# Patient Record
Sex: Male | Born: 1970
Health system: Southern US, Community
[De-identification: ages and names within clinical notes are randomized; demographics above are authoritative.]

## PROBLEM LIST (undated history)

## (undated) DIAGNOSIS — R0602 Shortness of breath: Secondary | ICD-10-CM

## (undated) DIAGNOSIS — J45909 Unspecified asthma, uncomplicated: Secondary | ICD-10-CM

## (undated) DIAGNOSIS — M25511 Pain in right shoulder: Secondary | ICD-10-CM

## (undated) DIAGNOSIS — J449 Chronic obstructive pulmonary disease, unspecified: Secondary | ICD-10-CM

## (undated) DIAGNOSIS — M25512 Pain in left shoulder: Secondary | ICD-10-CM

## (undated) DIAGNOSIS — G8929 Other chronic pain: Secondary | ICD-10-CM

## (undated) HISTORY — DX: Other chronic pain: G89.29

## (undated) HISTORY — PX: HERNIA REPAIR: SHX51

## (undated) HISTORY — PX: ABDOMINAL SURGERY: SHX537

## (undated) HISTORY — DX: Shortness of breath: R06.02

## (undated) HISTORY — DX: Pain in right shoulder: M25.511

## (undated) HISTORY — DX: Pain in left shoulder: M25.512

---

## 1999-03-28 ENCOUNTER — Emergency Department (HOSPITAL_COMMUNITY): Admission: EM | Admit: 1999-03-28 | Discharge: 1999-03-28 | Payer: Self-pay | Admitting: *Deleted

## 1999-03-28 ENCOUNTER — Encounter: Payer: Self-pay | Admitting: *Deleted

## 2002-05-23 ENCOUNTER — Encounter (HOSPITAL_BASED_OUTPATIENT_CLINIC_OR_DEPARTMENT_OTHER): Payer: Self-pay | Admitting: General Surgery

## 2002-05-28 ENCOUNTER — Ambulatory Visit (HOSPITAL_COMMUNITY): Admission: RE | Admit: 2002-05-28 | Discharge: 2002-05-28 | Payer: Self-pay | Admitting: General Surgery

## 2012-03-24 ENCOUNTER — Emergency Department (HOSPITAL_COMMUNITY): Payer: Self-pay

## 2012-03-24 ENCOUNTER — Encounter (HOSPITAL_COMMUNITY): Payer: Self-pay | Admitting: Emergency Medicine

## 2012-03-24 ENCOUNTER — Emergency Department (HOSPITAL_COMMUNITY)
Admission: EM | Admit: 2012-03-24 | Discharge: 2012-03-24 | Disposition: A | Discharge status: Home / Self Care | Payer: Self-pay | Attending: Emergency Medicine | Admitting: Emergency Medicine

## 2012-03-24 DIAGNOSIS — J449 Chronic obstructive pulmonary disease, unspecified: Secondary | ICD-10-CM | POA: Insufficient documentation

## 2012-03-24 DIAGNOSIS — F17200 Nicotine dependence, unspecified, uncomplicated: Secondary | ICD-10-CM | POA: Insufficient documentation

## 2012-03-24 DIAGNOSIS — R062 Wheezing: Secondary | ICD-10-CM | POA: Insufficient documentation

## 2012-03-24 DIAGNOSIS — R05 Cough: Secondary | ICD-10-CM | POA: Insufficient documentation

## 2012-03-24 DIAGNOSIS — J4489 Other specified chronic obstructive pulmonary disease: Secondary | ICD-10-CM | POA: Insufficient documentation

## 2012-03-24 DIAGNOSIS — R059 Cough, unspecified: Secondary | ICD-10-CM | POA: Insufficient documentation

## 2012-03-24 LAB — CBC WITH DIFFERENTIAL/PLATELET
Basophils Absolute: 0.1 10*3/uL (ref 0.0–0.1)
HCT: 43.1 % (ref 39.0–52.0)
Hemoglobin: 15.1 g/dL (ref 13.0–17.0)
Lymphocytes Relative: 29 % (ref 12–46)
Monocytes Absolute: 0.6 10*3/uL (ref 0.1–1.0)
Neutro Abs: 3.6 10*3/uL (ref 1.7–7.7)
RDW: 12.8 % (ref 11.5–15.5)
WBC: 6.9 10*3/uL (ref 4.0–10.5)

## 2012-03-24 LAB — POCT I-STAT, CHEM 8
Calcium, Ion: 1.13 mmol/L (ref 1.12–1.23)
Creatinine, Ser: 1.2 mg/dL (ref 0.50–1.35)
Glucose, Bld: 93 mg/dL (ref 70–99)
Hemoglobin: 15 g/dL (ref 13.0–17.0)
Sodium: 142 mEq/L (ref 135–145)
TCO2: 25 mmol/L (ref 0–100)

## 2012-03-24 LAB — RAPID URINE DRUG SCREEN, HOSP PERFORMED
Amphetamines: NOT DETECTED
Benzodiazepines: NOT DETECTED

## 2012-03-24 LAB — POCT I-STAT TROPONIN I

## 2012-03-24 MED ORDER — IPRATROPIUM BROMIDE 0.02 % IN SOLN
0.5000 mg | Freq: Once | RESPIRATORY_TRACT | Status: AC
  Administered 2012-03-24: 0.5 mg via RESPIRATORY_TRACT
  Filled 2012-03-24: qty 2.5

## 2012-03-24 MED ORDER — ALBUTEROL SULFATE (5 MG/ML) 0.5% IN NEBU
5.0000 mg | INHALATION_SOLUTION | Freq: Once | RESPIRATORY_TRACT | Status: AC
  Administered 2012-03-24: 5 mg via RESPIRATORY_TRACT
  Filled 2012-03-24: qty 1

## 2012-03-24 MED ORDER — FLUTICASONE PROPIONATE HFA 110 MCG/ACT IN AERO: 2.0000 | INHALATION_SPRAY | Freq: Two times a day (BID) | RESPIRATORY_TRACT | Status: DC

## 2012-03-24 MED ORDER — PREDNISONE 50 MG PO TABS: 50.0000 mg | ORAL_TABLET | Freq: Every day | ORAL | Status: DC

## 2012-03-24 MED ORDER — METHYLPREDNISOLONE SODIUM SUCC 125 MG IJ SOLR
125.0000 mg | Freq: Once | INTRAMUSCULAR | Status: AC
  Administered 2012-03-24: 125 mg via INTRAVENOUS
  Filled 2012-03-24: qty 2

## 2012-03-24 MED ORDER — ALBUTEROL SULFATE HFA 108 (90 BASE) MCG/ACT IN AERS: 1.0000 | INHALATION_SPRAY | Freq: Four times a day (QID) | RESPIRATORY_TRACT | Status: DC | PRN

## 2012-03-24 NOTE — ED Provider Notes (Addendum)
History     CSN: [id number]  Arrival date & time 03/24/12  4098   First MD Initiated Contact with Patient 03/24/12 0117      Chief Complaint  Patient presents with  . Chest Pain    (Consider location/radiation/quality/duration/timing/severity/associated sxs/prior treatment) Patient is a 41 y.o. male presenting with chest pain and wheezing. The history is provided by the patient.  Chest Pain The chest pain began 3 - 5 hours ago. Chest pain occurs constantly. The chest pain is unchanged. The pain is associated with coughing. The severity of the pain is severe. The quality of the pain is described as tightness. The pain does not radiate. Primary symptoms include cough and wheezing. Pertinent negatives for primary symptoms include no fever.  Pertinent negatives for associated symptoms include no claudication, no diaphoresis and no numbness. He tried beta-agonist inhalers for the symptoms. Risk factors include male gender.  Pertinent negatives for past medical history include no MI.  Procedure history is negative for cardiac catheterization.    Wheezing  The current episode started 5 to 7 days ago. The onset was sudden. The problem occurs continuously. The problem has been gradually worsening. The problem is moderate. The symptoms are relieved by beta-agonist inhalers. Associated symptoms include chest pain, cough and wheezing. Pertinent negatives include no fever and no rhinorrhea. He was not exposed to toxic fumes. Urine output has been normal. There were no sick contacts. He has received no recent medical care.    History reviewed. No pertinent past medical history.  Past Surgical History  Procedure Date  . Abdominal surgery   . Hernia repair     History reviewed. No pertinent family history.  History  Substance Use Topics  . Smoking status: Current Every Day Smoker -- 0.5 packs/day    Types: Cigarettes  . Smokeless tobacco: Not on file  . Alcohol Use: Yes     Comment:  occasion      Review of Systems  Constitutional: Negative for fever and diaphoresis.  HENT: Negative for rhinorrhea and neck pain.   Respiratory: Positive for cough and wheezing.   Cardiovascular: Positive for chest pain. Negative for claudication.  Neurological: Negative for numbness.  All other systems reviewed and are negative.    Allergies  Review of patient's allergies indicates no known allergies.  Home Medications  No current outpatient prescriptions on file.  BP 127/80  Pulse 87  Temp 98.2 F (36.8 C)  Resp 14  SpO2 100%  Physical Exam  Constitutional: He is oriented to person, place, and time. He appears well-developed and well-nourished. No distress.  HENT:  Head: Normocephalic and atraumatic.  Mouth/Throat: Oropharynx is clear and moist.  Eyes: Conjunctivae normal are normal. Pupils are equal, round, and reactive to light.  Neck: Normal range of motion. Neck supple.  Cardiovascular: Normal rate and regular rhythm.   Pulmonary/Chest: No stridor. He has wheezes.  Abdominal: Soft. Bowel sounds are normal. There is no tenderness. There is no rebound and no guarding.  Musculoskeletal: Normal range of motion.  Neurological: He is alert and oriented to person, place, and time.  Skin: Skin is warm and dry. He is not diaphoretic.  Psychiatric: He has a normal mood and affect.    ED Course  Procedures (including critical care time)  Labs Reviewed  CBC WITH DIFFERENTIAL - Abnormal; Notable for the following:    Eosinophils Relative 9 (*)     Basophils Relative 2 (*)     All other components within normal limits  POCT I-STAT, CHEM 8  POCT I-STAT TROPONIN I  URINE RAPID DRUG SCREEN (HOSP PERFORMED)   No results found.   No diagnosis found.    MDM  PERC negative and wells 0    Date: 03/24/2012  Rate:88  Rhythm: normal sinus rhythm  QRS Axis: normal  Intervals: normal  ST/T Wave abnormalities: normal  Conduction Disutrbances: none  Narrative  Interpretation: unremarkable   In the setting of symptoms with negative EKG and 2 negative troponins ACS excluded.  Sx consistent with COPD exacerbation.  Stop tobacco and marijiuana.  Will d/c with inhalers and steroids.  Return for fevers, chest pain shortness of breath or any concerns.  Patient states he feels improved post nebs.       Jasmine Awe, MD 03/24/12 0244  Krystalle Pilkington K Nailyn Dearinger-Rasch, MD 03/24/12 [phone]

## 2012-03-24 NOTE — ED Notes (Signed)
Reports having CP described as tightness since Tuesday has gotten increasingly worse; reports SOB, a/w n/v/diaphoresi

## 2016-02-23 ENCOUNTER — Emergency Department (HOSPITAL_COMMUNITY): Payer: Self-pay

## 2016-02-23 ENCOUNTER — Encounter (HOSPITAL_COMMUNITY): Payer: Self-pay

## 2016-02-23 ENCOUNTER — Emergency Department (HOSPITAL_COMMUNITY)
Admission: EM | Admit: 2016-02-23 | Discharge: 2016-02-23 | Disposition: A | Discharge status: Home / Self Care | Payer: Self-pay | Attending: Emergency Medicine | Admitting: Emergency Medicine

## 2016-02-23 DIAGNOSIS — F17210 Nicotine dependence, cigarettes, uncomplicated: Secondary | ICD-10-CM | POA: Insufficient documentation

## 2016-02-23 DIAGNOSIS — J45901 Unspecified asthma with (acute) exacerbation: Secondary | ICD-10-CM | POA: Insufficient documentation

## 2016-02-23 MED ORDER — ALBUTEROL SULFATE (2.5 MG/3ML) 0.083% IN NEBU
5.0000 mg | INHALATION_SOLUTION | Freq: Once | RESPIRATORY_TRACT | Status: AC
  Administered 2016-02-23: 5 mg via RESPIRATORY_TRACT
  Filled 2016-02-23: qty 6

## 2016-02-23 MED ORDER — ALBUTEROL SULFATE HFA 108 (90 BASE) MCG/ACT IN AERS
1.0000 | INHALATION_SPRAY | Freq: Once | RESPIRATORY_TRACT | Status: AC
  Administered 2016-02-23: 1 via RESPIRATORY_TRACT
  Filled 2016-02-23: qty 6.7

## 2016-02-23 NOTE — ED Triage Notes (Addendum)
Pt c/o SOB, productive cough, and nasal congestion x 5 days.  Pain score 7/10.  Pt c/o chest tightness w/ coughing and movement.  Pt has not been taking anything for symptoms.  Hx of smoking.      Pt reports similar symptoms previously.  Sts "they gave me a breathing treatment and I felt better.  This seems to happen w/ weather changes."

## 2016-02-23 NOTE — Discharge Instructions (Signed)
Please read attached information. If you experience any new or worsening signs or symptoms please return to the emergency room for evaluation. Please follow-up with your primary care provider or specialist as discussed. Please use medication prescribed only as directed and discontinue taking if you have any concerning signs or symptoms.   °

## 2016-02-23 NOTE — ED Provider Notes (Signed)
[facility]-EMERGENCY DEPT Provider Note   CSN: [id number] Arrival date & time: 02/23/16  1303  By signing my name below, I, [name], attest that this documentation has been prepared under the direction and in the presence of Eyvonne Mechanic, PA-C  Electronically Signed: [name], ED Scribe. 02/23/16. 1:54 PM.    History   Chief Complaint Chief Complaint  Patient presents with  . Shortness of Breath    The history is provided by the patient. No language interpreter was used.   HPI Comments: LADAMIEN Aaron [name] is a 45 y.o. male who presents to the Emergency Department complaining of intermittent, moderate, gradual improving, shortness of breath x 5 days. He notes associated intermittent cough, rhinorrhea, subjective fever, and left sided chest tightness. Per family member, pt had an eyelash in his eye on Thursday. He notes that the eyelash started causing irritation and pt began to start coughing. Pt received breathing treatment to alleviate his symptoms with relief. He is a smoker of about 20 years. Denies abdominal pain, legs swelling, hx of DVT, or abdominal surgeries.  History reviewed. No pertinent past medical history.  There are no active problems to display for this patient.   Past Surgical History:  Procedure Laterality Date  . ABDOMINAL SURGERY    . HERNIA REPAIR         Home Medications    Prior to Admission medications   Medication Sig Start Date End Date Taking? Authorizing Provider  albuterol (PROVENTIL HFA;VENTOLIN HFA) 108 (90 BASE) MCG/ACT inhaler Inhale 1-2 puffs into the lungs every 6 (six) hours as needed for wheezing. 03/24/12   April Palumbo, MD  fluticasone (FLOVENT HFA) 110 MCG/ACT inhaler Inhale 2 puffs into the lungs 2 (two) times daily. 03/24/12   April Palumbo, MD  predniSONE (DELTASONE) 50 MG tablet Take 1 tablet (50 mg total) by mouth daily. 03/24/12   Cy Blamer, MD    Family History History reviewed. No pertinent family  history.  Social History Social History  Substance Use Topics  . Smoking status: Current Every Day Smoker    Packs/day: 0.50    Types: Cigarettes  . Smokeless tobacco: Never Used  . Alcohol use Yes     Comment: occasion     Allergies   Review of patient's allergies indicates no known allergies.   Review of Systems Review of Systems  Constitutional: Positive for fever (subjective).  HENT: Positive for rhinorrhea.   Respiratory: Positive for cough and shortness of breath.   Cardiovascular: Positive for chest pain (left sided chest tightness). Negative for leg swelling.  Gastrointestinal: Negative for abdominal pain.  All other systems reviewed and are negative.    Physical Exam Updated Vital Signs BP 148/77 (BP Location: Right Arm)   Pulse 86   Temp 98.4 F (36.9 C) (Oral)   Resp 18   SpO2 97%   Physical Exam  Constitutional: He is oriented to person, place, and time. He appears well-developed and well-nourished.  HENT:  Head: Normocephalic.  Eyes: EOM are normal.  Neck: Normal range of motion.  Cardiovascular: Normal rate.   Pulmonary/Chest: Effort normal.  Abdominal: He exhibits no distension.  Musculoskeletal: Normal range of motion.  Neurological: He is alert and oriented to person, place, and time.  Psychiatric: He has a normal mood and affect.  Nursing note and vitals reviewed.    ED Treatments / Results  DIAGNOSTIC STUDIES: Oxygen Saturation is 98% on RA, normal by my interpretation.  COORDINATION OF CARE:  1:49 PM Discussed treatment plan  with pt at bedside and pt agreed to plan.  Labs (all labs ordered are listed, but only abnormal results are displayed) Labs Reviewed - No data to display  EKG  EKG Interpretation None       Radiology Dg Chest 2 View  Result Date: 02/23/2016 CLINICAL DATA:  Shortness of breath, smoker EXAM: CHEST  2 VIEW COMPARISON:  03/24/2012 FINDINGS: The heart size and mediastinal contours are within normal limits.  Both lungs are clear. The visualized skeletal structures are unremarkable. Mild hyperinflation. IMPRESSION: No active cardiopulmonary disease.  Mild hyperinflation. Electronically Signed   By: Natasha MeadLiviu  Pop M.D.   On: 02/23/2016 13:48    Procedures Procedures (including critical care time)  Medications Ordered in ED Medications  albuterol (PROVENTIL) (2.5 MG/3ML) 0.083% nebulizer solution 5 mg (5 mg Nebulization Given 02/23/16 1331)  albuterol (PROVENTIL HFA;VENTOLIN HFA) 108 (90 Base) MCG/ACT inhaler 1-2 puff (1 puff Inhalation Given 02/23/16 1404)     Initial Impression / Assessment and Plan / ED Course  I have reviewed the triage vital signs and the nursing notes.  Pertinent labs & imaging results that were available during my care of the patient were reviewed by me and considered in my medical decision making (see chart for details).  Clinical Course      Final Clinical Impressions(s) / ED Diagnoses   Final diagnoses:  Mild asthma with exacerbation, unspecified whether persistent   Labs:   Imaging: DG Chest 2 View  Consults:  Therapeutics: Albuterol treatment  Discharge Meds: albuterol inhaler  Assessment/Plan: Patient presents with complaints of shortness of breath. Patient's lungs clear on my exam, he is not tachypnea, and reports no shortness of breath after breathing treatment. Patient has no infectious etiology, as no significant findings that would indicate DVT PE, or any significant life-threatening etiology. Pt was advised to stop smoking. Informed to return if his symptoms get worse.  I personally performed the services described in this documentation, which was scribed in my presence. The recorded information has been reviewed and is accurate.   New Prescriptions Discharge Medication List as of 02/23/2016  1:55 PM       Eyvonne MechanicJeffrey Tyreesha Maharaj, PA-C 02/23/16 1553    Nira ConnPedro Eduardo Cardama, MD 02/23/16 2150

## 2016-02-25 ENCOUNTER — Encounter (HOSPITAL_COMMUNITY): Payer: Self-pay | Admitting: Emergency Medicine

## 2016-02-25 ENCOUNTER — Emergency Department (HOSPITAL_COMMUNITY): Payer: Self-pay

## 2016-02-25 ENCOUNTER — Emergency Department (HOSPITAL_COMMUNITY)
Admission: EM | Admit: 2016-02-25 | Discharge: 2016-02-25 | Disposition: A | Discharge status: Home / Self Care | Payer: Self-pay | Attending: Emergency Medicine | Admitting: Emergency Medicine

## 2016-02-25 DIAGNOSIS — R05 Cough: Secondary | ICD-10-CM | POA: Insufficient documentation

## 2016-02-25 DIAGNOSIS — R000 Tachycardia, unspecified: Secondary | ICD-10-CM | POA: Insufficient documentation

## 2016-02-25 DIAGNOSIS — R0602 Shortness of breath: Secondary | ICD-10-CM | POA: Insufficient documentation

## 2016-02-25 DIAGNOSIS — R062 Wheezing: Secondary | ICD-10-CM

## 2016-02-25 DIAGNOSIS — J45909 Unspecified asthma, uncomplicated: Secondary | ICD-10-CM | POA: Insufficient documentation

## 2016-02-25 DIAGNOSIS — F17210 Nicotine dependence, cigarettes, uncomplicated: Secondary | ICD-10-CM | POA: Insufficient documentation

## 2016-02-25 DIAGNOSIS — R0981 Nasal congestion: Secondary | ICD-10-CM | POA: Insufficient documentation

## 2016-02-25 LAB — CBC WITH DIFFERENTIAL/PLATELET
Basophils Absolute: 0.1 10*3/uL (ref 0.0–0.1)
Basophils Relative: 1 %
EOS ABS: 0.6 10*3/uL (ref 0.0–0.7)
EOS PCT: 9 %
HCT: 46.9 % (ref 39.0–52.0)
HEMOGLOBIN: 16.1 g/dL (ref 13.0–17.0)
LYMPHS ABS: 2.2 10*3/uL (ref 0.7–4.0)
LYMPHS PCT: 32 %
MCH: 31.4 pg (ref 26.0–34.0)
MCHC: 34.3 g/dL (ref 30.0–36.0)
MCV: 91.4 fL (ref 78.0–100.0)
MONOS PCT: 10 %
Monocytes Absolute: 0.7 10*3/uL (ref 0.1–1.0)
Neutro Abs: 3.3 10*3/uL (ref 1.7–7.7)
Neutrophils Relative %: 48 %
Platelets: 211 10*3/uL (ref 150–400)
RBC: 5.13 MIL/uL (ref 4.22–5.81)
RDW: 13.1 % (ref 11.5–15.5)
WBC: 6.8 10*3/uL (ref 4.0–10.5)

## 2016-02-25 LAB — BASIC METABOLIC PANEL
ANION GAP: 9 (ref 5–15)
BUN: 14 mg/dL (ref 6–20)
CALCIUM: 9.5 mg/dL (ref 8.9–10.3)
CO2: 27 mmol/L (ref 22–32)
CREATININE: 1.04 mg/dL (ref 0.61–1.24)
Chloride: 105 mmol/L (ref 101–111)
GLUCOSE: 100 mg/dL — AB (ref 65–99)
Potassium: 3.7 mmol/L (ref 3.5–5.1)
Sodium: 141 mmol/L (ref 135–145)

## 2016-02-25 LAB — D-DIMER, QUANTITATIVE: D-Dimer, Quant: <0.27 ug/mL-FEU (ref 0.00–0.50)

## 2016-02-25 LAB — TROPONIN I: Troponin I: <0.03 ng/mL (ref <0.03)

## 2016-02-25 MED ORDER — ALBUTEROL SULFATE (2.5 MG/3ML) 0.083% IN NEBU
5.0000 mg | INHALATION_SOLUTION | Freq: Once | RESPIRATORY_TRACT | Status: AC
  Administered 2016-02-25: 5 mg via RESPIRATORY_TRACT

## 2016-02-25 MED ORDER — ALBUTEROL SULFATE (2.5 MG/3ML) 0.083% IN NEBU
INHALATION_SOLUTION | RESPIRATORY_TRACT | Status: AC
  Filled 2016-02-25: qty 6

## 2016-02-25 MED ORDER — PREDNISONE 20 MG PO TABS: 40.0000 mg | ORAL_TABLET | Freq: Every day | ORAL | 0 refills | Status: DC

## 2016-02-25 MED ORDER — ALBUTEROL (5 MG/ML) CONTINUOUS INHALATION SOLN
10.0000 mg/h | INHALATION_SOLUTION | RESPIRATORY_TRACT | Status: DC
  Administered 2016-02-25: 10 mg/h via RESPIRATORY_TRACT
  Filled 2016-02-25: qty 20

## 2016-02-25 MED ORDER — IPRATROPIUM BROMIDE 0.02 % IN SOLN
0.5000 mg | Freq: Once | RESPIRATORY_TRACT | Status: AC
  Administered 2016-02-25: 0.5 mg via RESPIRATORY_TRACT
  Filled 2016-02-25: qty 2.5

## 2016-02-25 MED ORDER — PREDNISONE 20 MG PO TABS
60.0000 mg | ORAL_TABLET | Freq: Once | ORAL | Status: AC
  Administered 2016-02-25: 60 mg via ORAL
  Filled 2016-02-25: qty 3

## 2016-02-25 NOTE — Discharge Instructions (Signed)
You need to follow up with primary care doctor. Please return to the ED if you develop chest pain, worsening shortness of breath, fever or for any other reason.

## 2016-02-25 NOTE — ED Triage Notes (Signed)
Pt is c/o shortness of breath and wheezing. Resp rate 28 and irregular.  Pt reports that he started wheezing at home. Took multiple breathing treatment at home, reports no decrease in wheezing. Currently has bilateral expiratory wheezing, anterior and posterior. Breathing treatment in progress.

## 2016-02-25 NOTE — Progress Notes (Signed)
EDCM spoke to patient at bedside. Patient confirms he does not have a pcp or insurance living in [place].  EDCM provided patient with contact information to [facility], informed patient of services there and walk in times.  EDCM also provided patient with list of pcps who accept self pay patients, list of discount pharmacies and websites needymeds.org and GoodRX.com for medication assistance, phone number to inquire about the orange card, phone number to inquire about Medicaid, phone number to inquire about the Affordable Care Act, financial resources in the community such as local churches, [employer], urban ministries, and dental assistance for uninsured patients.  Patient thankful for resources.  No further EDCM needs at this time. 

## 2016-02-25 NOTE — ED Notes (Signed)
Ambulated pt greater than 40 ft. Sats dropped for less than a second to 87% however question that it was a poor wave form vs true desat. Pt otherwise maintained a sat of 96% and pt denied SOB with activity. PA made aware and requesting the pt ambulate again.

## 2016-02-25 NOTE — ED Notes (Signed)
Bed: [facility] Expected date:  Expected time:  Means of arrival:  Comments: 

## 2016-02-25 NOTE — ED Provider Notes (Signed)
[facility]PT Provider Note   CSN: [id number] Arrival date & time: 02/25/16  1620     History   Chief Complaint Chief Complaint  Patient presents with  . Shortness of Breath    recurrent shortness of breath    HPI Aaron [name] is a 45 y.o. male.  45 year old African-American male with no significant past medical history presents to the ED today with wheezing. Patient states that he was at work today and started having shortness of breath and wheezing. He used his albuterol inhaler at home alone relief. Patient was seen approximately 3 days ago in ED for same. He was diagnosed with asthma exacerbation and given a albuterol inhaler. He was instructed to follow up with primary care doctor. Patient states that he does not have a PCP and has not seen anybody for his asthma. Patient states that when he was seen 3 days ago he had rhinorrhea, cough, congestion that time. However patient states that his uri symptoms have improved slightly. Still reports a productive cough with small amount of white mucus. States it seems hard to catch a breath.  Patient denies any fever, chills, headache, lightheadedness, dizziness, cp, abd pain, nausea, emesis, urinary signs, change in bowel habits, numbness/tingling. Patient is a half a pack a day smoker. He has never diagnosed with asthma or any other lung disease.      History reviewed. No pertinent past medical history.  There are no active problems to display for this patient.   Past Surgical History:  Procedure Laterality Date  . ABDOMINAL SURGERY    . HERNIA REPAIR         Home Medications    Prior to Admission medications   Medication Sig Start Date End Date Taking? Authorizing Provider  albuterol (PROVENTIL HFA;VENTOLIN HFA) 108 (90 BASE) MCG/ACT inhaler Inhale 1-2 puffs into the lungs every 6 (six) hours as needed for wheezing. Patient not taking: Reported on 02/25/2016 03/24/12   April Palumbo, MD  fluticasone [facility] HFA)  110 MCG/ACT inhaler Inhale 2 puffs into the lungs 2 (two) times daily. Patient not taking: Reported on 02/25/2016 03/24/12   April Palumbo, MD  predniSONE (DELTASONE) 20 MG tablet Take 2 tablets (40 mg total) by mouth daily with breakfast. 02/25/16   Rise Mu, PA-C    Family History Family History  Problem Relation Age of Onset  . Diabetes Mother   . Hypertension Father     Social History Social History  Substance Use Topics  . Smoking status: Current Every Day Smoker    Packs/day: 0.50    Types: Cigarettes  . Smokeless tobacco: Never Used  . Alcohol use Yes     Comment: occasion     Allergies   Review of patient's allergies indicates no known allergies.   Review of Systems Review of Systems  Constitutional: Negative for chills and fever.  HENT: Positive for congestion (mild). Negative for rhinorrhea and sore throat.   Eyes: Negative.   Respiratory: Positive for cough (mild), shortness of breath and wheezing.   Cardiovascular: Negative for chest pain, palpitations and leg swelling.  Gastrointestinal: Negative for abdominal pain, diarrhea, nausea and vomiting.  Genitourinary: Negative.   Musculoskeletal: Negative.   Skin: Negative.   Neurological: Negative for dizziness, syncope, weakness, light-headedness, numbness and headaches.  All other systems reviewed and are negative.    Physical Exam Updated Vital Signs BP 123/73 (BP Location: Left Arm)   Pulse 119   Temp 98.2 F (36.8 C)   Resp 20  Wt 81.6 kg   SpO2 94%   Physical Exam  Constitutional: He appears well-developed and well-nourished. No distress.  HENT:  Head: Normocephalic and atraumatic.  Right Ear: Tympanic membrane, external ear and ear canal normal.  Left Ear: Tympanic membrane, external ear and ear canal normal.  Nose: Nose normal. Right sinus exhibits no maxillary sinus tenderness and no frontal sinus tenderness. Left sinus exhibits no maxillary sinus tenderness and no frontal sinus  tenderness.  Mouth/Throat: Uvula is midline, oropharynx is clear and moist and mucous membranes are normal. No oropharyngeal exudate, posterior oropharyngeal edema or posterior oropharyngeal erythema.  Eyes: Conjunctivae are normal. Right eye exhibits no discharge. Left eye exhibits no discharge. No scleral icterus.  Neck: Normal range of motion. Neck supple. No thyromegaly present.  Cardiovascular: Normal rate, regular rhythm, normal heart sounds and intact distal pulses.  Exam reveals no gallop and no friction rub.   No murmur heard. Pulmonary/Chest: Effort normal. No tachypnea. No respiratory distress. He has no decreased breath sounds. He has wheezes (inspiratory and expiratory wheezes). He has no rhonchi. He has no rales.  Abdominal: Soft. Bowel sounds are normal. He exhibits no distension. There is no tenderness. There is no rebound and no guarding.  Musculoskeletal: Normal range of motion.  Lymphadenopathy:    He has no cervical adenopathy.  Neurological: He is alert.  Skin: Skin is warm and dry. Capillary refill takes less than 2 seconds.  Nursing note and vitals reviewed.    ED Treatments / Results  Labs (all labs ordered are listed, but only abnormal results are displayed) Labs Reviewed  BASIC METABOLIC PANEL - Abnormal; Notable for the following:       Result Value   Glucose, Bld 100 (*)    All other components within normal limits  CBC WITH DIFFERENTIAL/PLATELET  D-DIMER, QUANTITATIVE (NOT AT [facility])  TROPONIN I    EKG  EKG Interpretation None       Radiology Dg Chest 2 View  Result Date: 02/25/2016 CLINICAL DATA:  Shortness of breath.  Chest tightness. EXAM: CHEST  2 VIEW COMPARISON:  02/23/2016.  03/24/2012. FINDINGS: Mediastinum and hilar structures are normal. Lungs are clear. Heart size normal. Left base pleural thickening noted consistent with scarring. No pneumothorax. IMPRESSION: Left base pleural thickening noted consistent with scarring. Electronically  Signed   By: Maisie Fus  Register   On: 02/25/2016 16:57    Procedures Procedures (including critical care time)  Medications Ordered in ED Medications  albuterol (PROVENTIL,VENTOLIN) solution continuous neb (0 mg/hr Nebulization Stopped 02/25/16 1915)  albuterol (PROVENTIL) (2.5 MG/3ML) 0.083% nebulizer solution 5 mg (5 mg Nebulization Given 02/25/16 1637)  ipratropium (ATROVENT) nebulizer solution 0.5 mg (0.5 mg Nebulization Given 02/25/16 1805)  predniSONE (DELTASONE) tablet 60 mg (60 mg Oral Given 02/25/16 1752)     Initial Impression / Assessment and Plan / ED Course  I have reviewed the triage vital signs and the nursing notes.  Pertinent labs & imaging results that were available during my care of the patient were reviewed by me and considered in my medical decision making (see chart for details).  Clinical Course  Patient ambulated in ED with O2 saturations maintained >90, no current signs of respiratory distress. I walked patient myself and spo2 95-98%. His resp were 18-20. Patient was slightly tachycardic at 105-110 but he just finished is albuterol neb which would explain his tachycardia. D-dimer and troponin was negative. Patient is without any cp. Heart pathway score is 1. EKG with poor R wave progression, but  no acute changes as reviewed by Dr. Fayrene FearingJames. Troponin was negative. Low suspicion for ACS. CXR was without any infiltrate. Mild scarring noted in the left lower lobe. No acute changes. No leukocytosis. Patient is afebrile and low suspicion for infectious process at this time. Lung exam improved after nebulizer treatment. Prednisone given in the ED and pt will bd dc with 5 day burst. Pt states they are breathing at baseline. Pt has been instructed to continue using prescribed medications and to speak with PCP about today's exacerbation and further workup. I have given strict return precautions to the ED. I have encouraged him to continue to use his albuterol inhaler at home. He needs  to follow up with a pcp concerning wheezing and ekg. Patient verbalized understanding with plan of care. He is hemodynamically stable and will be discharged home in NAD with stable vs. I have counseled patient on importance of smoking cessation.      Final Clinical Impressions(s) / ED Diagnoses   Final diagnoses:  SOB (shortness of breath)  Wheezing    New Prescriptions New Prescriptions   PREDNISONE (DELTASONE) 20 MG TABLET    Take 2 tablets (40 mg total) by mouth daily with breakfast.     Rise MuKenneth T Leaphart, PA-C 02/26/16 [id number]    Rolland PorterMark James, MD 03/07/16 1556

## 2017-01-09 ENCOUNTER — Encounter (HOSPITAL_COMMUNITY): Payer: Self-pay

## 2017-01-09 ENCOUNTER — Emergency Department (HOSPITAL_COMMUNITY): Payer: Self-pay

## 2017-01-09 ENCOUNTER — Emergency Department (HOSPITAL_COMMUNITY)
Admission: EM | Admit: 2017-01-09 | Discharge: 2017-01-09 | Disposition: A | Discharge status: Home / Self Care | Payer: Self-pay | Attending: Emergency Medicine | Admitting: Emergency Medicine

## 2017-01-09 DIAGNOSIS — F17210 Nicotine dependence, cigarettes, uncomplicated: Secondary | ICD-10-CM | POA: Insufficient documentation

## 2017-01-09 DIAGNOSIS — Z79899 Other long term (current) drug therapy: Secondary | ICD-10-CM | POA: Insufficient documentation

## 2017-01-09 DIAGNOSIS — J45901 Unspecified asthma with (acute) exacerbation: Secondary | ICD-10-CM | POA: Insufficient documentation

## 2017-01-09 HISTORY — DX: Unspecified asthma, uncomplicated: J45.909

## 2017-01-09 MED ORDER — ALBUTEROL SULFATE HFA 108 (90 BASE) MCG/ACT IN AERS
1.0000 | INHALATION_SPRAY | Freq: Four times a day (QID) | RESPIRATORY_TRACT | Status: DC
  Administered 2017-01-09: 2 via RESPIRATORY_TRACT
  Filled 2017-01-09: qty 6.7

## 2017-01-09 MED ORDER — IPRATROPIUM-ALBUTEROL 0.5-2.5 (3) MG/3ML IN SOLN
3.0000 mL | Freq: Once | RESPIRATORY_TRACT | Status: AC
  Administered 2017-01-09: 3 mL via RESPIRATORY_TRACT
  Filled 2017-01-09: qty 3

## 2017-01-09 MED ORDER — IPRATROPIUM-ALBUTEROL 0.5-2.5 (3) MG/3ML IN SOLN
3.0000 mL | Freq: Once | RESPIRATORY_TRACT | Status: AC
Start: 2017-01-09 — End: 2017-01-09
  Administered 2017-01-09: 3 mL via RESPIRATORY_TRACT
  Filled 2017-01-09: qty 3

## 2017-01-09 MED ORDER — PREDNISONE 20 MG PO TABS: 40.0000 mg | ORAL_TABLET | Freq: Every day | ORAL | 0 refills | Status: AC

## 2017-01-09 MED ORDER — PREDNISONE 20 MG PO TABS
40.0000 mg | ORAL_TABLET | Freq: Once | ORAL | Status: AC
  Administered 2017-01-09: 40 mg via ORAL
  Filled 2017-01-09: qty 2

## 2017-01-09 NOTE — ED Triage Notes (Signed)
Patient presents with c/o asthma exacerbation. Patient reports starting to feel short of breath 2 days ago. Patient states he has been using his home inhaler and it has not made the shortness of breath go away. Patient endorses a productive cough with clear sputum. Denies nausea/vomiting/fever/chills.

## 2017-01-09 NOTE — Discharge Instructions (Signed)
No signs of pneumonia on your chest x-ray  Please take albuterol every 6hrs as needed for wheezing/shortness of breath.   Please fill prescription for prednisone and take 2 tablets once a day for the next 4 days.   Please return to the ER if you have worsening shortness of breath or difficulty breathing

## 2017-01-09 NOTE — ED Provider Notes (Signed)
[facility]-EMERGENCY DEPT Provider Note   CSN: [id number] Arrival date & time: 01/09/17  1108     History   Chief Complaint Chief Complaint  Patient presents with  . Asthma    HPI Aaron [name] is a 46 y.o. male.  HPI   Aaron [name] is a 46 year old male with a history of tobacco use (25pk/yrs), asthma who presents to the emergency department for evaluation of shortness of breath for the last 4 days. He states that he typically uses his albuterol inhaler once a week, mostly using it when he exercises. He works as a Administratorlandscaper and noticed that 4 days ago he was getting short of breath with increased wheezing and cough as he was working. Since that time he has had to use his albuterol inhaler 6-10 times a day noting only short term relief with the inhaler. He has also had difficulty sleeping, noting that the cough is worse when he lies flat. His cough is productive with clear/white sputum, no hemoptysis. He states that his last asthma exacerbation was about 9 months ago. He thinks that his asthma has gotten worse due to recent cold weather and the pollen from landscaping. He denies fever, chest pain, leg swelling, rhinorrhea, sore throat.   Past Medical History:  Diagnosis Date  . Asthma     There are no active problems to display for this patient.   Past Surgical History:  Procedure Laterality Date  . ABDOMINAL SURGERY    . HERNIA REPAIR         Home Medications    Prior to Admission medications   Medication Sig Start Date End Date Taking? Authorizing Provider  albuterol (PROVENTIL HFA;VENTOLIN HFA) 108 (90 BASE) MCG/ACT inhaler Inhale 1-2 puffs into the lungs every 6 (six) hours as needed for wheezing. 03/24/12  Yes Palumbo, April, MD  fluticasone (FLOVENT HFA) 110 MCG/ACT inhaler Inhale 2 puffs into the lungs 2 (two) times daily. Patient not taking: Reported on 02/25/2016 03/24/12   Palumbo, April, MD  predniSONE (DELTASONE) 20 MG tablet Take 2 tablets (40 mg total) by mouth  daily. 01/09/17 01/13/17  Kellie ShropshireShrosbree, Essam Lowdermilk J, PA-C    Family History Family History  Problem Relation Age of Onset  . Diabetes Mother   . Hypertension Father     Social History Social History  Substance Use Topics  . Smoking status: Current Every Day Smoker    Packs/day: 0.50    Types: Cigarettes  . Smokeless tobacco: Never Used  . Alcohol use Yes     Comment: occasion     Allergies   Patient has no known allergies.   Review of Systems Review of Systems  Constitutional: Negative for chills, fever and unexpected weight change.  HENT: Negative for rhinorrhea and sore throat.   Respiratory: Positive for cough, chest tightness, shortness of breath and wheezing.   Cardiovascular: Negative for chest pain, palpitations and leg swelling.  Gastrointestinal: Negative for abdominal pain.  Genitourinary: Negative for difficulty urinating.     Physical Exam Updated Vital Signs BP 132/89 (BP Location: Left Arm)   Pulse 89   Temp 98.1 F (36.7 C) (Oral)   Resp 18   Ht 5\' 10"  (1.778 m)   Wt 81.6 kg (180 lb)   SpO2 100%   BMI 25.83 kg/m   Physical Exam  Constitutional: He appears well-developed and well-nourished. No distress.  HENT:  Head: Normocephalic and atraumatic.  Nose: No rhinorrhea.  Mouth/Throat: Oropharynx is clear and moist.  Eyes: Pupils are equal, round,  and reactive to light. Conjunctivae are normal. Right eye exhibits no discharge. Left eye exhibits no discharge.  Cardiovascular: Normal rate, regular rhythm, normal heart sounds and intact distal pulses.  Exam reveals no gallop and no friction rub.   No murmur heard. Pulmonary/Chest: Effort normal.  Good air movement. Expiratory wheezing heard in all lung fields. No crackles auscultated on exam, no chest tenderness.   Abdominal: Soft. There is no tenderness.  Neurological: He is alert. Coordination normal.     Skin: Skin is warm and dry. He is not diaphoretic.  Psychiatric: He has a normal mood and  affect. His behavior is normal.  Nursing note and vitals reviewed.    ED Treatments / Results  Labs (all labs ordered are listed, but only abnormal results are displayed) Labs Reviewed - No data to display  EKG  EKG Interpretation None       Radiology Dg Chest 2 View  Result Date: 01/09/2017 CLINICAL DATA:  Asthma EXAM: CHEST  2 VIEW COMPARISON:  02/25/2016 FINDINGS: Heart and mediastinal contours are within normal limits. No focal opacities or effusions. No acute bony abnormality. IMPRESSION: No active cardiopulmonary disease. Electronically Signed   By: Charlett Nose M.D.   On: 01/09/2017 15:16    Procedures Procedures (including critical care time)  Medications Ordered in ED Medications  albuterol (PROVENTIL HFA;VENTOLIN HFA) 108 (90 Base) MCG/ACT inhaler 1-2 puff (not administered)  ipratropium-albuterol (DUONEB) 0.5-2.5 (3) MG/3ML nebulizer solution 3 mL (3 mLs Nebulization Given 01/09/17 1215)  predniSONE (DELTASONE) tablet 40 mg (40 mg Oral Given 01/09/17 1214)  ipratropium-albuterol (DUONEB) 0.5-2.5 (3) MG/3ML nebulizer solution 3 mL (3 mLs Nebulization Given 01/09/17 1300)     Initial Impression / Assessment and Plan / ED Course  I have reviewed the triage vital signs and the nursing notes.  Pertinent labs & imaging results that were available during my care of the patient were reviewed by me and considered in my medical decision making (see chart for details).    Patient present with asthma exacerbation. He was given two nebulizer treatments in the ER, with symptom improvement. CXR ordered, given there is no clear reason for his asthma exacerbation and to look for atypical pneumonia. Chest xray reviewed and shows no evidence of pneumonia, will not give an antibiotic at this time.   Patient ambulated in ED with O2 saturations maintained >90, no current signs of respiratory distress. Lung exam improved after nebulizer treatment. Prednisone given in the ED and pt will be  discharged with 5 day burst. Pt states they are breathing at baseline. Pt has been instructed to continue using prescribed medications and to speak with PCP about today's exacerbation. Counseled patient about tobacco cessation. Patient agrees with plan and voices understanding.    Final Clinical Impressions(s) / ED Diagnoses   Final diagnoses:  Exacerbation of asthma, unspecified asthma severity, unspecified whether persistent    New Prescriptions New Prescriptions   PREDNISONE (DELTASONE) 20 MG TABLET    Take 2 tablets (40 mg total) by mouth daily.     Kellie Shropshire, PA-C 01/09/17 1529    Rolland Porter, MD 01/16/17 [phone]

## 2017-02-01 ENCOUNTER — Emergency Department (HOSPITAL_COMMUNITY): Payer: Self-pay

## 2017-02-01 ENCOUNTER — Emergency Department (HOSPITAL_COMMUNITY)
Admission: EM | Admit: 2017-02-01 | Discharge: 2017-02-01 | Disposition: A | Discharge status: Home / Self Care | Payer: Self-pay | Attending: Emergency Medicine | Admitting: Emergency Medicine

## 2017-02-01 ENCOUNTER — Encounter (HOSPITAL_COMMUNITY): Payer: Self-pay | Admitting: Emergency Medicine

## 2017-02-01 DIAGNOSIS — F17210 Nicotine dependence, cigarettes, uncomplicated: Secondary | ICD-10-CM | POA: Insufficient documentation

## 2017-02-01 DIAGNOSIS — R05 Cough: Secondary | ICD-10-CM | POA: Insufficient documentation

## 2017-02-01 DIAGNOSIS — J4541 Moderate persistent asthma with (acute) exacerbation: Secondary | ICD-10-CM | POA: Insufficient documentation

## 2017-02-01 LAB — CBC WITH DIFFERENTIAL/PLATELET
BASOS ABS: 0.1 10*3/uL (ref 0.0–0.1)
BASOS PCT: 2 %
EOS ABS: 0.7 10*3/uL (ref 0.0–0.7)
Eosinophils Relative: 11 %
HCT: 42.5 % (ref 39.0–52.0)
HEMOGLOBIN: 14.8 g/dL (ref 13.0–17.0)
Lymphocytes Relative: 37 %
Lymphs Abs: 2.4 10*3/uL (ref 0.7–4.0)
MCH: 31.4 pg (ref 26.0–34.0)
MCHC: 34.8 g/dL (ref 30.0–36.0)
MCV: 90.2 fL (ref 78.0–100.0)
MONOS PCT: 9 %
Monocytes Absolute: 0.6 10*3/uL (ref 0.1–1.0)
NEUTROS PCT: 41 %
Neutro Abs: 2.7 10*3/uL (ref 1.7–7.7)
Platelets: 231 10*3/uL (ref 150–400)
RBC: 4.71 MIL/uL (ref 4.22–5.81)
RDW: 13.3 % (ref 11.5–15.5)
WBC: 6.6 10*3/uL (ref 4.0–10.5)

## 2017-02-01 LAB — I-STAT TROPONIN, ED: TROPONIN I, POC: 0 ng/mL (ref 0.00–0.08)

## 2017-02-01 LAB — BRAIN NATRIURETIC PEPTIDE: B Natriuretic Peptide: 10.8 pg/mL (ref 0.0–100.0)

## 2017-02-01 LAB — D-DIMER, QUANTITATIVE (NOT AT ARMC)

## 2017-02-01 MED ORDER — ALBUTEROL (5 MG/ML) CONTINUOUS INHALATION SOLN
10.0000 mg/h | INHALATION_SOLUTION | RESPIRATORY_TRACT | Status: DC
  Administered 2017-02-01: 10 mg/h via RESPIRATORY_TRACT

## 2017-02-01 MED ORDER — ALBUTEROL SULFATE (2.5 MG/3ML) 0.083% IN NEBU
2.5000 mg | INHALATION_SOLUTION | RESPIRATORY_TRACT | Status: DC
  Filled 2017-02-01: qty 3

## 2017-02-01 MED ORDER — ALBUTEROL SULFATE (5 MG/ML) 0.5% IN NEBU: 2.5000 mg | INHALATION_SOLUTION | Freq: Four times a day (QID) | RESPIRATORY_TRACT | 12 refills | Status: DC | PRN

## 2017-02-01 MED ORDER — ALBUTEROL SULFATE (2.5 MG/3ML) 0.083% IN NEBU
5.0000 mg | INHALATION_SOLUTION | Freq: Once | RESPIRATORY_TRACT | Status: AC
  Administered 2017-02-01: 5 mg via RESPIRATORY_TRACT
  Filled 2017-02-01: qty 6

## 2017-02-01 MED ORDER — MONTELUKAST SODIUM 10 MG PO TABS: 10.0000 mg | ORAL_TABLET | Freq: Every day | ORAL | 0 refills | Status: DC

## 2017-02-01 MED ORDER — ACETAMINOPHEN 500 MG PO TABS
1000.0000 mg | ORAL_TABLET | Freq: Once | ORAL | Status: AC
  Administered 2017-02-01: 1000 mg via ORAL
  Filled 2017-02-01: qty 2

## 2017-02-01 MED ORDER — ALBUTEROL SULFATE HFA 108 (90 BASE) MCG/ACT IN AERS: 1.0000 | INHALATION_SPRAY | Freq: Four times a day (QID) | RESPIRATORY_TRACT | 0 refills | Status: DC | PRN

## 2017-02-01 MED ORDER — IPRATROPIUM BROMIDE 0.02 % IN SOLN
1.0000 mg | Freq: Once | RESPIRATORY_TRACT | Status: AC
  Administered 2017-02-01: 1 mg via RESPIRATORY_TRACT
  Filled 2017-02-01: qty 5

## 2017-02-01 MED ORDER — PREDNISONE 50 MG PO TABS: ORAL_TABLET | ORAL | 0 refills | Status: DC

## 2017-02-01 MED ORDER — METHYLPREDNISOLONE SODIUM SUCC 125 MG IJ SOLR
125.0000 mg | Freq: Once | INTRAMUSCULAR | Status: AC
  Administered 2017-02-01: 125 mg via INTRAVENOUS
  Filled 2017-02-01: qty 2

## 2017-02-01 NOTE — ED Notes (Signed)
Bed: [facility]23 Expected date:  Expected time:  Means of arrival:  Comments: EMS 

## 2017-02-01 NOTE — ED Triage Notes (Addendum)
Pt c/o worsening shortness of breath and wheezing x 2 days. Pt states he has used inhaler, last used this morning, but no relief. Pt presented to triage with tachypnea and wheezing, 94% RA. albuteral neb started in triage. Pt with productive cough, white / clear sputum.

## 2017-02-01 NOTE — Discharge Instructions (Signed)
You presented to the emergency department for shortness of breath and chest tightness. Initially, you had significant wheezing in all your lung fields. After treatment, you felt much better and lung sounds completely improved. 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 Please take Singulair and a daily allergy medication (allegra, zyrtec, etc). Use albuterol inhaler as needed. You have been given a prescription for albuterol medicine for nebulizing machine.   Contact [facility] to establish care with a primary care provider for regular, routine medical care.  This clinic accepts patients without medical insurance. A primary care provider can adjust your daily medications and give you refills.

## 2017-02-01 NOTE — ED Provider Notes (Signed)
[facility] DEPT Provider Note   CSN: [id number] Arrival date & time: 02/01/17  1112     History   Chief Complaint Chief Complaint  Patient presents with  . Shortness of Breath  . Wheezing  . Asthma    HPI Aaron [name] is a 46 y.o. male with history of asthma presents to ED for evaluation of gradually worsening shortness of breath, cough, chest tightness and intermittent central chest pain 24 hours. Patient states symptoms today are typical of his asthma flares but worse than previous. Central chest pain is intermittent, worse with cough and taking a deep breath. Has tried albuterol inhaler without relief. Has a nebulizing machine at home but does not have medicine for it. 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 He smokes 3-4 cigarettes daily. Denies fevers, chills, exertional chest pain or shortness of breath, nausea, vomiting, palpitations, lower extremity edema, orthopnea. No previous history of CHF, DVT/PE, malignancy, recent asymmetrical lower extremity swelling or recent immobilization. She has never needed admission or intubation for asthma flares in the past. Has never been evaluated by pulmonology.  HPI  Past Medical History:  Diagnosis Date  . Asthma     There are no active problems to display for this patient.   Past Surgical History:  Procedure Laterality Date  . ABDOMINAL SURGERY    . HERNIA REPAIR         Home Medications    Prior to Admission medications   Medication Sig Start Date End Date Taking? Authorizing Provider  albuterol (PROVENTIL HFA;VENTOLIN HFA) 108 (90 Base) MCG/ACT inhaler Inhale 1-2 puffs into the lungs every 6 (six) hours as needed for wheezing or shortness of breath. 02/01/17   Liberty Handy, PA-C  albuterol (PROVENTIL) (5 MG/ML) 0.5% nebulizer solution Take 0.5 mLs (2.5 mg total) by nebulization every 6 (six) hours as needed for wheezing or shortness of breath. 02/01/17   Liberty Handy, PA-C    fluticasone (FLOVENT HFA) 110 MCG/ACT inhaler Inhale 2 puffs into the lungs 2 (two) times daily. Patient not taking: Reported on 02/25/2016 03/24/12   Palumbo, April, MD  montelukast (SINGULAIR) 10 MG tablet Take 1 tablet (10 mg total) by mouth at bedtime. 02/01/17 03/03/17  Liberty Handy, PA-C  predniSONE (DELTASONE) 50 MG tablet Take 1 pill daily 02/01/17   Liberty Handy, PA-C    Family History Family History  Problem Relation Age of Onset  . Diabetes Mother   . Hypertension Father     Social History Social History  Substance Use Topics  . Smoking status: Current Every Day Smoker    Packs/day: 0.50    Types: Cigarettes  . Smokeless tobacco: Never Used  . Alcohol use Yes     Comment: occasion     Allergies   Patient has no known allergies.   Review of Systems Review of Systems  Constitutional: Negative for chills and fever.  HENT: Negative for congestion, postnasal drip and sore throat.   Respiratory: Positive for cough, chest tightness and shortness of breath.   Cardiovascular: Positive for chest pain (with cough). Negative for palpitations and leg swelling.  Gastrointestinal: Negative for nausea and vomiting.  Musculoskeletal: Negative for back pain.  Allergic/Immunologic: Negative for immunocompromised state.  Neurological: Negative for light-headedness.     Physical Exam Updated Vital Signs BP 124/80 (BP Location: Left Arm)   Pulse 90   Resp 20   SpO2 94%   Physical Exam  Constitutional: He is oriented to person,  place, and time. He appears well-developed and well-nourished. No distress.  Actively coughing, appears uncomfortable but speaking in full sentences  HENT:  Head: Normocephalic and atraumatic.  Right Ear: External ear normal.  Left Ear: External ear normal.  Nose: Nose normal.  Moist mucous membranes Oropharynx and tonsils normal   Eyes: Conjunctivae and EOM are normal. No scleral icterus.  Neck: Normal range of motion. Neck supple.   Cardiovascular: Normal rate, regular rhythm, S1 normal, S2 normal, normal heart sounds and intact distal pulses.   No murmur heard. Pulses:      Radial pulses are 2+ on the right side, and 2+ on the left side.       Dorsalis pedis pulses are 2+ on the right side, and 2+ on the left side.       Posterior tibial pulses are 2+ on the right side, and 2+ on the left side.  Pulmonary/Chest: Effort normal. Tachypnea noted. He has wheezes.  Diffuse inspiratory and expiratory wheezing in all lung fields, no crackles or rhonchi Tachypnic 94% O2 in RA No chest wall tenderness No accessory muscle usage   Abdominal: Soft. There is no tenderness.  Musculoskeletal: Normal range of motion. He exhibits no deformity.  Neurological: He is alert and oriented to person, place, and time.  Skin: Skin is warm and dry. Capillary refill takes less than 2 seconds.  Psychiatric: He has a normal mood and affect. His behavior is normal. Judgment and thought content normal.  Nursing note and vitals reviewed.    ED Treatments / Results  Labs (all labs ordered are listed, but only abnormal results are displayed) Labs Reviewed  CBC WITH DIFFERENTIAL/PLATELET  D-DIMER, QUANTITATIVE (NOT AT [facility])  BRAIN NATRIURETIC PEPTIDE  I-STAT TROPONIN, ED    EKG  EKG Interpretation None       Radiology Dg Chest 2 View  Result Date: 02/01/2017 CLINICAL DATA:  Worsening cough, chest congestion, and shortness of breath since yesterday. Occasional smoker. History of asthma. EXAM: CHEST  2 VIEW COMPARISON:  Chest x-ray of January 09, 2017 FINDINGS: The lungs are well-expanded with mild hemidiaphragm flattening. There is no focal infiltrate, pleural effusion, or pneumothorax. The heart and pulmonary vascularity are normal. The mediastinum is normal in width. The trachea is midline. The bony thorax exhibits no acute abnormality. IMPRESSION: Hyperinflation consistent with reactive airway disease or chronic bronchitis/smoking  related changes. No alveolar pneumonia nor CHF. Electronically Signed   By: David  [place] M.D.   On: 02/01/2017 11:56    Procedures Procedures (including critical care time)  Medications Ordered in ED Medications  albuterol (PROVENTIL,VENTOLIN) solution continuous neb (0 mg/hr Nebulization Stopped 02/01/17 1436)  albuterol (PROVENTIL) (2.5 MG/3ML) 0.083% nebulizer solution 5 mg (5 mg Nebulization Given 02/01/17 1124)  ipratropium (ATROVENT) nebulizer solution 1 mg (1 mg Nebulization Given 02/01/17 1256)  methylPREDNISolone sodium succinate (SOLU-MEDROL) 125 mg/2 mL injection 125 mg (125 mg Intravenous Given 02/01/17 1258)  acetaminophen (TYLENOL) tablet 1,000 mg (1,000 mg Oral Given 02/01/17 1257)     Initial Impression / Assessment and Plan / ED Course  I have reviewed the triage vital signs and the nursing notes.  Pertinent labs & imaging results that were available during my care of the patient were reviewed by me and considered in my medical decision making (see chart for details).  Clinical Course as of Feb 02 1528  Wed Feb 01, 2017  1351 Reevaluated patient after one hour of continuous DuoNeb. Chest tightness and cough have improved per patient. Wheezing has  slightly improved however she still has diffuse moderate X Vettori wheezing in all lung fields anteriorly and posteriorly. D-dimer pending, will follow up and reassess.  [CG]  1504 Reevaluated patient. He feels significantly better, now coughing or chest tightness or shows of breath. Lungs are now clear bilaterally without wheezing.  [CG]    Clinical Course User Index [CG] Liberty Handy, PA-C   46 year old male asthma and tobacco abuse presents to ED or shortness of breath, chest tightness and central chest pain. Chest pain is not exertional, worse with cough and deep breathing. States the symptoms are typical of his asthma flares. Initial vitals show mild tachypnea and oxygen saturation of 94%. Initially patient had  significant cough and inspiratory and expiratory wheezing in all lung fields.  Lab work without leukocytosis, negative troponin 1. D-dimer negative. Chest x-ray without infiltrate. Patient received Solu-Medrol, Atrovent, continuous albuterol neb. Prior to discharge patient felt a lot better, no cough or chest tightness, lungs were clear without wheezing. He felt comfortable going home. I dont think he needs magnesium or further emergent lab work, imaging or interventions. I don't think delta troponin is needed this patient. Patient was discharged with albuterol, Singulair, antihistamine. Encouraged smoking cessation. He does not have insurance or a PCP, with advice to [facility] for further management of his asthma. EKG was not completed prior to discharge although low suspicion for ACS in this patient, chest pain likely from recurrent coughing.   Final Clinical Impressions(s) / ED Diagnoses   Final diagnoses:  Moderate persistent asthma with exacerbation    New Prescriptions Discharge Medication List as of 02/01/2017  3:12 PM    START taking these medications   Details  albuterol (PROVENTIL) (5 MG/ML) 0.5% nebulizer solution Take 0.5 mLs (2.5 mg total) by nebulization every 6 (six) hours as needed for wheezing or shortness of breath., Starting Wed 02/01/2017, Print    montelukast (SINGULAIR) 10 MG tablet Take 1 tablet (10 mg total) by mouth at bedtime., Starting Wed 02/01/2017, Until Fri 03/03/2017, Print    predniSONE (DELTASONE) 50 MG tablet Take 1 pill daily, Print         Jerrell Mylar 02/01/17 1529    Gerhard Munch, MD 02/01/17 1539

## 2017-02-01 NOTE — ED Notes (Signed)
Bed: [facility] Expected date:  Expected time:  Means of arrival:  Comments: 

## 2017-02-23 ENCOUNTER — Encounter (HOSPITAL_COMMUNITY): Payer: Self-pay | Admitting: Family Medicine

## 2017-02-23 ENCOUNTER — Inpatient Hospital Stay (HOSPITAL_COMMUNITY)
Admission: EM | Admit: 2017-02-23 | Discharge: 2017-02-25 | DRG: 189 | Disposition: A | Discharge status: Home / Self Care | Payer: Self-pay | Attending: Internal Medicine | Admitting: Internal Medicine

## 2017-02-23 ENCOUNTER — Emergency Department (HOSPITAL_COMMUNITY): Payer: Self-pay

## 2017-02-23 DIAGNOSIS — Z23 Encounter for immunization: Secondary | ICD-10-CM

## 2017-02-23 DIAGNOSIS — J9621 Acute and chronic respiratory failure with hypoxia: Secondary | ICD-10-CM | POA: Diagnosis present

## 2017-02-23 DIAGNOSIS — J9601 Acute respiratory failure with hypoxia: Principal | ICD-10-CM | POA: Diagnosis present

## 2017-02-23 DIAGNOSIS — Z5329 Procedure and treatment not carried out because of patient's decision for other reasons: Secondary | ICD-10-CM | POA: Diagnosis not present

## 2017-02-23 DIAGNOSIS — Z720 Tobacco use: Secondary | ICD-10-CM

## 2017-02-23 DIAGNOSIS — Z79899 Other long term (current) drug therapy: Secondary | ICD-10-CM

## 2017-02-23 DIAGNOSIS — R0602 Shortness of breath: Secondary | ICD-10-CM

## 2017-02-23 DIAGNOSIS — J45901 Unspecified asthma with (acute) exacerbation: Secondary | ICD-10-CM | POA: Diagnosis present

## 2017-02-23 DIAGNOSIS — R000 Tachycardia, unspecified: Secondary | ICD-10-CM | POA: Diagnosis present

## 2017-02-23 DIAGNOSIS — T486X5A Adverse effect of antiasthmatics, initial encounter: Secondary | ICD-10-CM | POA: Diagnosis present

## 2017-02-23 DIAGNOSIS — E876 Hypokalemia: Secondary | ICD-10-CM | POA: Diagnosis present

## 2017-02-23 DIAGNOSIS — J4541 Moderate persistent asthma with (acute) exacerbation: Secondary | ICD-10-CM

## 2017-02-23 LAB — BASIC METABOLIC PANEL
ANION GAP: 7 (ref 5–15)
BUN: 10 mg/dL (ref 6–20)
CHLORIDE: 103 mmol/L (ref 101–111)
CO2: 24 mmol/L (ref 22–32)
Calcium: 7 mg/dL — ABNORMAL LOW (ref 8.9–10.3)
Creatinine, Ser: 0.86 mg/dL (ref 0.61–1.24)
GFR calc non Af Amer: >60 mL/min (ref >60)
GLUCOSE: 94 mg/dL (ref 65–99)
Potassium: 2.6 mmol/L — CL (ref 3.5–5.1)
Sodium: 134 mmol/L — ABNORMAL LOW (ref 135–145)

## 2017-02-23 LAB — CBC WITH DIFFERENTIAL/PLATELET
BASOS ABS: 0.1 10*3/uL (ref 0.0–0.1)
Basophils Relative: 1 %
EOS PCT: 2 %
Eosinophils Absolute: 0.2 10*3/uL (ref 0.0–0.7)
HEMATOCRIT: 37.9 % — AB (ref 39.0–52.0)
HEMOGLOBIN: 12.7 g/dL — AB (ref 13.0–17.0)
LYMPHS ABS: 1.4 10*3/uL (ref 0.7–4.0)
LYMPHS PCT: 14 %
MCH: 31.1 pg (ref 26.0–34.0)
MCHC: 33.5 g/dL (ref 30.0–36.0)
MCV: 92.9 fL (ref 78.0–100.0)
MONOS PCT: 4 %
Monocytes Absolute: 0.4 10*3/uL (ref 0.1–1.0)
NEUTROS ABS: 7.8 10*3/uL — AB (ref 1.7–7.7)
Neutrophils Relative %: 79 %
RBC: 4.08 MIL/uL — AB (ref 4.22–5.81)
RDW: 13.4 % (ref 11.5–15.5)
WBC: 9.9 10*3/uL (ref 4.0–10.5)

## 2017-02-23 MED ORDER — ONDANSETRON HCL 4 MG/2ML IJ SOLN: 4.0000 mg | Freq: Four times a day (QID) | INTRAMUSCULAR | Status: DC | PRN

## 2017-02-23 MED ORDER — SENNOSIDES-DOCUSATE SODIUM 8.6-50 MG PO TABS: 1.0000 | ORAL_TABLET | Freq: Every evening | ORAL | Status: DC | PRN

## 2017-02-23 MED ORDER — ALBUTEROL (5 MG/ML) CONTINUOUS INHALATION SOLN
10.0000 mg/h | INHALATION_SOLUTION | Freq: Once | RESPIRATORY_TRACT | Status: AC
  Administered 2017-02-23: 10 mg/h via RESPIRATORY_TRACT

## 2017-02-23 MED ORDER — METHYLPREDNISOLONE SODIUM SUCC 125 MG IJ SOLR
125.0000 mg | Freq: Once | INTRAMUSCULAR | Status: AC
  Administered 2017-02-23: 125 mg via INTRAVENOUS

## 2017-02-23 MED ORDER — MAGNESIUM SULFATE 2 GM/50ML IV SOLN: 2.0000 g | Freq: Once | INTRAVENOUS | Status: DC

## 2017-02-23 MED ORDER — POTASSIUM CHLORIDE CRYS ER 20 MEQ PO TBCR
30.0000 meq | EXTENDED_RELEASE_TABLET | Freq: Once | ORAL | Status: AC
  Administered 2017-02-23: 23:00:00 30 meq via ORAL
  Filled 2017-02-23: qty 1

## 2017-02-23 MED ORDER — ACETAMINOPHEN 325 MG PO TABS: 650.0000 mg | ORAL_TABLET | Freq: Four times a day (QID) | ORAL | Status: DC | PRN

## 2017-02-23 MED ORDER — SODIUM CHLORIDE 0.9% FLUSH
3.0000 mL | Freq: Two times a day (BID) | INTRAVENOUS | Status: DC
  Administered 2017-02-24: 3 mL via INTRAVENOUS

## 2017-02-23 MED ORDER — POTASSIUM CHLORIDE IN NACL 40-0.9 MEQ/L-% IV SOLN
INTRAVENOUS | Status: DC
  Administered 2017-02-23: 100 mL/h via INTRAVENOUS
  Filled 2017-02-23: qty 1000

## 2017-02-23 MED ORDER — METHYLPREDNISOLONE SODIUM SUCC 40 MG IJ SOLR
40.0000 mg | Freq: Three times a day (TID) | INTRAMUSCULAR | Status: AC
  Administered 2017-02-24 (×3): 40 mg via INTRAVENOUS
  Filled 2017-02-23 (×3): qty 1

## 2017-02-23 MED ORDER — MAGNESIUM SULFATE 2 GM/50ML IV SOLN
INTRAVENOUS | Status: AC
  Administered 2017-02-23: 18:00:00
  Filled 2017-02-23: qty 50

## 2017-02-23 MED ORDER — ONDANSETRON HCL 4 MG PO TABS: 4.0000 mg | ORAL_TABLET | Freq: Four times a day (QID) | ORAL | Status: DC | PRN

## 2017-02-23 MED ORDER — ACETAMINOPHEN 650 MG RE SUPP: 650.0000 mg | Freq: Four times a day (QID) | RECTAL | Status: DC | PRN

## 2017-02-23 MED ORDER — ALBUTEROL (5 MG/ML) CONTINUOUS INHALATION SOLN
15.0000 mg/h | INHALATION_SOLUTION | RESPIRATORY_TRACT | Status: DC
  Administered 2017-02-23: 15 mg/h via RESPIRATORY_TRACT
  Filled 2017-02-23: qty 20

## 2017-02-23 MED ORDER — ALBUTEROL SULFATE (2.5 MG/3ML) 0.083% IN NEBU
2.5000 mg | INHALATION_SOLUTION | RESPIRATORY_TRACT | Status: DC | PRN
  Administered 2017-02-24: 2.5 mg via RESPIRATORY_TRACT
  Filled 2017-02-23: qty 3

## 2017-02-23 MED ORDER — BISACODYL 5 MG PO TBEC: 5.0000 mg | DELAYED_RELEASE_TABLET | Freq: Every day | ORAL | Status: DC | PRN

## 2017-02-23 MED ORDER — EPINEPHRINE 0.3 MG/0.3ML IJ SOAJ
0.3000 mg | Freq: Once | INTRAMUSCULAR | Status: AC
  Administered 2017-02-23: 0.3 mg via INTRAMUSCULAR

## 2017-02-23 MED ORDER — HYDROCODONE-ACETAMINOPHEN 5-325 MG PO TABS: 1.0000 | ORAL_TABLET | ORAL | Status: DC | PRN

## 2017-02-23 NOTE — ED Provider Notes (Signed)
I saw and evaluated the patient, reviewed the resident's note and I agree with the findings and plan.  Pertinent History: The patient is a 46 year old male, works for a [employer], has a history of asthma and takes albuterol treatments at least 4 times per day.  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  He does not know why this happened.  He denies any insect stings or new exposures.  He became severely short of breath which was severe, constant, gradually worsening and by the time he got to the fire station he was about ready to pass out.  He was severely hypoxic, dyspneic and in acute respiratory distress.  The patient is unable to give me much information because he is speaking in 1-2 word sentences and severely dyspneic, level 5 caveat applies.  Review of systems: Level 5 caveat applies secondary to severe respiratory distress  Pertinent Exam findings: On exam the patient is in severe distress, using accessory muscles, supraclavicular, subcostal, intercostal retractions, severe prolonged expiratory phase, diffuse expiratory wheezing with minimal air movement.  He has no edema, slight JVD, he is unable to speak because of severe distress.  Abdomen is nontender, multiple old scars on the abdominal wall.  I was personally present and directly supervised the following procedures:  Medical resuscitation, continuous nebulizer therapy, status asthmaticus, critical care provided   EKG Interpretation  Date/Time:  Friday February 24 2017 04:35:01 EDT Ventricular Rate:  91 PR Interval:    QRS Duration: 90 QT Interval:  341 QTC Calculation: 420 R Axis:   82 Text Interpretation:  Sinus rhythm Anterior infarct, old Confirmed by Bethann BerkshireZammit, Joseph [phone]) on 02/25/2017 1:20:26 PM        I personally interpreted the EKG as well as the resident and agree with the interpretation on the resident's chart.  Final  diagnoses:  None    Physical Exam  There were no vitals taken for this visit.  Physical Exam  ED Course  .Critical Care Performed by: Eber HongMILLER, Veronika Heard Authorized by: Eber HongMILLER, Shaelyn Decarli   Critical care provider statement:    Critical care time (minutes):  35   Critical care time was exclusive of:  Separately billable procedures and treating other patients and teaching time   Critical care was necessary to treat or prevent imminent or life-threatening deterioration of the following conditions:  Respiratory failure   Critical care was time spent personally by me on the following activities:  Blood draw for specimens, development of treatment plan with patient or surrogate, discussions with consultants, evaluation of patient's response to treatment, examination of patient, obtaining history from patient or surrogate, ordering and performing treatments and interventions, ordering and review of laboratory studies, ordering and review of radiographic studies, pulse oximetry, re-evaluation of patient's condition and review of old charts    The patient is in severe distress and may decompensate requiring intubation though at this time getting magnesium intravenously, he required intramuscular epinephrine, continuous albuterol treatment, multiple reexaminations and the patient remains in respiratory distress though slightly improved.  MDM  Admit to high level of care       Eber HongMiller, Kemal Amores, MD 02/26/17 [phone]

## 2017-02-23 NOTE — ED Triage Notes (Signed)
Pt to ER for acute respiratory distress related to asthma. Pt significantly labored with retractions, diaphoretic, unable to speak in sentences. Received 15 mg albuterol with EMS, MD Hyacinth MeekerMiller at bedside ordering epi pen, 125 mg solumedrol, and 2 mg mag. Pt refusing bipap, 82% on RA. Respiratory at bedside. Hx of asthma.

## 2017-02-23 NOTE — H&P (Signed)
History and Physical    Aaron [name] OZH:[id number] DOB: [birth year] DOA: 02/23/2017  PCP: System, Pcp Not In   Patient coming from: Home  Chief Complaint: SOB, wheezing   HPI: Aaron [name] is a 46 y.o. male with medical history significant for asthma, now presenting to the emergency department for evaluation of respiratory distress and wheezing.  Patient reports that he has had some increased wheezing and dyspnea over the past couple days that he attributes to the recent cold weather, but late this morning, he developed an acute worsening of this with difficulty catching his breath at rest.  He denies any recent fevers, chills, rhinorrhea, sore throat, or sick contacts.  He denies chest pain or palpitations.  Denies leg swelling or tenderness.  Only uses albuterol for his asthma, has never required hospitalization for this.  ED Course: Upon arrival to the ED, patient is found to be saturating 82% on room air, tachypneic in the 30s, tachycardic in the 110s, and with stable blood pressure.   chest x-ray is negative for acute cardiopulmonary disease.  Chemistry panel reveals a potassium of 2.6.  CBC is notable for a slight normocytic anemia with hemoglobin of 12.7.  Patient was treated with 125 mg of IV Solu-Medrol, 2 g of IV magnesium, and 2 continuous albuterol treatments.  He has improved significantly with these measures, but continues to be in some mild distress while at rest immediately following a continuous treatment, and he will be admitted to the stepdown unit for ongoing evaluation and management of acute asthma exacerbation with acute hypoxic respiratory failure.  Review of Systems:  All other systems reviewed and apart from HPI, are negative.  History reviewed. No pertinent past medical history.  History reviewed. No pertinent surgical history.   has no tobacco, alcohol, and drug history on file.  No Known Allergies  History reviewed. No pertinent family history.   Prior  to Admission medications   Medication Sig Start Date End Date Taking? Authorizing Provider  albuterol (PROVENTIL HFA;VENTOLIN HFA) 108 (90 Base) MCG/ACT inhaler Inhale 1-2 puffs into the lungs every 6 (six) hours as needed for wheezing or shortness of breath.   Yes [provider]    Physical Exam: Vitals:   02/23/17 1915 02/23/17 1930 02/23/17 1945 02/23/17 2127  BP: 130/74 (!) 144/91 128/66   Pulse: (!) 110 (!) 120 (!) 127   Resp: (!) 21 (!) 30 (!) 24   SpO2: 93% 95% 97% 94%      Constitutional: Mild distress with tachypnea and dyspnea with rest, calm, no pallor, no diaphoresis Eyes: PERTLA, lids and conjunctivae normal ENMT: Mucous membranes are moist. Posterior pharynx clear of any exudate or lesions.   Neck: normal, supple, no masses, no thyromegaly Respiratory: Diminished bilaterally, prolonged expiratory phase, diffuse wheezes. No accessory muscle use.  Cardiovascular: Rate ~110 and regular. No extremity edema. No significant JVD. Abdomen: No distension, no tenderness, no masses palpated. Bowel sounds normal.  Musculoskeletal: no clubbing / cyanosis. No joint deformity upper and lower extremities.   Skin: no significant rashes, lesions, ulcers. Warm, dry, well-perfused. Neurologic: CN 2-12 grossly intact. Sensation intact. Strength 5/5 in all 4 limbs.  Psychiatric: Alert and oriented x 3. Calm, cooperative.     Labs on Admission: I have personally reviewed following labs and imaging studies  CBC:  Recent Labs Lab 02/23/17 1923  WBC 9.9  NEUTROABS 7.8*  HGB 12.7*  HCT 37.9*  MCV 92.9  PLT PLATELET CLUMPING, SUGGEST RECOLLECTION OF SAMPLE IN CITRATE  TUBE.   Basic Metabolic Panel:  Recent Labs Lab 02/23/17 1923  NA 134*  K 2.6*  CL 103  CO2 24  GLUCOSE 94  BUN 10  CREATININE 0.86  CALCIUM 7.0*   GFR: CrCl cannot be calculated (Unknown ideal weight.). Liver Function Tests: No results for input(s): AST, ALT, ALKPHOS, BILITOT, PROT, ALBUMIN in  the last 168 hours. No results for input(s): LIPASE, AMYLASE in the last 168 hours. No results for input(s): AMMONIA in the last 168 hours. Coagulation Profile: No results for input(s): INR, PROTIME in the last 168 hours. Cardiac Enzymes: No results for input(s): CKTOTAL, CKMB, CKMBINDEX, TROPONINI in the last 168 hours. BNP (last 3 results) No results for input(s): PROBNP in the last 8760 hours. HbA1C: No results for input(s): HGBA1C in the last 72 hours. CBG: No results for input(s): GLUCAP in the last 168 hours. Lipid Profile: No results for input(s): CHOL, HDL, LDLCALC, TRIG, CHOLHDL, LDLDIRECT in the last 72 hours. Thyroid Function Tests: No results for input(s): TSH, T4TOTAL, FREET4, T3FREE, THYROIDAB in the last 72 hours. Anemia Panel: No results for input(s): VITAMINB12, FOLATE, FERRITIN, TIBC, IRON, RETICCTPCT in the last 72 hours. Urine analysis: No results found for: COLORURINE, APPEARANCEUR, LABSPEC, PHURINE, GLUCOSEU, HGBUR, BILIRUBINUR, KETONESUR, PROTEINUR, UROBILINOGEN, NITRITE, LEUKOCYTESUR Sepsis Labs: @LABRCNTIP (procalcitonin:4,lacticidven:4) )No results found for this or any previous visit (from the past 240 hour(s)).   Radiological Exams on Admission: Dg Chest Portable 1 View  Result Date: 02/23/2017 CLINICAL DATA:  Patient with asthma. EXAM: PORTABLE CHEST 1 VIEW COMPARISON:  None. FINDINGS: Monitoring leads overlie the patient. Normal cardiac and mediastinal contours. No consolidative pulmonary opacities. No pleural effusion or pneumothorax. Regional skeleton is unremarkable. IMPRESSION: No acute cardiopulmonary process. Electronically Signed   By: Annia Belt M.D.   On: 02/23/2017 18:09    EKG: Ordered and pending.   Assessment/Plan  1. Acute asthma exacerbation, acute hypoxic respiratory failure  - Pt presents with acute exacerbation in asthma, improved in ED after 125 IV Solu-Medrol, 2 g IV mag, and 2 continuous albuterol treatments, but continues to be  symptomatic at rest and with new supplemental O2 requirement  - Plan to continue systemic steroid with Solu-Medrol, continue supplemental O2 prn, continue albuterol nebs    2. Hypokalemia  - Serum potassium 2.6 on admission, with albuterol likely contributing  - 30 mEq oral potassium given and KCl added to IVF; 2 g IV magnesium had been given in ED    - Continue cardiac monitoring and repeat chem panel in am     DVT prophylaxis: SCD's  Code Status: Full  Family Communication: Discussed with patient Disposition Plan: Admit to SDU Consults called: None Admission status: Inpatient    Briscoe Deutscher, MD [facility] Pager [phone]  If 7PM-7AM, please contact night-coverage [email or web address] Password [facility]  02/23/2017, 9:53 PM

## 2017-02-23 NOTE — ED Provider Notes (Signed)
[facility] EMERGENCY DEPARTMENT Provider Note   CSN: [id number] Arrival date & time: 02/23/17  1746  History   Chief Complaint Chief Complaint  Patient presents with  . Asthma  . Respiratory Distress   HPI Kemari JAYMOND WAAGE is a 46 y.o. 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 Patient states that the symptoms have been progressing slowly over the past month and the cold weather usually exacerbates his asthma. He admits to using his rescue inhaler more than 3x a day regularly due to shortness of breath and wheezing.   The history is provided by the EMS personnel and the patient.  Shortness of Breath  This is a new problem. The average episode lasts 1 hour. The problem occurs intermittently.The current episode started less than 1 hour ago. The problem has been rapidly worsening. Associated symptoms include cough. Pertinent negatives include no fever and no sputum production. The problem's precipitants include pollens. He has tried beta-agonist inhalers for the symptoms. The treatment provided mild relief. He has had prior ED visits. Associated medical issues include asthma.   No past medical history on file.  There are no active problems to display for this patient.  No past surgical history on file.  Home Medications    Prior to Admission medications   Not on File   Family History No family history on file.  Social History Social History  Substance Use Topics  . Smoking status: Not on file  . Smokeless tobacco: Not on file  . Alcohol use Not on file   Allergies   Patient has no allergy information on record.  Review of Systems Review of Systems  Unable to perform ROS: Severe respiratory distress  Constitutional: Negative for fever.  Respiratory: Positive for cough and shortness of breath. Negative for sputum production.    Physical Exam Updated Vital  Signs BP 128/66   Pulse (!) 127   Resp (!) 24   SpO2 97%   Physical Exam  Constitutional: He appears well-developed and well-nourished. He appears distressed.  HENT:  Head: Normocephalic and atraumatic.  Eyes: Conjunctivae are normal.  Neck: Neck supple.  Cardiovascular: Regular rhythm.  Tachycardia present.   No murmur heard. Pulmonary/Chest: Accessory muscle usage present. Tachypnea noted. He is in respiratory distress. He has decreased breath sounds (in all fields). He has wheezes (in all fields).  Abdominal: Soft. There is no tenderness.  Musculoskeletal: He exhibits no edema.  Neurological: He is alert.  Skin: Skin is warm. He is diaphoretic.  Psychiatric: He has a normal mood and affect.  Nursing note and vitals reviewed.  ED Treatments / Results  Labs (all labs ordered are listed, but only abnormal results are displayed) Labs Reviewed  CBC WITH DIFFERENTIAL/PLATELET - Abnormal; Notable for the following:       Result Value   RBC 4.08 (*)    Hemoglobin 12.7 (*)    HCT 37.9 (*)    All other components within normal limits  BASIC METABOLIC PANEL   EKG  EKG Interpretation None      Radiology Dg Chest Portable 1 View  Result Date: 02/23/2017 CLINICAL DATA:  Patient with asthma. EXAM: PORTABLE CHEST 1 VIEW COMPARISON:  None. FINDINGS: Monitoring leads overlie the patient. Normal cardiac and mediastinal contours. No consolidative pulmonary opacities. No pleural effusion or pneumothorax. Regional skeleton is unremarkable. IMPRESSION: No acute cardiopulmonary process. Electronically Signed   By: Francis Gaines.D.  On: 02/23/2017 18:09   Procedures Procedures (including critical care time)  Medications Ordered in ED Medications  albuterol (PROVENTIL,VENTOLIN) solution continuous neb (15 mg/hr Nebulization New Bag/Given 02/23/17 1814)  magnesium sulfate 2 GM/50ML IVPB (  New Bag/Given 02/23/17 1815)  EPINEPHrine (EPI-PEN) injection 0.3 mg (0.3 mg Intramuscular Given  02/23/17 1815)  methylPREDNISolone sodium succinate (SOLU-MEDROL) 125 mg/2 mL injection 125 mg (125 mg Intravenous Given 02/23/17 1815)   Initial Impression / Assessment and Plan / ED Course  I have reviewed the triage vital signs and the nursing notes.  Pertinent labs & imaging results that were available during my care of the patient were reviewed by me and considered in my medical decision making (see chart for details).  Keyshaun Elberta SpanielL House is a 46 y.o. male with significant PMHx of asthma who presented to ED in respiratory distress with concerns for asthma exacerbation.   Upon arrival the patient appears to be in acute respiratory distress, he is tachypneic, retracting with use of multiple accessory muscles, and is diaphoretic. On exam he has diminished breath sound bilateral with scattered wheezing through out.   Respiratory therapy available to start the patient on continuous albuterol nebulizer, he has given IM epi, IV magnesium 2mg , and IV solumedrol 125 shortly after arrival.   Attempts to place the patient on  BiPAP unsuccessful as the patient was unable to tolerate the mask.   CXR: no acute process  Basic labs studies: remarkable only for hypokalemia likely iatrogenic due to albuterol  Patient remained on continuous albuterol nebulizer therapy during his ED stay. Significant improvement in symptoms and respiratory distress however on exam breath sounds remained diminished with scattered wheezes throughout. Unable to space patient from continuous nebulizer.  At this time deem admission reasonable due to severity of respiratory distress and patient's continual need for albuterol and medical management.  Patient admitted to hospitalist in stable condition.  The plan for this patient was discussed with Dr. Hyacinth MeekerMiller, who voiced agreement and who oversaw evaluation and treatment of this patient.    Final Clinical Impressions(s) / ED Diagnoses   Final diagnoses:  Severe asthma with  exacerbation, unspecified whether persistent  Shortness of breath   New Prescriptions New Prescriptions   No medications on file     Lamont SnowballGarza, Berdie Malter, MD 02/24/17 1615    Eber HongMiller, Brian, MD 02/26/17 1630

## 2017-02-24 ENCOUNTER — Encounter (HOSPITAL_COMMUNITY): Payer: Self-pay

## 2017-02-24 DIAGNOSIS — R0602 Shortness of breath: Secondary | ICD-10-CM

## 2017-02-24 LAB — BASIC METABOLIC PANEL
Anion gap: 9 (ref 5–15)
BUN: 15 mg/dL (ref 6–20)
CALCIUM: 9.2 mg/dL (ref 8.9–10.3)
CO2: 22 mmol/L (ref 22–32)
CREATININE: 1.16 mg/dL (ref 0.61–1.24)
Chloride: 109 mmol/L (ref 101–111)
GFR calc Af Amer: >60 mL/min (ref >60)
GLUCOSE: 149 mg/dL — AB (ref 65–99)
Potassium: 4.6 mmol/L (ref 3.5–5.1)
Sodium: 140 mmol/L (ref 135–145)

## 2017-02-24 LAB — MRSA PCR SCREENING: MRSA by PCR: NEGATIVE

## 2017-02-24 LAB — HIV ANTIBODY (ROUTINE TESTING W REFLEX): HIV SCREEN 4TH GENERATION: NONREACTIVE

## 2017-02-24 LAB — MAGNESIUM: MAGNESIUM: 2.1 mg/dL (ref 1.7–2.4)

## 2017-02-24 MED ORDER — PNEUMOCOCCAL VAC POLYVALENT 25 MCG/0.5ML IJ INJ
0.5000 mL | INJECTION | INTRAMUSCULAR | Status: AC
  Administered 2017-02-25: 0.5 mL via INTRAMUSCULAR
  Filled 2017-02-24: qty 0.5

## 2017-02-24 MED ORDER — PREDNISONE 20 MG PO TABS
40.0000 mg | ORAL_TABLET | Freq: Every day | ORAL | Status: DC
  Administered 2017-02-25: 40 mg via ORAL
  Filled 2017-02-24: qty 2

## 2017-02-24 MED ORDER — ALBUTEROL SULFATE (2.5 MG/3ML) 0.083% IN NEBU
2.5000 mg | INHALATION_SOLUTION | Freq: Two times a day (BID) | RESPIRATORY_TRACT | Status: DC
  Administered 2017-02-24 – 2017-02-25 (×2): 2.5 mg via RESPIRATORY_TRACT
  Filled 2017-02-24 (×2): qty 3

## 2017-02-24 MED ORDER — INFLUENZA VAC SPLIT QUAD 0.5 ML IM SUSY
0.5000 mL | PREFILLED_SYRINGE | INTRAMUSCULAR | Status: AC
  Administered 2017-02-25: 0.5 mL via INTRAMUSCULAR
  Filled 2017-02-24: qty 0.5

## 2017-02-24 MED ORDER — ACETAMINOPHEN 325 MG PO TABS: 650.0000 mg | ORAL_TABLET | Freq: Four times a day (QID) | ORAL | Status: DC | PRN

## 2017-02-24 MED ORDER — TRAMADOL HCL 50 MG PO TABS: 50.0000 mg | ORAL_TABLET | Freq: Four times a day (QID) | ORAL | Status: DC | PRN

## 2017-02-24 MED ORDER — ALBUTEROL SULFATE (2.5 MG/3ML) 0.083% IN NEBU
2.5000 mg | INHALATION_SOLUTION | Freq: Four times a day (QID) | RESPIRATORY_TRACT | Status: DC
  Administered 2017-02-24: 2.5 mg via RESPIRATORY_TRACT
  Filled 2017-02-24: qty 3

## 2017-02-24 MED ORDER — BUDESONIDE 0.25 MG/2ML IN SUSP
0.2500 mg | Freq: Two times a day (BID) | RESPIRATORY_TRACT | Status: DC
  Administered 2017-02-24 – 2017-02-25 (×3): 0.25 mg via RESPIRATORY_TRACT
  Filled 2017-02-24 (×3): qty 2

## 2017-02-24 MED ORDER — HYDROCODONE-ACETAMINOPHEN 5-325 MG PO TABS: 1.0000 | ORAL_TABLET | ORAL | Status: DC | PRN

## 2017-02-24 NOTE — Progress Notes (Signed)
[facility], pt sitting up eating in room with family at bedside. Vitals stable, pt on room air.

## 2017-02-24 NOTE — ED Notes (Signed)
Provided patient with hospital bed and family member with recliner for comfort. Ambulated patient to restroom with O2 via [place]; offered wheel chair, but refused. NAD while ambulating.

## 2017-02-24 NOTE — ED Notes (Signed)
Attempted report 

## 2017-02-24 NOTE — Progress Notes (Signed)
TEAM 1 - Stepdown/ICU TEAM  Aaron [name]  ZOX:[id number] DOB: 10/30/1970 DOA: 02/23/2017 PCP: System, Pcp Not In    Brief Narrative:  46 y.o. male with history of asthma who presented to the ED w/ acute respiratory distress and wheezing which had been slowly building over 2-3 days.  In the ED he was found to have a sat of 82%.    Subjective: The patient continues to wheeze.  Overall he states he feels better.  He continues to require supplemental oxygen.  He denies chest pain nausea vomiting or abdominal pain.  The patient reports that he has limited access to medications due to lack of health insurance.  Likewise he does not have a primary care physician.  He admits to tobacco abuse.  He states that his "asthma" has not been a lifelong problem but has been a recurring issue for the last few years worsening particularly during seasonal changes.  Assessment & Plan:  Acute hypoxic respiratory failure - acute bronchospasm exacerbation Unclear if this is true asthma or rather the early stages of emphysema - continues to experience bronchospasm - continue nebs and steroid therapy - will need to be mindful of cost issues when transitioning to intended home medical regimen - no evidence of acute infection  Tobacco abuse The patient has been counseled as to the absolute need to discontinue tobacco abuse and the fact that his pulmonary disease will undoubtedly accelerate should he continue  Hypokalemia Corrected - likely related to high dose albuterol admin   DVT prophylaxis: SCDs Code Status: FULL CODE Family Communication: no family present at time of exam  Disposition Plan: ambulate - wean off O2 - monitor wheezing - home later this afternoon or in AM if liberated from O2 w/ ambulation and wheezing improved   Consultants:  none  Procedures: none  Antimicrobials:  none   Objective: [name] pressure 116/72, pulse 92, temperature 98.5 F (36.9 C), temperature source Oral,  resp. rate 18, height 5\' 10"  (1.778 m), weight 73.8 kg (162 lb 11.2 oz), SpO2 95 %. No intake or output data in the 24 hours ending 02/24/17 1005 Filed Weights   02/24/17 0650  Weight: 73.8 kg (162 lb 11.2 oz)    Examination: General: No acute respiratory distress Lungs: Diffuse expiratory wheezing without prolonged expiratory phase with modest air movement throughout all fields and no focal crackles Cardiovascular: Regular rate and rhythm without murmur gallop or rub normal S1 and S2 Abdomen: Nontender, nondistended, soft, bowel sounds positive, no rebound, no ascites, no appreciable mass Extremities: No significant cyanosis, clubbing, or edema bilateral lower extremities  CBC:  Recent Labs Lab 02/23/17 1923  WBC 9.9  NEUTROABS 7.8*  HGB 12.7*  HCT 37.9*  MCV 92.9  PLT PLATELET CLUMPING, SUGGEST RECOLLECTION OF SAMPLE IN CITRATE TUBE.   Basic Metabolic Panel:  Recent Labs Lab 02/23/17 1923 02/24/17 0454  NA 134* 140  K 2.6* 4.6  CL 103 109  CO2 24 22  GLUCOSE 94 149*  BUN 10 15  CREATININE 0.86 1.16  CALCIUM 7.0* 9.2  MG  --  2.1   GFR: Estimated Creatinine Clearance: 83 mL/min (by C-G formula based on SCr of 1.16 mg/dL).  Liver Function Tests: No results for input(s): AST, ALT, ALKPHOS, BILITOT, PROT, ALBUMIN in the last 168 hours. No results for input(s): LIPASE, AMYLASE in the last 168 hours. No results for input(s): AMMONIA in the last 168 hours.   Scheduled Meds: . methylPREDNISolone (SOLU-MEDROL) injection  40 mg Intravenous  Q8H  . sodium chloride flush  3 mL Intravenous Q12H     LOS: 1 day   Aaron BloodJeffrey T. Kiowa Hollar, MD [facility] Office  [phone] Pager - Text Page per Amion as per below:  On-Call/Text Page:      [email or web address]      password [email or web address]  If 7PM-7AM, please contact night-coverage [email or web address] Password [email or web address] 02/24/2017, 10:05 AM

## 2017-02-24 NOTE — Clinical Social Work Note (Signed)
CSW acknowledges consult. Per RN, family had questions about medications. RNCM aware.  CSW signing off. Consult again if any social work needs arise.  Charlynn CourtSarah Velvia Mehrer, CSW [phone]

## 2017-02-24 NOTE — ED Notes (Signed)
Patient ambulating to restroom

## 2017-02-24 NOTE — Progress Notes (Addendum)
RT assessed patient and per RT protocol assessment patient scores treatment dosage of BID and PRN. Patient states he takes treatments BID and PRN at home as needed.Patient is being placed on home regimen. Patient vitals are stable, sats are 98% on 2LNC, BS are clear and diminished in lower bases with slight expiratory wheeze. RT will continue to monitor.

## 2017-02-24 NOTE — Care Management Note (Addendum)
Case Management Note  Patient Details  Name: [name] MRN: [id number] Date of Birth: [birth year]  Subjective/Objective:        Pt admitted with asthma excerbation            Action/Plan:  PTA from home independent with girlfriend.  Pt confirmed he does not have PCP or medication assistance.  Pt is in agreement with CM setting up PCP appt - Appt set up for 03/15/17 at 9:30am.  CM explained [facility].  CM provided pt MATCH letter in case pt discharges during hours that pharmacy is closed.  Expected Discharge Date:                  Expected Discharge Plan:  Home/Self Care  In-House Referral:     Discharge planning Services  CM Consult  Post Acute Care Choice:    Choice offered to:     DME Arranged:    DME Agency:     HH Arranged:    HH Agency:     Status of Service:  In process, will continue to follow  If discussed at Long Length of Stay Meetings, dates discussed:    Additional Comments:  Cherylann ParrClaxton, Meighan Treto S, RN 02/24/2017, 10:28 AM

## 2017-02-25 DIAGNOSIS — J4551 Severe persistent asthma with (acute) exacerbation: Secondary | ICD-10-CM

## 2017-02-25 MED ORDER — FLUTICASONE PROPIONATE HFA 110 MCG/ACT IN AERO: 2.0000 | INHALATION_SPRAY | Freq: Two times a day (BID) | RESPIRATORY_TRACT | 0 refills | Status: DC

## 2017-02-25 MED ORDER — PREDNISONE 10 MG PO TABS: ORAL_TABLET | ORAL | 0 refills | Status: DC

## 2017-02-25 MED ORDER — ALBUTEROL SULFATE (2.5 MG/3ML) 0.083% IN NEBU: 2.5000 mg | INHALATION_SOLUTION | Freq: Two times a day (BID) | RESPIRATORY_TRACT | 0 refills | Status: DC

## 2017-02-25 MED ORDER — ALBUTEROL SULFATE HFA 108 (90 BASE) MCG/ACT IN AERS: 1.0000 | INHALATION_SPRAY | RESPIRATORY_TRACT | 0 refills | Status: DC | PRN

## 2017-02-25 MED ORDER — LORATADINE 10 MG PO TABS: 10.0000 mg | ORAL_TABLET | Freq: Every day | ORAL | 0 refills | Status: DC | PRN

## 2017-02-25 NOTE — Discharge Instructions (Signed)

## 2017-02-25 NOTE — Discharge Summary (Signed)
DISCHARGE SUMMARY  Aaron [name]  MR#: [id number]  DOB:08/22/1970  Date of Admission: 02/23/2017 Date of Discharge: 02/25/2017  Attending Physician:Addisyn Leclaire T  Patient's XBJ:[id number], Pcp Not In  Consults:  None   Disposition: d/c home   Follow-up Appts: Follow-up Information    [facility] Follow up on 03/15/2017.   Why:  At 9:30AM  Contact information: [address] [place] [address] [phone]          Tests Needing Follow-up: -Consider formal PFTs in outpatient setting to clarify asthma versus emphysema  Discharge Diagnoses: Acute hypoxic respiratory failure Acute bronchospasm exacerbation Tobacco abuse Hypokalemia  Initial presentation: 46 y.o.malewith history of asthma and ongoing tobacco abuse who presented to the ED w/ acute respiratory distress and wheezing which had been slowly building over 2-3 days.  In the ED he was found to have a sat of 82%.    Hospital Course:  Acute hypoxic respiratory failure - acute bronchospasm exacerbation Unclear if this is true asthma or rather the early stages of emphysema - slowly resolved w/ neb and steroid therapy - no evidence of acute infection - counseled extensively as to the absolute need to stop smoking in the directly to his respiratory issues - has improved nicely by 10/27 is clear for discharge home - exam at time of discharge reveals no wheezing whatsoever - patient counseled on use of maintenance steroid inhaler versus rescue albuterol inhaler/nebulizer - patient counseled to return to the emergency room immediately should he develop severe shortness of breath which does not rapidly improve with use of MDI/neb at home  Tobacco abuse The patient has been counseled as to the absolute need to discontinue tobacco abuse and the fact that his pulmonary disease will undoubtedly accelerate should he continue  Hypokalemia Corrected - likely related to  high dose albuterol admin   Allergies as of 02/25/2017   No Known Allergies     Medication List    TAKE these medications   albuterol 108 (90 Base) MCG/ACT inhaler Commonly known as:  PROVENTIL HFA;VENTOLIN HFA Inhale 1-2 puffs into the lungs every 4 (four) hours as needed for wheezing or shortness of breath. What changed:  when to take this   albuterol (2.5 MG/3ML) 0.083% nebulizer solution Commonly known as:  PROVENTIL Take 3 mLs (2.5 mg total) by nebulization 2 (two) times daily. What changed:  You were already taking a medication with the same name, and this prescription was added. Make sure you understand how and when to take each.   fluticasone 110 MCG/ACT inhaler Commonly known as:  FLOVENT HFA Inhale 2 puffs into the lungs 2 (two) times daily.   loratadine 10 MG tablet Commonly known as:  CLARITIN Take 1 tablet (10 mg total) by mouth daily as needed for allergies. Use Claritin, Zyrtec, or any similar over the counter anti-histamine daily (store brand is fine too) during changes in seasons when you normally experience difficulty with wheezing.   predniSONE 10 MG tablet Commonly known as:  DELTASONE Take 4 tablets a day on 10/28 and 10/29 THEN take 2 tablets a day on 10/30 and 10/31 THEN take 1 tablet a day on 11/1 and 11/2 THEN STOP       Day of Discharge BP 108/73   Pulse 72   Temp 97.9 F (36.6 C) (Oral)   Resp (!) 21   Ht 5\' 10"  (1.778 m)   Wt 73.8 kg (162 lb 11.2 oz)   SpO2 90%  BMI 23.34 kg/m   Physical Exam: General: No acute respiratory distress Lungs: Clear to auscultation bilaterally without wheezes or crackles Cardiovascular: Regular rate and rhythm without murmur gallop or rub normal S1 and S2 Abdomen: Nontender, nondistended, soft, bowel sounds positive, no rebound, no ascites, no appreciable mass Extremities: No significant cyanosis, clubbing, or edema bilateral lower extremities  Basic Metabolic Panel:  Recent Labs Lab 02/23/17 1923  02/24/17 0454  NA 134* 140  K 2.6* 4.6  CL 103 109  CO2 24 22  GLUCOSE 94 149*  BUN 10 15  CREATININE 0.86 1.16  CALCIUM 7.0* 9.2  MG  --  2.1    CBC:  Recent Labs Lab 02/23/17 1923  WBC 9.9  NEUTROABS 7.8*  HGB 12.7*  HCT 37.9*  MCV 92.9  PLT PLATELET CLUMPING, SUGGEST RECOLLECTION OF SAMPLE IN CITRATE TUBE.    Recent Results (from the past 240 hour(s))  MRSA PCR Screening     Status: None   Collection Time: 02/24/17  6:45 AM  Result Value Ref Range Status   MRSA by PCR NEGATIVE NEGATIVE Final    Comment:        The GeneXpert MRSA Assay (FDA approved for NASAL specimens only), is one component of a comprehensive MRSA colonization surveillance program. It is not intended to diagnose MRSA infection nor to guide or monitor treatment for MRSA infections.      Time spent in discharge (includes decision making & examination of pt): 30 minutes  02/25/2017, 10:42 AM   Lonia BloodJeffrey T. Tayven Renteria, MD [facility] Office  [phone] Pager [phone]  On-Call/Text Page:      [email or web address]      password [email or web address]

## 2017-02-25 NOTE — Progress Notes (Signed)
Reviewed discharge paperwork with pt and girlfriend at bedside. Reviewed new prescriptions and all medications and when to take. Pt verbalizes understanding. Reviewed s/s of exacerbation and infection and follow up MD appt.

## 2017-02-27 ENCOUNTER — Encounter (HOSPITAL_COMMUNITY): Payer: Self-pay | Admitting: Emergency Medicine

## 2017-03-15 ENCOUNTER — Encounter: Payer: Self-pay | Admitting: Family Medicine

## 2017-03-15 ENCOUNTER — Ambulatory Visit (INDEPENDENT_AMBULATORY_CARE_PROVIDER_SITE_OTHER): Payer: Self-pay | Admitting: Family Medicine

## 2017-03-15 VITALS — BP 128/72 | HR 88 | Temp 98.6°F | Ht 70.0 in | Wt 170.0 lb

## 2017-03-15 DIAGNOSIS — Z716 Tobacco abuse counseling: Secondary | ICD-10-CM

## 2017-03-15 DIAGNOSIS — J45909 Unspecified asthma, uncomplicated: Secondary | ICD-10-CM

## 2017-03-15 DIAGNOSIS — Z23 Encounter for immunization: Secondary | ICD-10-CM

## 2017-03-15 NOTE — Patient Instructions (Addendum)
Asthma, Adult Asthma is a condition of the lungs in which the airways tighten and narrow. Asthma can make it hard to breathe. Asthma cannot be cured, but medicine and lifestyle changes can help control it. Asthma may be started (triggered) by:  Animal skin flakes (dander).  Dust.  Cockroaches.  Pollen.  Mold.  Smoke.  Cleaning products.  Hair sprays or aerosol sprays.  Paint fumes or strong smells.  Cold air, weather changes, and winds.  Crying or laughing hard.  Stress.  Certain medicines or drugs.  Foods, such as dried fruit, potato chips, and sparkling grape juice.  Infections or conditions (colds, flu).  Exercise.  Certain medical conditions or diseases.  Exercise or tiring activities.  Follow these instructions at home:  Take medicine as told by your doctor.  Use a peak flow meter as told by your doctor. A peak flow meter is a tool that measures how well the lungs are working.  Record and keep track of the peak flow meter's readings.  Understand and use the asthma action plan. An asthma action plan is a written plan for taking care of your asthma and treating your attacks.  To help prevent asthma attacks: ? Do not smoke. Stay away from secondhand smoke. ? Change your heating and air conditioning filter often. ? Limit your use of fireplaces and wood stoves. ? Get rid of pests (such as roaches and mice) and their droppings. ? Throw away plants if you see mold on them. ? Clean your floors. Dust regularly. Use cleaning products that do not smell. ? Have someone vacuum when you are not home. Use a vacuum cleaner with a HEPA filter if possible. ? Replace carpet with wood, tile, or vinyl flooring. Carpet can trap animal skin flakes and dust. ? Use allergy-proof pillows, mattress covers, and box spring covers. ? Wash bed sheets and blankets every week in hot water and dry them in a dryer. ? Use blankets that are made of polyester or cotton. ? Clean bathrooms  and kitchens with bleach. If possible, have someone repaint the walls in these rooms with mold-resistant paint. Keep out of the rooms that are being cleaned and painted. ? Wash hands often. Contact a doctor if:  You have make a whistling sound when breaking (wheeze), have shortness of breath, or have a cough even if taking medicine to prevent attacks.  The colored mucus you cough up (sputum) is thicker than usual.  The colored mucus you cough up changes from clear or white to yellow, green, gray, or bloody.  You have problems from the medicine you are taking such as: ? A rash. ? Itching. ? Swelling. ? Trouble breathing.  You need reliever medicines more than 2-3 times a week.  Your peak flow measurement is still at 50-79% of your personal best after following the action plan for 1 hour.  You have a fever. Get help right away if:  You seem to be worse and are not responding to medicine during an asthma attack.  You are short of breath even at rest.  You get short of breath when doing very little activity.  You have trouble eating, drinking, or talking.  You have chest pain.  You have a fast heartbeat.  Your lips or fingernails start to turn blue.  You are light-headed, dizzy, or faint.  Your peak flow is less than 50% of your personal best. This information is not intended to replace advice given to you by your health care provider. Make   sure you discuss any questions you have with your health care provider. Document Released: 10/05/2007 Document Revised: 09/24/2015 Document Reviewed: 11/15/2012 Elsevier Interactive Patient Education  2017 [employer] Bupropion sustained-release tablets (smoking cessation) What is this medicine? BUPROPION (byoo PROE pee on) is used to help people quit smoking. This medicine may be used for other purposes; ask your health care provider or pharmacist if you have questions. COMMON BRAND NAME(S): Buproban, Zyban What should I tell my  health care provider before I take this medicine? They need to know if you have any of these conditions: -an eating disorder, such as anorexia or bulimia -bipolar disorder or psychosis -diabetes or high blood sugar, treated with medication -glaucoma -head injury or brain tumor -heart disease, previous heart attack, or irregular heart beat -high blood pressure -kidney or liver disease -seizures -suicidal thoughts or a previous suicide attempt -Tourette's syndrome -weight loss -an unusual or allergic reaction to bupropion, other medicines, foods, dyes, or preservatives -breast-feeding -pregnant or trying to become pregnant How should I use this medicine? Take this medicine by mouth with a glass of water. Follow the directions on the prescription label. You can take it with or without food. If it upsets your stomach, take it with food. Do not cut, crush or chew this medicine. Take your medicine at regular intervals. If you take this medicine more than once a day, take your second dose at least 8 hours after you take your first dose. To limit difficulty in sleeping, avoid taking this medicine at bedtime. Do not take your medicine more often than directed. Do not stop taking this medicine suddenly except upon the advice of your doctor. Stopping this medicine too quickly may cause serious side effects. A special MedGuide will be given to you by the pharmacist with each prescription and refill. Be sure to read this information carefully each time. Talk to your pediatrician regarding the use of this medicine in children. Special care may be needed. Overdosage: If you think you have taken too much of this medicine contact a poison control center or emergency room at once. NOTE: This medicine is only for you. Do not share this medicine with others. What if I miss a dose? If you miss a dose, skip the missed dose and take your next tablet at the regular time. There should be at least 8 hours between  doses. Do not take double or extra doses. What may interact with this medicine? Do not take this medicine with any of the following medications: -linezolid -MAOIs like Azilect, Carbex, Eldepryl, Marplan, Nardil, and Parnate -methylene blue (injected into a vein) -other medicines that contain bupropion like Wellbutrin This medicine may also interact with the following medications: -alcohol -certain medicines for anxiety or sleep -certain medicines for blood pressure like metoprolol, propranolol -certain medicines for depression or psychotic disturbances -certain medicines for HIV or AIDS like efavirenz, lopinavir, nelfinavir, ritonavir -certain medicines for irregular heart beat like propafenone, flecainide -certain medicines for Parkinson's disease like amantadine, levodopa -certain medicines for seizures like carbamazepine, phenytoin, phenobarbital -cimetidine -clopidogrel -cyclophosphamide -digoxin -furazolidone -isoniazid -nicotine -orphenadrine -procarbazine -steroid medicines like prednisone or cortisone -stimulant medicines for attention disorders, weight loss, or to stay awake -tamoxifen -theophylline -thiotepa -ticlopidine -tramadol -warfarin This list may not describe all possible interactions. Give your health care provider a list of all the medicines, herbs, non-prescription drugs, or dietary supplements you use. Also tell them if you smoke, drink alcohol, or use illegal drugs. Some items may interact with your medicine. What  should I watch for while using this medicine? Visit your doctor or health care professional for regular checks on your progress. This medicine should be used together with a patient support program. It is important to participate in a behavioral program, counseling, or other support program that is recommended by your health care professional. Patients and their families should watch out for new or worsening thoughts of suicide or depression. Also  watch out for sudden changes in feelings such as feeling anxious, agitated, panicky, irritable, hostile, aggressive, impulsive, severely restless, overly excited and hyperactive, or not being able to sleep. If this happens, especially at the beginning of treatment or after a change in dose, call your health care professional. Avoid alcoholic drinks while taking this medicine. Drinking excessive alcoholic beverages, using sleeping or anxiety medicines, or quickly stopping the use of these agents while taking this medicine may increase your risk for a seizure. Do not drive or use heavy machinery until you know how this medicine affects you. This medicine can impair your ability to perform these tasks. Do not take this medicine close to bedtime. It may prevent you from sleeping. Your mouth may get dry. Chewing sugarless gum or sucking hard candy, and drinking plenty of water may help. Contact your doctor if the problem does not go away or is severe. Do not use nicotine patches or chewing gum without the advice of your doctor or health care professional while taking this medicine. You may need to have your blood pressure taken regularly if your doctor recommends that you use both nicotine and this medicine together. What side effects may I notice from receiving this medicine? Side effects that you should report to your doctor or health care professional as soon as possible: -allergic reactions like skin rash, itching or hives, swelling of the face, lips, or tongue -breathing problems -changes in vision -confusion -elevated mood, decreased need for sleep, racing thoughts, impulsive behavior -fast or irregular heartbeat -hallucinations, loss of contact with reality -increased blood pressure -redness, blistering, peeling or loosening of the skin, including inside the mouth -seizures -suicidal thoughts or other mood changes -unusually weak or tired -vomiting Side effects that usually do not require  medical attention (report to your doctor or health care professional if they continue or are bothersome): -constipation -headache -loss of appetite -nausea -tremors -weight loss This list may not describe all possible side effects. Call your doctor for medical advice about side effects. You may report side effects to FDA at 1-800-FDA-1088. Where should I keep my medicine? Keep out of the reach of children. Store at room temperature between 20 and 25 degrees C (68 and 77 degrees F). Protect from light. Keep container tightly closed. Throw away any unused medicine after the expiration date. NOTE: This sheet is a summary. It may not cover all possible information. If you have questions about this medicine, talk to your doctor, pharmacist, or health care provider.  2018 Elsevier/Gold Standard (2015-10-09 13:49:28)     Tobacco Use Disorder Tobacco use disorder (TUD) is a mental disorder. It is the long-term use of tobacco in spite of related health problems or difficulty with normal life activities. Tobacco is most commonly smoked as cigarettes and less commonly as cigars or pipes. Smokeless chewing tobacco and snuff are also popular. People with TUD get a feeling of extreme pleasure (euphoria) from using tobacco and have a desire to use it again and again. Repeated use of tobacco can cause problems. The addictive effects of tobacco are due mainly  tothe ingredient nicotine. Nicotine also causes a rush of adrenaline (epinephrine) in the body. This leads to increased blood pressure, heart rate, and breathing rate. These changes may cause problems for people with high blood pressure, weak hearts, or lung disease. High doses of nicotine in children and pets can lead to seizures and death. Tobacco contains a number of other unsafe chemicals. These chemicals are especially harmful when inhaled as smoke and can damage almost every organ in the body. Smokers live shorter lives than nonsmokers and are at  risk of dying from a number of diseases and cancers. Tobacco smoke can also cause health problems for nonsmokers (due to inhaling secondhand smoke). Smoking is also a fire hazard. TUD usually starts in the late teenage years and is most common in young adults between the ages of 3718 and 25 years. People who start smoking earlier in life are more likely to continue smoking as adults. TUD is somewhat more common in men than women. People with TUD are at higher risk for using alcohol and other drugs of abuse. What increases the risk? Risk factors for TUD include:  Having family members with the disorder.  Being around people who use tobacco.  Having an existing mental health issue such as schizophrenia, depression, bipolar disorder, ADHD, or posttraumatic stress disorder (PTSD).  What are the signs or symptoms? People with tobacco use disorder have two or more of the following signs and symptoms within 12 months:  Use of more tobacco over a longer period than intended.  Not able to cut down or control tobacco use.  A lot of time spent obtaining or using tobacco.  Strong desire or urge to use tobacco (craving). Cravings may last for 6 months or longer after quitting.  Use of tobacco even when use leads to major problems at work, school, or home.  Use of tobacco even when use leads to relationship problems.  Giving up or cutting down on important life activities because of tobacco use.  Repeatedly using tobacco in situations where it puts you or others in physical danger, like smoking in bed.  Use of tobacco even when it is known that a physical or mental problem is likely related to tobacco use. ? Physical problems are numerous and may include chronic bronchitis, emphysema, lung and other cancers, gum disease, high blood pressure, heart disease, and stroke. ? Mental problems caused by tobacco may include difficulty sleeping and anxiety.  Need to use greater amounts of tobacco to get the  same effect. This means you have developed a tolerance.  Withdrawal symptoms as a result of stopping or rapidly cutting back use. These symptoms may last a month or more after quitting and include the following: ? Depressed, anxious, or irritable mood. ? Difficulty concentrating. ? Increased appetite. ? Restlessness or trouble sleeping. ? Use of tobacco to avoid withdrawal symptoms.  How is this diagnosed? Tobacco use disorder is diagnosed by your health care provider. A diagnosis may be made by:  Your health care provider asking questions about your tobacco use and any problems it may be causing.  A physical exam.  Lab tests.  You may be referred to a mental health professional or addiction specialist.  The severity of tobacco use disorder depends on the number of signs and symptoms you have:  Mild-Two or three symptoms.  Moderate-Four or five symptoms.  Severe-Six or more symptoms.  How is this treated? Many people with tobacco use disorder are unable to quit on their own and  need help. Treatment options include the following:  Nicotine replacement therapy (NRT). NRT provides nicotine without the other harmful chemicals in tobacco. NRT gradually lowers the dosage of nicotine in the body and reduces withdrawal symptoms. NRT is available in over-the-counter forms (gum, lozenges, and skin patches) as well as prescription forms (mouth inhaler and nasal spray).  Medicines.This may include: ? Antidepressant medicine that may reduce nicotine cravings. ? A medicine that acts on nicotine receptors in the brain to reduce cravings and withdrawal symptoms. It may also block the effects of tobacco in people with TUD who relapse.  Counseling or talk therapy. A form of talk therapy called behavioral therapy is commonly used to treat people with TUD. Behavioral therapy looks at triggers for tobacco use, how to avoid them, and how to cope with cravings. It is most effective in person or by  phone but is also available in self-help forms (books and Internet websites).  Support groups. These provide emotional support, advice, and guidance for quitting tobacco.  The most effective treatment for TUD is usually a combination of medicine, talk therapy, and support groups. Follow these instructions at home:  Keep all follow-up visits as directed by your health care provider. This is important.  Take medicines only as directed by your health care provider.  Check with your health care provider before starting new prescription or over-the-counter medicines. Contact a health care provider if:  You are not able to take your medicines as prescribed.  Treatment is not helping your TUD and your symptoms get worse. Get help right away if:  You have serious thoughts about hurting yourself or others.  You have trouble breathing, chest pain, sudden weakness, or sudden numbness in part of your body. This information is not intended to replace advice given to you by your health care provider. Make sure you discuss any questions you have with your health care provider. Document Released: 12/23/2003 Document Revised: 12/20/2015 Document Reviewed: 06/14/2013 Elsevier Interactive Patient Education  [employer].

## 2017-03-15 NOTE — Progress Notes (Signed)
Patient ID: Orvilla Fusarrick L Laduca, male    DOB: 08-28-1970, 46 y.o.   MRN: [id number]  PCP: Bing NeighborsHarris, Arby Dahir S, [name]  Chief Complaint  Patient presents with  . Establish Care  . Hospitalization Follow-up    Subjective:  HPI Latrail Elberta SpanielL Wedge is a 46 y.o. male presents to establish care and hospital follow-up. Medical problems significant for COPD, current smoker, and  marijuana use. Jaeven was recently hospitalized 02/23/2017 for an acute severe asthma exacerbation. Prior to 02/23/2017 exacerbation, he had presented the ED for asthma related complains twice in less than 30 days. Elim had been without asthma medication previously as he is uninsured and was unable to afford inhalers. Today, he reports adherence with albuterol and Flovent inhaler. He also takes cetrizine daily as he has discovered his last few exacerbations were related to prolonged exposure to outdoor allergens. He has no other complaints today. Denies any recent wheezing, shortness of breath, coughing, or chest tightness.  Social History   Socioeconomic History  . Marital status: Married    Spouse name: Not on file  . Number of children: Not on file  . Years of education: Not on file  . Highest education level: Not on file  Social Needs  . Financial resource strain: Not on file  . Food insecurity - worry: Not on file  . Food insecurity - inability: Not on file  . Transportation needs - medical: Not on file  . Transportation needs - non-medical: Not on file  Occupational History  . Not on file  Tobacco Use  . Smoking status: Current Every Day Smoker    Packs/day: 0.50    Types: Cigarettes  . Smokeless tobacco: Never Used  Substance and Sexual Activity  . Alcohol use: Yes    Comment: occasion  . Drug use: Yes    Types: Marijuana  . Sexual activity: Yes  Other Topics Concern  . Not on file  Social History Narrative   ** Merged History Encounter **        Family History  Problem Relation Age of Onset  .  Diabetes Mother   . Hypertension Father    Review of Systems Constitutional: Negative for fever, chills, diaphoresis, activity change, appetite change and fatigue. HENT: Negative for ear pain, nosebleeds, congestion, facial swelling, rhinorrhea, neck pain, neck stiffness and ear discharge.  Eyes: Negative for pain, discharge, redness, itching and visual disturbance. Respiratory: Negative for cough, choking, chest tightness, shortness of breath, wheezing and stridor.  Cardiovascular: Negative for chest pain, palpitations and leg swelling. Gastrointestinal: Negative for abdominal distention. Genitourinary: Negative for dysuria, urgency, frequency, hematuria, flank pain, decreased urine volume, difficulty urinating and dyspareunia.  Musculoskeletal: Negative for back pain, joint swelling, arthralgia and gait problem. Neurological: Negative for dizziness, tremors, seizures, syncope, facial asymmetry, speech difficulty, weakness, light-headedness, numbness and headaches.  Hematological: Negative for adenopathy. Does not bruise/bleed easily. Psychiatric/Behavioral: Negative for hallucinations, behavioral problems, confusion, dysphoric mood, decreased concentration and agitation.   Patient Active Problem List   Diagnosis Date Noted  . Acute on chronic respiratory failure with hypoxia (HCC) 02/23/2017    No Known Allergies  Prior to Admission medications   Medication Sig Start Date End Date Taking? Authorizing Provider  albuterol (PROVENTIL HFA;VENTOLIN HFA) 108 (90 Base) MCG/ACT inhaler Inhale 1-2 puffs into the lungs every 6 (six) hours as needed for wheezing or shortness of breath. 02/01/17  Yes Sharen HeckGibbons, Claudia J, PA-C  albuterol (PROVENTIL) (2.5 MG/3ML) 0.083% nebulizer solution Take 3 mLs (2.5 mg total) by  nebulization 2 (two) times daily. 02/25/17  Yes Lonia Blood, MD  albuterol (PROVENTIL) (5 MG/ML) 0.5% nebulizer solution Take 0.5 mLs (2.5 mg total) by nebulization every 6 (six)  hours as needed for wheezing or shortness of breath. 02/01/17  Yes Sharen Heck J, PA-C  fluticasone (FLOVENT HFA) 110 MCG/ACT inhaler Inhale 2 puffs into the lungs 2 (two) times daily. 02/25/17 02/25/18 Yes Lonia Blood, MD  loratadine (CLARITIN) 10 MG tablet Take 1 tablet (10 mg total) by mouth daily as needed for allergies. Use Claritin, Zyrtec, or any similar over the counter anti-histamine daily (store brand is fine too) during changes in seasons when you normally experience difficulty with wheezing. 02/25/17 02/25/18 Yes Lonia Blood, MD  albuterol (PROVENTIL HFA;VENTOLIN HFA) 108 (90 Base) MCG/ACT inhaler Inhale 1-2 puffs into the lungs every 4 (four) hours as needed for wheezing or shortness of breath. Patient not taking: Reported on 03/15/2017 02/25/17   Lonia Blood, MD  fluticasone (FLOVENT HFA) 110 MCG/ACT inhaler Inhale 2 puffs into the lungs 2 (two) times daily. Patient not taking: Reported on 02/25/2016 03/24/12   Palumbo, April, MD  montelukast (SINGULAIR) 10 MG tablet Take 1 tablet (10 mg total) by mouth at bedtime. 02/01/17 03/03/17  Liberty Handy, PA-C  predniSONE (DELTASONE) 10 MG tablet Take 4 tablets a day on 10/28 and 10/29 THEN take 2 tablets a day on 10/30 and 10/31 THEN take 1 tablet a day on 11/1 and 11/2 THEN STOP Patient not taking: Reported on 03/15/2017 02/25/17   Lonia Blood, MD  predniSONE (DELTASONE) 50 MG tablet Take 1 pill daily Patient not taking: Reported on 03/15/2017 02/01/17   Liberty Handy, PA-C    Past Medical, Surgical Family and Social History reviewed and updated.    Objective:   Today's Vitals   03/15/17 0850  BP: 128/72  Pulse: 88  Temp: 98.6 F (37 C)  TempSrc: Oral  SpO2: 98%  Weight: 170 lb (77.1 kg)  Height: 5\' 10"  (1.778 m)    Wt Readings from Last 3 Encounters:  03/15/17 170 lb (77.1 kg)  02/24/17 162 lb 11.2 oz (73.8 kg)  01/09/17 180 lb (81.6 kg)    Physical Exam Constitutional: Patient  appears well-developed and well-nourished. No distress. HENT: Normocephalic, atraumatic, External right and left ear normal. Oropharynx is clear and moist.  Eyes: Conjunctivae and EOM are normal. PERRLA, no scleral icterus. Neck: Normal ROM. Neck supple. No JVD. No tracheal deviation. No thyromegaly. CVS: RRR, S1/S2 +, no murmurs, no gallops, no carotid bruit.  Pulmonary: Effort and breath sounds normal, no stridor, rhonchi, wheezes, rales.  Abdominal: Soft. BS +, no distension, tenderness, rebound or guarding.  Musculoskeletal: Normal range of motion. No edema and no tenderness.  Lymphadenopathy: No lymphadenopathy noted, cervical, inguinal or axillary Neuro: Alert. Normal  muscle tone coordination. No cranial nerve deficit. Skin: Skin is warm and dry. No rash noted. Not diaphoretic. No erythema. No pallor. Psychiatric: Normal mood and affect. Behavior, judgment, thought content normal.  Assessment & Plan:  1. Asthma, unspecified asthma severity, unspecified whether complicated, unspecified whether persistent. Controlled today -Continue Flovent 2 puffs twice daily and Albuterol inhaler 2 puffs every 4-6 hours as needed. -Patient was educated to come in clinic for follow-up at the initial sign of an exacerbation to prevent re-hospitalization  2. Encounter for smoking cessation counseling -Smoking cessation encouraged and discussed the use of bupropion to aid in cessation when chooses to quit.   3. Need for Tdap vaccination -  Tdap vaccine greater than or equal to 7yo IM  Immunization History  Administered Date(s) Administered  . Influenza,inj,Quad PF,6+ Mos 02/25/2017  . Pneumococcal Polysaccharide-23 02/25/2017  . Tdap 03/15/2017     Patient [facility]. He was educated about access to chronic medication through [facility] and will advise me here at the clinic if he is unable to obtain medications  therefore ref[facility].   RTC: CPE 6 months and sooner if needed.  Godfrey PickKimberly S. Tiburcio PeaHarris, MSN, [name]-C [facility]  [address] [place], [place] [address] [phone]

## 2017-04-21 ENCOUNTER — Telehealth: Payer: Self-pay

## 2017-04-21 MED ORDER — ALBUTEROL SULFATE (2.5 MG/3ML) 0.083% IN NEBU: 2.5000 mg | INHALATION_SOLUTION | Freq: Two times a day (BID) | RESPIRATORY_TRACT | 0 refills | Status: DC

## 2017-04-21 NOTE — Telephone Encounter (Signed)
Medication sent to pharmacy  

## 2017-06-05 ENCOUNTER — Emergency Department (HOSPITAL_COMMUNITY): Payer: Self-pay

## 2017-06-05 ENCOUNTER — Encounter (HOSPITAL_COMMUNITY): Payer: Self-pay

## 2017-06-05 DIAGNOSIS — J4552 Severe persistent asthma with status asthmaticus: Secondary | ICD-10-CM | POA: Diagnosis present

## 2017-06-05 DIAGNOSIS — E872 Acidosis: Secondary | ICD-10-CM | POA: Diagnosis present

## 2017-06-05 DIAGNOSIS — F17210 Nicotine dependence, cigarettes, uncomplicated: Secondary | ICD-10-CM | POA: Diagnosis present

## 2017-06-05 DIAGNOSIS — J9601 Acute respiratory failure with hypoxia: Secondary | ICD-10-CM | POA: Diagnosis present

## 2017-06-05 DIAGNOSIS — N179 Acute kidney failure, unspecified: Secondary | ICD-10-CM | POA: Diagnosis not present

## 2017-06-05 DIAGNOSIS — Z7951 Long term (current) use of inhaled steroids: Secondary | ICD-10-CM

## 2017-06-05 DIAGNOSIS — J9602 Acute respiratory failure with hypercapnia: Secondary | ICD-10-CM | POA: Diagnosis present

## 2017-06-05 DIAGNOSIS — J441 Chronic obstructive pulmonary disease with (acute) exacerbation: Principal | ICD-10-CM | POA: Diagnosis present

## 2017-06-05 DIAGNOSIS — F419 Anxiety disorder, unspecified: Secondary | ICD-10-CM | POA: Diagnosis present

## 2017-06-05 DIAGNOSIS — Z79899 Other long term (current) drug therapy: Secondary | ICD-10-CM

## 2017-06-05 DIAGNOSIS — I952 Hypotension due to drugs: Secondary | ICD-10-CM | POA: Diagnosis not present

## 2017-06-05 DIAGNOSIS — F1290 Cannabis use, unspecified, uncomplicated: Secondary | ICD-10-CM | POA: Diagnosis present

## 2017-06-05 DIAGNOSIS — T41295A Adverse effect of other general anesthetics, initial encounter: Secondary | ICD-10-CM | POA: Diagnosis not present

## 2017-06-05 MED ORDER — ALBUTEROL SULFATE (2.5 MG/3ML) 0.083% IN NEBU
5.0000 mg | INHALATION_SOLUTION | Freq: Once | RESPIRATORY_TRACT | Status: AC
  Administered 2017-06-05: 5 mg via RESPIRATORY_TRACT
  Filled 2017-06-05: qty 6

## 2017-06-05 NOTE — ED Triage Notes (Signed)
Pt complains of an asthma attack since earlier today, no relief from his inhaler or breathing treatments

## 2017-06-06 ENCOUNTER — Emergency Department (HOSPITAL_COMMUNITY): Payer: Self-pay

## 2017-06-06 ENCOUNTER — Inpatient Hospital Stay (HOSPITAL_COMMUNITY)
Admission: EM | Admit: 2017-06-06 | Discharge: 2017-06-11 | DRG: 208 | Disposition: A | Discharge status: Home / Self Care | Payer: Self-pay | Attending: Family Medicine | Admitting: Family Medicine

## 2017-06-06 DIAGNOSIS — J441 Chronic obstructive pulmonary disease with (acute) exacerbation: Secondary | ICD-10-CM | POA: Diagnosis present

## 2017-06-06 DIAGNOSIS — J4551 Severe persistent asthma with (acute) exacerbation: Secondary | ICD-10-CM

## 2017-06-06 DIAGNOSIS — Z720 Tobacco use: Secondary | ICD-10-CM | POA: Diagnosis present

## 2017-06-06 DIAGNOSIS — J45901 Unspecified asthma with (acute) exacerbation: Secondary | ICD-10-CM | POA: Diagnosis present

## 2017-06-06 DIAGNOSIS — J9601 Acute respiratory failure with hypoxia: Secondary | ICD-10-CM

## 2017-06-06 DIAGNOSIS — J9602 Acute respiratory failure with hypercapnia: Secondary | ICD-10-CM | POA: Diagnosis present

## 2017-06-06 DIAGNOSIS — J9690 Respiratory failure, unspecified, unspecified whether with hypoxia or hypercapnia: Secondary | ICD-10-CM

## 2017-06-06 DIAGNOSIS — F419 Anxiety disorder, unspecified: Secondary | ICD-10-CM | POA: Diagnosis present

## 2017-06-06 DIAGNOSIS — J45902 Unspecified asthma with status asthmaticus: Secondary | ICD-10-CM

## 2017-06-06 LAB — POCT I-STAT 3, ART BLOOD GAS (G3+)
Acid-base deficit: 4 mmol/L — ABNORMAL HIGH (ref 0.0–2.0)
Acid-base deficit: 2 mmol/L (ref 0.0–2.0)
Acid-base deficit: 2 mmol/L (ref 0.0–2.0)
BICARBONATE: 24.7 mmol/L (ref 20.0–28.0)
BICARBONATE: 26.3 mmol/L (ref 20.0–28.0)
BICARBONATE: 25.1 mmol/L (ref 20.0–28.0)
O2 SAT: 95 %
O2 Saturation: 94 %
O2 Saturation: 96 %
PCO2 ART: 64.1 mmHg — AB (ref 32.0–48.0)
PCO2 ART: 68.6 mmHg — AB (ref 32.0–48.0)
PCO2 ART: 53.4 mmHg — AB (ref 32.0–48.0)
PH ART: 7.199 — AB (ref 7.350–7.450)
PO2 ART: 88 mmHg (ref 83.0–108.0)
PO2 ART: 83 mmHg (ref 83.0–108.0)
Patient temperature: 38.2
Patient temperature: 98
TCO2: 27 mmol/L (ref 22–32)
TCO2: 28 mmol/L (ref 22–32)
TCO2: 27 mmol/L (ref 22–32)
pH, Arterial: 7.194 — CL (ref 7.350–7.450)
pH, Arterial: 7.278 — ABNORMAL LOW (ref 7.350–7.450)
pO2, Arterial: 106 mmHg (ref 83.0–108.0)

## 2017-06-06 LAB — BLOOD GAS, ARTERIAL
Acid-base deficit: 9 mmol/L — ABNORMAL HIGH (ref 0.0–2.0)
Acid-base deficit: 7.5 mmol/L — ABNORMAL HIGH (ref 0.0–2.0)
BICARBONATE: 21.6 mmol/L (ref 20.0–28.0)
Bicarbonate: 20.7 mmol/L (ref 20.0–28.0)
Drawn by: 422461
Drawn by: 257701
FIO2: 100
FIO2: 60
LHR: 16 {breaths}/min
LHR: 20 {breaths}/min
MECHVT: 580 mL
O2 Saturation: 98.6 %
O2 Saturation: 97.7 %
PATIENT TEMPERATURE: 95.4
PATIENT TEMPERATURE: 95.1
PCO2 ART: 59.4 mmHg — AB (ref 32.0–48.0)
PEEP/CPAP: 5 cmH2O
PEEP: 5 cmH2O
PO2 ART: 296 mmHg — AB (ref 83.0–108.0)
PO2 ART: 138 mmHg — AB (ref 83.0–108.0)
VT: 580 mL
pCO2 arterial: 56.6 mmHg — ABNORMAL HIGH (ref 32.0–48.0)
pH, Arterial: 7.155 — CL (ref 7.350–7.450)
pH, Arterial: 7.192 — CL (ref 7.350–7.450)

## 2017-06-06 LAB — CBC WITH DIFFERENTIAL/PLATELET
BASOS ABS: 0 10*3/uL (ref 0.0–0.1)
BASOS PCT: 0 %
EOS ABS: 0.2 10*3/uL (ref 0.0–0.7)
EOS PCT: 2 %
HCT: 38.8 % — ABNORMAL LOW (ref 39.0–52.0)
HEMOGLOBIN: 12.7 g/dL — AB (ref 13.0–17.0)
Lymphocytes Relative: 12 %
Lymphs Abs: 1.2 10*3/uL (ref 0.7–4.0)
MCH: 30.5 pg (ref 26.0–34.0)
MCHC: 32.7 g/dL (ref 30.0–36.0)
MCV: 93.3 fL (ref 78.0–100.0)
Monocytes Absolute: 0.7 10*3/uL (ref 0.1–1.0)
Monocytes Relative: 7 %
NEUTROS PCT: 79 %
Neutro Abs: 7.4 10*3/uL (ref 1.7–7.7)
PLATELETS: 164 10*3/uL (ref 150–400)
RBC: 4.16 MIL/uL — AB (ref 4.22–5.81)
RDW: 13.6 % (ref 11.5–15.5)
WBC: 9.5 10*3/uL (ref 4.0–10.5)

## 2017-06-06 LAB — COMPREHENSIVE METABOLIC PANEL
ALT: 28 U/L (ref 17–63)
ANION GAP: 9 (ref 5–15)
AST: 30 U/L (ref 15–41)
Albumin: 3.9 g/dL (ref 3.5–5.0)
Alkaline Phosphatase: 64 U/L (ref 38–126)
BUN: 14 mg/dL (ref 6–20)
CHLORIDE: 109 mmol/L (ref 101–111)
CO2: 24 mmol/L (ref 22–32)
Calcium: 8 mg/dL — ABNORMAL LOW (ref 8.9–10.3)
Creatinine, Ser: 1.33 mg/dL — ABNORMAL HIGH (ref 0.61–1.24)
GFR calc non Af Amer: >60 mL/min (ref >60)
Glucose, Bld: 149 mg/dL — ABNORMAL HIGH (ref 65–99)
POTASSIUM: 3.4 mmol/L — AB (ref 3.5–5.1)
Sodium: 142 mmol/L (ref 135–145)
TOTAL PROTEIN: 6.3 g/dL — AB (ref 6.5–8.1)
Total Bilirubin: 0.4 mg/dL (ref 0.3–1.2)

## 2017-06-06 LAB — CBC
HEMATOCRIT: 42.2 % (ref 39.0–52.0)
Hemoglobin: 14.3 g/dL (ref 13.0–17.0)
MCH: 30.8 pg (ref 26.0–34.0)
MCHC: 33.9 g/dL (ref 30.0–36.0)
MCV: 90.8 fL (ref 78.0–100.0)
Platelets: 208 10*3/uL (ref 150–400)
RBC: 4.65 MIL/uL (ref 4.22–5.81)
RDW: 13.7 % (ref 11.5–15.5)
WBC: 7.6 10*3/uL (ref 4.0–10.5)

## 2017-06-06 LAB — RAPID URINE DRUG SCREEN, HOSP PERFORMED
Amphetamines: NOT DETECTED
BARBITURATES: NOT DETECTED
Benzodiazepines: NOT DETECTED
Cocaine: NOT DETECTED
OPIATES: NOT DETECTED
TETRAHYDROCANNABINOL: POSITIVE — AB

## 2017-06-06 LAB — MRSA PCR SCREENING: MRSA by PCR: NEGATIVE

## 2017-06-06 LAB — I-STAT CHEM 8, ED
BUN: 14 mg/dL (ref 6–20)
CHLORIDE: 103 mmol/L (ref 101–111)
CREATININE: 1 mg/dL (ref 0.61–1.24)
Calcium, Ion: 1.17 mmol/L (ref 1.15–1.40)
Glucose, Bld: 104 mg/dL — ABNORMAL HIGH (ref 65–99)
HEMATOCRIT: 44 % (ref 39.0–52.0)
HEMOGLOBIN: 15 g/dL (ref 13.0–17.0)
POTASSIUM: 3.9 mmol/L (ref 3.5–5.1)
Sodium: 142 mmol/L (ref 135–145)
TCO2: 28 mmol/L (ref 22–32)

## 2017-06-06 LAB — TRIGLYCERIDES
TRIGLYCERIDES: 31 mg/dL (ref <150)
Triglycerides: 44 mg/dL (ref <150)

## 2017-06-06 LAB — MAGNESIUM: MAGNESIUM: 2.2 mg/dL (ref 1.7–2.4)

## 2017-06-06 LAB — INFLUENZA PANEL BY PCR (TYPE A & B)
INFLBPCR: NEGATIVE
Influenza A By PCR: NEGATIVE

## 2017-06-06 LAB — TROPONIN I

## 2017-06-06 LAB — PHOSPHORUS: PHOSPHORUS: 5 mg/dL — AB (ref 2.5–4.6)

## 2017-06-06 LAB — BRAIN NATRIURETIC PEPTIDE: B NATRIURETIC PEPTIDE 5: 17.6 pg/mL (ref 0.0–100.0)

## 2017-06-06 MED ORDER — ARTIFICIAL TEARS OPHTHALMIC OINT
1.0000 "application " | TOPICAL_OINTMENT | Freq: Three times a day (TID) | OPHTHALMIC | Status: DC
  Filled 2017-06-06: qty 3.5

## 2017-06-06 MED ORDER — POTASSIUM CHLORIDE 10 MEQ/100ML IV SOLN
10.0000 meq | INTRAVENOUS | Status: AC
  Administered 2017-06-06 (×2): 10 meq via INTRAVENOUS
  Filled 2017-06-06 (×2): qty 100

## 2017-06-06 MED ORDER — ARFORMOTEROL TARTRATE 15 MCG/2ML IN NEBU
15.0000 ug | INHALATION_SOLUTION | Freq: Two times a day (BID) | RESPIRATORY_TRACT | Status: DC
  Administered 2017-06-06 – 2017-06-08 (×5): 15 ug via RESPIRATORY_TRACT
  Filled 2017-06-06 (×6): qty 2

## 2017-06-06 MED ORDER — MIDAZOLAM HCL 2 MG/2ML IJ SOLN: 2.0000 mg | INTRAMUSCULAR | Status: DC | PRN

## 2017-06-06 MED ORDER — FENTANYL CITRATE (PF) 100 MCG/2ML IJ SOLN: 100.0000 ug | Freq: Once | INTRAMUSCULAR | Status: DC | PRN

## 2017-06-06 MED ORDER — HEPARIN SODIUM (PORCINE) 5000 UNIT/ML IJ SOLN
5000.0000 [IU] | Freq: Three times a day (TID) | INTRAMUSCULAR | Status: DC
  Administered 2017-06-06 – 2017-06-11 (×15): 5000 [IU] via SUBCUTANEOUS
  Filled 2017-06-06 (×16): qty 1

## 2017-06-06 MED ORDER — ALBUTEROL (5 MG/ML) CONTINUOUS INHALATION SOLN
15.0000 mg/h | INHALATION_SOLUTION | Freq: Once | RESPIRATORY_TRACT | Status: AC
  Administered 2017-06-06: 15 mg/h via RESPIRATORY_TRACT
  Filled 2017-06-06: qty 20

## 2017-06-06 MED ORDER — BUDESONIDE 0.5 MG/2ML IN SUSP
0.5000 mg | Freq: Two times a day (BID) | RESPIRATORY_TRACT | Status: DC
  Administered 2017-06-06 – 2017-06-11 (×11): 0.5 mg via RESPIRATORY_TRACT
  Filled 2017-06-06 (×11): qty 2

## 2017-06-06 MED ORDER — PROPOFOL 1000 MG/100ML IV EMUL
0.0000 ug/kg/min | INTRAVENOUS | Status: DC
  Administered 2017-06-06: 40 ug/kg/min via INTRAVENOUS
  Administered 2017-06-06: 5 ug/kg/min via INTRAVENOUS
  Administered 2017-06-07: 50 ug/kg/min via INTRAVENOUS
  Administered 2017-06-07: 40 ug/kg/min via INTRAVENOUS
  Administered 2017-06-07: 50 ug/kg/min via INTRAVENOUS
  Administered 2017-06-07: 40 ug/kg/min via INTRAVENOUS
  Administered 2017-06-07: 50 ug/kg/min via INTRAVENOUS
  Administered 2017-06-08 (×2): 40 ug/kg/min via INTRAVENOUS
  Filled 2017-06-06 (×9): qty 100

## 2017-06-06 MED ORDER — FENTANYL CITRATE (PF) 100 MCG/2ML IJ SOLN: 50.0000 ug | Freq: Once | INTRAMUSCULAR | Status: DC

## 2017-06-06 MED ORDER — MIDAZOLAM HCL 2 MG/2ML IJ SOLN
2.0000 mg | Freq: Once | INTRAMUSCULAR | Status: DC
  Filled 2017-06-06: qty 2

## 2017-06-06 MED ORDER — MIDAZOLAM HCL 2 MG/2ML IJ SOLN
2.0000 mg | INTRAMUSCULAR | Status: DC | PRN
  Administered 2017-06-08: 2 mg via INTRAVENOUS
  Filled 2017-06-06: qty 2

## 2017-06-06 MED ORDER — LACTATED RINGERS IV BOLUS (SEPSIS)
2000.0000 mL | Freq: Once | INTRAVENOUS | Status: AC
  Administered 2017-06-06: 2000 mL via INTRAVENOUS

## 2017-06-06 MED ORDER — FENTANYL BOLUS VIA INFUSION
50.0000 ug | INTRAVENOUS | Status: DC | PRN
  Filled 2017-06-06: qty 50

## 2017-06-06 MED ORDER — ROCURONIUM BROMIDE 50 MG/5ML IV SOLN: 50.0000 mg | Freq: Once | INTRAVENOUS | Status: AC

## 2017-06-06 MED ORDER — FAMOTIDINE 40 MG/5ML PO SUSR
20.0000 mg | Freq: Two times a day (BID) | ORAL | Status: DC
  Administered 2017-06-06 – 2017-06-08 (×6): 20 mg
  Filled 2017-06-06 (×7): qty 2.5

## 2017-06-06 MED ORDER — FENTANYL CITRATE (PF) 100 MCG/2ML IJ SOLN
INTRAMUSCULAR | Status: AC
  Filled 2017-06-06: qty 2

## 2017-06-06 MED ORDER — CHLORHEXIDINE GLUCONATE 0.12% ORAL RINSE (MEDLINE KIT)
15.0000 mL | Freq: Two times a day (BID) | OROMUCOSAL | Status: DC
  Administered 2017-06-06 – 2017-06-09 (×7): 15 mL via OROMUCOSAL

## 2017-06-06 MED ORDER — FENTANYL CITRATE (PF) 100 MCG/2ML IJ SOLN: 100.0000 ug | Freq: Once | INTRAMUSCULAR | Status: DC

## 2017-06-06 MED ORDER — SODIUM CHLORIDE 0.9% FLUSH
10.0000 mL | Freq: Two times a day (BID) | INTRAVENOUS | Status: DC
  Administered 2017-06-06 – 2017-06-11 (×8): 10 mL

## 2017-06-06 MED ORDER — ROCURONIUM BROMIDE 50 MG/5ML IV SOLN
50.0000 mg | Freq: Once | INTRAVENOUS | Status: AC
  Administered 2017-06-06: 50 mg via INTRAVENOUS

## 2017-06-06 MED ORDER — DOCUSATE SODIUM 50 MG/5ML PO LIQD
100.0000 mg | Freq: Two times a day (BID) | ORAL | Status: DC | PRN
  Administered 2017-06-09: 100 mg
  Filled 2017-06-06 (×2): qty 10

## 2017-06-06 MED ORDER — SODIUM CHLORIDE 0.9 % IV SOLN: 250.0000 mL | INTRAVENOUS | Status: DC | PRN

## 2017-06-06 MED ORDER — SODIUM CHLORIDE 0.9 % IV SOLN
0.0000 mg/h | INTRAVENOUS | Status: DC
  Administered 2017-06-06: 2 mg/h via INTRAVENOUS
  Filled 2017-06-06 (×2): qty 10

## 2017-06-06 MED ORDER — VECURONIUM BROMIDE 10 MG IV SOLR
INTRAVENOUS | Status: AC
  Filled 2017-06-06: qty 20

## 2017-06-06 MED ORDER — CHLORHEXIDINE GLUCONATE CLOTH 2 % EX PADS
6.0000 | MEDICATED_PAD | Freq: Every day | CUTANEOUS | Status: DC
  Administered 2017-06-09 (×2): 6 via TOPICAL

## 2017-06-06 MED ORDER — PROPOFOL 1000 MG/100ML IV EMUL
0.0000 ug/kg/min | INTRAVENOUS | Status: DC
  Administered 2017-06-06: 5 ug/kg/min via INTRAVENOUS
  Filled 2017-06-06: qty 100

## 2017-06-06 MED ORDER — MIDAZOLAM HCL 2 MG/2ML IJ SOLN
2.0000 mg | Freq: Once | INTRAMUSCULAR | Status: AC | PRN
  Administered 2017-06-08: 2 mg via INTRAVENOUS

## 2017-06-06 MED ORDER — ACETAMINOPHEN 325 MG PO TABS
650.0000 mg | ORAL_TABLET | ORAL | Status: DC | PRN
  Administered 2017-06-09 – 2017-06-10 (×2): 650 mg via ORAL
  Filled 2017-06-06 (×2): qty 2

## 2017-06-06 MED ORDER — METHYLPREDNISOLONE SODIUM SUCC 125 MG IJ SOLR
60.0000 mg | Freq: Four times a day (QID) | INTRAMUSCULAR | Status: DC
  Administered 2017-06-06 – 2017-06-08 (×8): 60 mg via INTRAVENOUS
  Filled 2017-06-06 (×8): qty 2

## 2017-06-06 MED ORDER — SODIUM CHLORIDE 0.9% FLUSH: 10.0000 mL | INTRAVENOUS | Status: DC | PRN

## 2017-06-06 MED ORDER — SODIUM CHLORIDE 0.9 % IV SOLN
2.0000 mg/h | INTRAVENOUS | Status: DC
  Administered 2017-06-07: 10 mg/h via INTRAVENOUS
  Administered 2017-06-07: 8 mg/h via INTRAVENOUS
  Administered 2017-06-08: 2 mg/h via INTRAVENOUS
  Filled 2017-06-06 (×3): qty 20

## 2017-06-06 MED ORDER — VECURONIUM BOLUS VIA INFUSION
0.0800 mg/kg | Freq: Once | INTRAVENOUS | Status: DC
  Filled 2017-06-06: qty 7

## 2017-06-06 MED ORDER — MIDAZOLAM BOLUS VIA INFUSION
2.0000 mg | INTRAVENOUS | Status: DC | PRN
  Administered 2017-06-07 – 2017-06-08 (×4): 2 mg via INTRAVENOUS
  Filled 2017-06-06: qty 2

## 2017-06-06 MED ORDER — SODIUM CHLORIDE 0.9 % IV SOLN
25.0000 ug/h | INTRAVENOUS | Status: DC
  Administered 2017-06-06: 50 ug/h via INTRAVENOUS
  Filled 2017-06-06 (×2): qty 50

## 2017-06-06 MED ORDER — LORAZEPAM 2 MG/ML IJ SOLN
INTRAMUSCULAR | Status: AC
  Filled 2017-06-06: qty 1

## 2017-06-06 MED ORDER — ONDANSETRON HCL 4 MG/2ML IJ SOLN: 4.0000 mg | Freq: Four times a day (QID) | INTRAMUSCULAR | Status: DC | PRN

## 2017-06-06 MED ORDER — SODIUM CHLORIDE 0.9 % IV SOLN
Freq: Once | INTRAVENOUS | Status: AC
  Administered 2017-06-06: 14:00:00 via INTRAVENOUS

## 2017-06-06 MED ORDER — MAGNESIUM SULFATE 2 GM/50ML IV SOLN
2.0000 g | Freq: Once | INTRAVENOUS | Status: AC
  Administered 2017-06-06: 2 g via INTRAVENOUS
  Filled 2017-06-06: qty 50

## 2017-06-06 MED ORDER — FENTANYL CITRATE (PF) 100 MCG/2ML IJ SOLN
100.0000 ug | Freq: Once | INTRAMUSCULAR | Status: AC
  Administered 2017-06-06: 100 ug via INTRAVENOUS

## 2017-06-06 MED ORDER — METHYLPREDNISOLONE SODIUM SUCC 125 MG IJ SOLR: 125.0000 mg | Freq: Once | INTRAMUSCULAR | Status: DC

## 2017-06-06 MED ORDER — MIDAZOLAM HCL 2 MG/2ML IJ SOLN: 2.0000 mg | Freq: Once | INTRAMUSCULAR | Status: AC

## 2017-06-06 MED ORDER — FENTANYL 2500MCG IN NS 250ML (10MCG/ML) PREMIX INFUSION
100.0000 ug/h | INTRAVENOUS | Status: DC
  Administered 2017-06-07: 100 ug/h via INTRAVENOUS
  Filled 2017-06-06: qty 250

## 2017-06-06 MED ORDER — SODIUM CHLORIDE 0.9 % IV SOLN
2.0000 mg/h | INTRAVENOUS | Status: DC
  Administered 2017-06-06 (×2): 10 mg/h via INTRAVENOUS
  Filled 2017-06-06 (×2): qty 10

## 2017-06-06 MED ORDER — SODIUM CHLORIDE 0.9 % IV SOLN
INTRAVENOUS | Status: DC
  Administered 2017-06-06 – 2017-06-09 (×6): via INTRAVENOUS

## 2017-06-06 MED ORDER — ORAL CARE MOUTH RINSE
15.0000 mL | OROMUCOSAL | Status: DC
  Administered 2017-06-06 – 2017-06-09 (×30): 15 mL via OROMUCOSAL

## 2017-06-06 MED ORDER — ALBUTEROL SULFATE (2.5 MG/3ML) 0.083% IN NEBU
2.5000 mg | INHALATION_SOLUTION | RESPIRATORY_TRACT | Status: DC | PRN
  Administered 2017-06-06: 2.5 mg via RESPIRATORY_TRACT
  Filled 2017-06-06: qty 3

## 2017-06-06 MED ORDER — KETAMINE HCL 10 MG/ML IJ SOLN: 1.0000 mg/kg | Freq: Once | INTRAMUSCULAR | Status: DC | PRN

## 2017-06-06 MED ORDER — SODIUM BICARBONATE 8.4 % IV SOLN
100.0000 meq | Freq: Once | INTRAVENOUS | Status: AC
  Administered 2017-06-06: 100 meq via INTRAVENOUS
  Filled 2017-06-06: qty 100

## 2017-06-06 MED ORDER — VECURONIUM BROMIDE 10 MG IV SOLR
0.8000 ug/kg/min | INTRAVENOUS | Status: DC
  Filled 2017-06-06: qty 100

## 2017-06-06 MED ORDER — CHLORHEXIDINE GLUCONATE 0.12% ORAL RINSE (MEDLINE KIT): 15.0000 mL | Freq: Two times a day (BID) | OROMUCOSAL | Status: DC

## 2017-06-06 MED ORDER — FENTANYL 2500MCG IN NS 250ML (10MCG/ML) PREMIX INFUSION
25.0000 ug/h | INTRAVENOUS | Status: DC
  Administered 2017-06-06: 25 ug/h via INTRAVENOUS
  Filled 2017-06-06: qty 250

## 2017-06-06 MED ORDER — IPRATROPIUM-ALBUTEROL 0.5-2.5 (3) MG/3ML IN SOLN
3.0000 mL | RESPIRATORY_TRACT | Status: DC
  Administered 2017-06-06 – 2017-06-08 (×13): 3 mL via RESPIRATORY_TRACT
  Filled 2017-06-06 (×12): qty 3

## 2017-06-06 MED ORDER — IPRATROPIUM BROMIDE 0.02 % IN SOLN
0.5000 mg | Freq: Once | RESPIRATORY_TRACT | Status: AC
Start: 2017-06-06 — End: 2017-06-06
  Administered 2017-06-06: 0.5 mg via RESPIRATORY_TRACT
  Filled 2017-06-06: qty 2.5

## 2017-06-06 MED ORDER — NOREPINEPHRINE BITARTRATE 1 MG/ML IV SOLN
0.0000 ug/min | INTRAVENOUS | Status: DC
  Administered 2017-06-06: 2 ug/min via INTRAVENOUS
  Administered 2017-06-07: 7 ug/min via INTRAVENOUS
  Administered 2017-06-07: 10 ug/min via INTRAVENOUS
  Filled 2017-06-06 (×3): qty 4

## 2017-06-06 MED ORDER — MIDAZOLAM BOLUS VIA INFUSION
1.0000 mg | INTRAVENOUS | Status: DC | PRN
  Filled 2017-06-06: qty 2

## 2017-06-06 MED ORDER — LORAZEPAM 2 MG/ML IJ SOLN
2.0000 mg | Freq: Once | INTRAMUSCULAR | Status: AC
  Administered 2017-06-06: 2 mg via INTRAVENOUS

## 2017-06-06 MED ORDER — ORAL CARE MOUTH RINSE: 15.0000 mL | Freq: Four times a day (QID) | OROMUCOSAL | Status: DC

## 2017-06-06 MED ORDER — PROPOFOL 1000 MG/100ML IV EMUL
INTRAVENOUS | Status: AC
  Filled 2017-06-06: qty 100

## 2017-06-06 MED ORDER — ALBUTEROL (5 MG/ML) CONTINUOUS INHALATION SOLN: 10.0000 mg/h | INHALATION_SOLUTION | Freq: Once | RESPIRATORY_TRACT | Status: DC

## 2017-06-06 MED ORDER — MIDAZOLAM HCL 2 MG/2ML IJ SOLN
2.0000 mg | Freq: Once | INTRAMUSCULAR | Status: AC
  Administered 2017-06-06: 2 mg via INTRAVENOUS

## 2017-06-06 NOTE — ED Notes (Signed)
Bed: WA03 Expected date:  Expected time:  Means of arrival:  Comments: 

## 2017-06-06 NOTE — H&P (Signed)
PULMONARY / CRITICAL CARE MEDICINE   Name: Aaron [name] MRN: [id number] DOB: [birth year]    ADMISSION DATE:  06/06/2017 CONSULTATION DATE: 06/06/17  REFERRING MD:  Dr Rhunette Croft (ED)  CHIEF COMPLAINT: Asthma exacerbation; Respiratory failure  HISTORY OF PRESENT ILLNESS:   46yoM with hx of Asthma, presented early this AM to the [facility] ER c/o SOB x 2 days. Denied F/C, CP. Admitted to chronic cough productive of clear sputum but no changes in sputum quality or quantity. In the ER he was given nebulizer treatments and magnesium with no improvement. He was found to be tripoding and became agitated when BIPAP was attempted. Therefore patient was intubated.   At time of my exam patient is intubated but under-sedated, sitting up on stretcher, lifting legs in air, coughing. His fiancee is present in the room who assists with the history. She says he has been stressed out lately as his father just died. She reports he has not run out of his home asthma medications though. She also reports no illicit drug use. Reports he has not had F/C, Cp, Muscle aches, or changes changes in his chronic cough or sputum.   PAST MEDICAL HISTORY :  He  has a past medical history of Asthma.  PAST SURGICAL HISTORY: He  has a past surgical history that includes Abdominal surgery and Hernia repair.  No Known Allergies  No current facility-administered medications on file prior to encounter.    Current Outpatient Medications on File Prior to Encounter  Medication Sig  . albuterol (PROVENTIL HFA;VENTOLIN HFA) 108 (90 Base) MCG/ACT inhaler Inhale 1-2 puffs into the lungs every 6 (six) hours as needed for wheezing or shortness of breath.  Marland Kitchen albuterol (PROVENTIL) (5 MG/ML) 0.5% nebulizer solution Take 0.5 mLs (2.5 mg total) by nebulization every 6 (six) hours as needed for wheezing or shortness of breath.  . fluticasone (FLOVENT HFA) 110 MCG/ACT inhaler Inhale 2 puffs into the lungs 2 (two) times daily.  Marland Kitchen albuterol (PROVENTIL  HFA;VENTOLIN HFA) 108 (90 Base) MCG/ACT inhaler Inhale 1-2 puffs into the lungs every 4 (four) hours as needed for wheezing or shortness of breath. (Patient not taking: Reported on 03/15/2017)  . albuterol (PROVENTIL) (2.5 MG/3ML) 0.083% nebulizer solution Take 3 mLs (2.5 mg total) by nebulization 2 (two) times daily. (Patient not taking: Reported on 06/06/2017)  . fluticasone (FLOVENT HFA) 110 MCG/ACT inhaler Inhale 2 puffs into the lungs 2 (two) times daily. (Patient not taking: Reported on 02/25/2016)  . loratadine (CLARITIN) 10 MG tablet Take 1 tablet (10 mg total) by mouth daily as needed for allergies. Use Claritin, Zyrtec, or any similar over the counter anti-histamine daily (store brand is fine too) during changes in seasons when you normally experience difficulty with wheezing. (Patient not taking: Reported on 06/06/2017)  . montelukast (SINGULAIR) 10 MG tablet Take 1 tablet (10 mg total) by mouth at bedtime. (Patient not taking: Reported on 06/06/2017)  . predniSONE (DELTASONE) 10 MG tablet Take 4 tablets a day on 10/28 and 10/29 THEN take 2 tablets a day on 10/30 and 10/31 THEN take 1 tablet a day on 11/1 and 11/2 THEN STOP (Patient not taking: Reported on 03/15/2017)  . predniSONE (DELTASONE) 50 MG tablet Take 1 pill daily (Patient not taking: Reported on 03/15/2017)   FAMILY HISTORY:  His indicated that his mother is deceased. He indicated that his father is alive.  SOCIAL HISTORY: He  reports that he has been smoking cigarettes.  He has been smoking about 0.50 packs per day.  he has never used smokeless tobacco. He reports that he drinks alcohol. He reports that he uses drugs. Drug: Marijuana.  REVIEW OF SYSTEMS:   Review of Systems  Unable to perform ROS: Critical illness   SUBJECTIVE:  Intubated   VITAL SIGNS: BP (!) 118/53   Pulse (!) 110   Temp (!) 95.5 F (35.3 C)   Resp 17   Wt 77.1 kg (170 lb)   SpO2 100%   BMI 24.39 kg/m   VENTILATOR SETTINGS: Vent Mode: PRVC FiO2  (%):  [100 %] 100 % Set Rate:  [16 bmp] 16 bmp Vt Set:  [580 mL] 580 mL PEEP:  [5 cmH20] 5 cmH20 Plateau Pressure:  [28 cmH20] 28 cmH20  INTAKE / OUTPUT: No intake/output data recorded.  PHYSICAL EXAMINATION: General: WDWN Adult male, intubated, critically ill Neuro:  RASS +4, spontaneously moving arms and legs and attempting to sit up in bed, lifting legs off bed, not obeying commands. PERRL.  HEENT: OP clear, MM moist, Copious clear oral secretions, Orally intubated  Cardiovascular: Tachycardic with a regular rhythm, no m/r/g Lungs: Coarse breath sounds b/l with some rhonchi. Equal and symmetric chest rise.  Abdomen: Soft NTND, BS absent. Large well-healed abdominal scar on lower abdomen of unclear etiology Musculoskeletal: no LE edema  Skin: no rashes   LABS:  BMET Recent Labs  Lab 06/06/17 0333  NA 142  K 3.9  CL 103  BUN 14  CREATININE 1.00  GLUCOSE 104*   Electrolytes No results for input(s): CALCIUM, MG, PHOS in the last 168 hours.  CBC Recent Labs  Lab 06/06/17 0310 06/06/17 0333  WBC 7.6  --   HGB 14.3 15.0  HCT 42.2 44.0  PLT 208  --    Coag's No results for input(s): APTT, INR in the last 168 hours.  Sepsis Markers No results for input(s): LATICACIDVEN, PROCALCITON, O2SATVEN in the last 168 hours.  ABG No results for input(s): PHART, PCO2ART, PO2ART in the last 168 hours.  Liver Enzymes No results for input(s): AST, ALT, ALKPHOS, BILITOT, ALBUMIN in the last 168 hours.  Cardiac Enzymes No results for input(s): TROPONINI, PROBNP in the last 168 hours.  Glucose No results for input(s): GLUCAP in the last 168 hours.  Imaging Dg Chest 2 View  Result Date: 06/05/2017 CLINICAL DATA:  Asthma attack.  Shortness of breath. EXAM: CHEST  2 VIEW COMPARISON:  February 23, 2017 FINDINGS: The heart size and mediastinal contours are within normal limits. Both lungs are clear. The visualized skeletal structures are unremarkable. IMPRESSION: No active  cardiopulmonary disease. Electronically Signed   By: Gerome Sam III M.D   On: 06/05/2017 23:02   Dg Abdomen 1 View  Result Date: 06/06/2017 CLINICAL DATA:  Orogastric tube placement. EXAM: ABDOMEN - 1 VIEW COMPARISON:  None. FINDINGS: The patient's enteric tube is noted ending overlying the body of the stomach, with the side port about the fundus of the stomach. The visualized bowel gas pattern is grossly unremarkable, with a small amount of stool noted in the colon. Postoperative change is noted along the midline abdominal wall. No acute osseous abnormalities are seen. The visualized lung bases are clear. IMPRESSION: Enteric tube noted ending overlying the body of the stomach. Electronically Signed   By: Roanna Raider M.D.   On: 06/06/2017 05:17   Dg Chest Port 1 View  Result Date: 06/06/2017 CLINICAL DATA:  Endotracheal tube placement. EXAM: PORTABLE CHEST 1 VIEW COMPARISON:  Chest radiograph performed 06/05/2017 FINDINGS: The patient's endotracheal tube is  seen ending 3 cm above the carina. An enteric tube is noted extending below the diaphragm. The lungs are well-aerated and clear. There is no evidence of focal opacification, pleural effusion or pneumothorax. The cardiomediastinal silhouette is within normal limits. No acute osseous abnormalities are seen. IMPRESSION: 1. Endotracheal tube seen ending 3 cm above the carina. 2. Enteric tube seen extending below the diaphragm. 3. No acute cardiopulmonary process seen. Electronically Signed   By: Roanna RaiderJeffery  Chang M.D.   On: 06/06/2017 05:16   SIGNIFICANT EVENTS: 2/5: presented to ER in asthma exacerbation >> failed BIPAP >> intubated   LINES/TUBES: 20G and 18G PIVs ETT 2/5 >> Foley catheter 2/5 >> OG tube 2/5 >>  DISCUSSION: 46yoM with hx of Asthma, [facility]/o SOB x 2 days, failed BIPAP and required intubation.   ASSESSMENT / PLAN:  PULMONARY 1. Acute hypoxic and hypercapneic respiratory failure; Asthma  exacerbation: - continue mechanical ventilation - ABG post intubation showed acute respiratory acidosis; increased RR from 16 to 20. Repeat ABG in 2 hours.  - undersedated on propofol gtt but BP drops when propofol gtt increased; will start fentanyl gtt instead and wean down on propofol gtt  - start solumedrol, duonebs, pulmicort, and brovana - CXR on my review shows clear lung fields with no infiltrates, effusions, or edema; ETT in good position.  - clinically on my exam he sounded fluid overloaded on lung auscultation but no pedal edema and no visualized pulmonary edema on CXR; [facility] and check BNP - check flu PCR and UDS - repeat CXR/ABG daily  CARDIOVASCULAR 1. Hypotension: - iatrogenic from the propofol; see plan above  RENAL No active issues   GASTROINTESTINAL No active issues; NPO; GI prophylaxis  HEMATOLOGIC No active issues; DVT prophylaxis  INFECTIOUS No active issues   ENDOCRINE No active issues   NEUROLOGIC No active issues    FAMILY  - Updated patient's fiancee who is present in ER exam room - Inter-disciplinary family meet or Palliative Care meeting due by:  06/13/17  60 minutes critical care time  Milana ObeyKathleen Hammonds, MD  Pulmonary and Critical Care Medicine [facility] Pager: [phone]  06/06/2017, 6:33 AM

## 2017-06-06 NOTE — ED Notes (Signed)
 20mg  Etomidate given 0428 100mg  Succinylcholine given 0429 7.3755mm tube placed @ 26 cm at lips 0430

## 2017-06-06 NOTE — ED Provider Notes (Signed)
[facility] DEPT Provider Note   CSN: [id number] Arrival date & time: 06/05/17  2159     History   Chief Complaint Chief Complaint  Patient presents with  . Asthma    HPI Aaron [name] is a 47 y.o. male.  HPI  47 year old comes in with chief complaint of shortness of breath.  Patient has history of asthma.  He states that he started having shortness of breath about 2 days ago, and he attributes that to change in weather.  Patient has taken multiple breathing treatments, with minimal relief.  Patient has no history of intubation or admission within the last 1 year for asthma.  Review of system is negative for any flulike illness, fevers, productive cough.  Patient has no chest pain or shortness of breath.   Past Medical History:  Diagnosis Date  . Asthma     Patient Active Problem List   Diagnosis Date Noted  . Acute on chronic respiratory failure with hypoxia ([place]) 02/23/2017    Past Surgical History:  Procedure Laterality Date  . ABDOMINAL SURGERY    . HERNIA REPAIR         Home Medications    Prior to Admission medications   Medication Sig Start Date End Date Taking? Authorizing Provider  albuterol (PROVENTIL HFA;VENTOLIN HFA) 108 (90 Base) MCG/ACT inhaler Inhale 1-2 puffs into the lungs every 6 (six) hours as needed for wheezing or shortness of breath. 02/01/17  Yes Carmon Sails J, PA-C  albuterol (PROVENTIL) (5 MG/ML) 0.5% nebulizer solution Take 0.5 mLs (2.5 mg total) by nebulization every 6 (six) hours as needed for wheezing or shortness of breath. 02/01/17  Yes Carmon Sails J, PA-C  fluticasone (FLOVENT HFA) 110 MCG/ACT inhaler Inhale 2 puffs into the lungs 2 (two) times daily. 02/25/17 02/25/18 Yes Cherene Altes, MD  albuterol (PROVENTIL HFA;VENTOLIN HFA) 108 (90 Base) MCG/ACT inhaler Inhale 1-2 puffs into the lungs every 4 (four) hours as needed for wheezing or shortness of breath. Patient not taking: Reported on  03/15/2017 02/25/17   Cherene Altes, MD  albuterol (PROVENTIL) (2.5 MG/3ML) 0.083% nebulizer solution Take 3 mLs (2.5 mg total) by nebulization 2 (two) times daily. Patient not taking: Reported on 06/06/2017 04/21/17   Scot Jun, [name]  fluticasone (FLOVENT HFA) 110 MCG/ACT inhaler Inhale 2 puffs into the lungs 2 (two) times daily. Patient not taking: Reported on 02/25/2016 03/24/12   Palumbo, April, MD  loratadine (CLARITIN) 10 MG tablet Take 1 tablet (10 mg total) by mouth daily as needed for allergies. Use Claritin, Zyrtec, or any similar over the counter anti-histamine daily (store brand is fine too) during changes in seasons when you normally experience difficulty with wheezing. Patient not taking: Reported on 06/06/2017 02/25/17 02/25/18  Cherene Altes, MD  montelukast (SINGULAIR) 10 MG tablet Take 1 tablet (10 mg total) by mouth at bedtime. Patient not taking: Reported on 06/06/2017 02/01/17 03/03/17  Kinnie Feil, PA-C  predniSONE (DELTASONE) 10 MG tablet Take 4 tablets a day on 10/28 and 10/29 THEN take 2 tablets a day on 10/30 and 10/31 THEN take 1 tablet a day on 11/1 and 11/2 THEN STOP Patient not taking: Reported on 03/15/2017 02/25/17   Cherene Altes, MD  predniSONE (DELTASONE) 50 MG tablet Take 1 pill daily Patient not taking: Reported on 03/15/2017 02/01/17   Kinnie Feil, PA-C    Family History Family History  Problem Relation Age of Onset  . Diabetes Mother   .  Hypertension Father     Social History Social History   Tobacco Use  . Smoking status: Current Every Day Smoker    Packs/day: 0.50    Types: Cigarettes  . Smokeless tobacco: Never Used  Substance Use Topics  . Alcohol use: Yes    Comment: occasion  . Drug use: Yes    Types: Marijuana     Allergies   Patient has no known allergies.   Review of Systems Review of Systems  Constitutional: Positive for activity change.  Respiratory: Positive for shortness of breath.     Cardiovascular: Negative for chest pain.  Allergic/Immunologic: Negative for immunocompromised state.  Hematological: Does not bruise/bleed easily.  All other systems reviewed and are negative.    Physical Exam Updated Vital Signs BP 91/63   Pulse (!) 107   Temp 97.9 F (36.6 C) (Oral)   Resp 16   Wt 77.1 kg (170 lb)   SpO2 100%   BMI 24.39 kg/m   Physical Exam  Constitutional: He is oriented to person, place, and time. He appears well-developed.  HENT:  Head: Atraumatic.  Eyes: EOM are normal.  Neck: Neck supple.  Cardiovascular:  Tachycardia  Pulmonary/Chest: No stridor. He is in respiratory distress. He has wheezes.  Poor aeration  Abdominal: Soft. A hernia is present.  Musculoskeletal: He exhibits no edema or tenderness.  Neurological: He is alert and oriented to person, place, and time.  Skin: Skin is warm.  Nursing note and vitals reviewed.    ED Treatments / Results  Labs (all labs ordered are listed, but only abnormal results are displayed) Labs Reviewed  I-STAT CHEM 8, ED - Abnormal; Notable for the following components:      Result Value   Glucose, Bld 104 (*)    All other components within normal limits  CBC  INFLUENZA PANEL BY PCR (TYPE A & B)  TRIGLYCERIDES    EKG  EKG Interpretation  Date/Time:  Tuesday June 06 2017 04:36:03 EST Ventricular Rate:  94 PR Interval:    QRS Duration: 103 QT Interval:  343 QTC Calculation: 429 R Axis:   80 Text Interpretation:  Sinus rhythm Anteroseptal infarct, old No acute changes Confirmed by Varney Biles ([id number]) on 06/06/2017 4:37:53 AM       Radiology Dg Chest 2 View  Result Date: 06/05/2017 CLINICAL DATA:  Asthma attack.  Shortness of breath. EXAM: CHEST  2 VIEW COMPARISON:  February 23, 2017 FINDINGS: The heart size and mediastinal contours are within normal limits. Both lungs are clear. The visualized skeletal structures are unremarkable. IMPRESSION: No active cardiopulmonary disease.  Electronically Signed   By: Dorise Bullion III M.D   On: 06/05/2017 23:02   Dg Abdomen 1 View  Result Date: 06/06/2017 CLINICAL DATA:  Orogastric tube placement. EXAM: ABDOMEN - 1 VIEW COMPARISON:  None. FINDINGS: The patient's enteric tube is noted ending overlying the body of the stomach, with the side port about the fundus of the stomach. The visualized bowel gas pattern is grossly unremarkable, with a small amount of stool noted in the colon. Postoperative change is noted along the midline abdominal wall. No acute osseous abnormalities are seen. The visualized lung bases are clear. IMPRESSION: Enteric tube noted ending overlying the body of the stomach. Electronically Signed   By: Garald Balding M.D.   On: 06/06/2017 05:17   Dg Chest Port 1 View  Result Date: 06/06/2017 CLINICAL DATA:  Endotracheal tube placement. EXAM: PORTABLE CHEST 1 VIEW COMPARISON:  Chest radiograph performed 06/05/2017  FINDINGS: The patient's endotracheal tube is seen ending 3 cm above the carina. An enteric tube is noted extending below the diaphragm. The lungs are well-aerated and clear. There is no evidence of focal opacification, pleural effusion or pneumothorax. The cardiomediastinal silhouette is within normal limits. No acute osseous abnormalities are seen. IMPRESSION: 1. Endotracheal tube seen ending 3 cm above the carina. 2. Enteric tube seen extending below the diaphragm. 3. No acute cardiopulmonary process seen. Electronically Signed   By: Garald Balding M.D.   On: 06/06/2017 05:16    Procedures Procedure Name: Intubation Date/Time: 06/06/2017 5:28 AM Performed by: Varney Biles, MD Pre-anesthesia Checklist: Patient identified, Emergency Drugs available, Suction available and Patient being monitored Oxygen Delivery Method: Non-rebreather mask Induction Type: Rapid sequence Laryngoscope Size: Mac and 3 Grade View: Grade II Tube size: 7.5 mm Number of attempts: 1 Placement Confirmation: ETT inserted through  vocal cords under direct vision,  Positive ETCO2,  CO2 detector and Breath sounds checked- equal and bilateral Secured at: 23 cm Tube secured with: ETT holder      (including critical care time)   CRITICAL CARE Performed by: Laine Fonner   Total critical care time: 57 minutes  Critical care time was exclusive of separately billable procedures and treating other patients.  Critical care was necessary to treat or prevent imminent or life-threatening deterioration.  Critical care was time spent personally by me on the following activities: development of treatment plan with patient and/or surrogate as well as nursing, discussions with consultants, evaluation of patient's response to treatment, examination of patient, obtaining history from patient or surrogate, ordering and performing treatments and interventions, ordering and review of laboratory studies, ordering and review of radiographic studies, pulse oximetry and re-evaluation of patient's condition.     Medications Ordered in ED Medications  midazolam (VERSED) injection 2 mg (not administered)  propofol (DIPRIVAN) 1000 MG/100ML infusion (30 mcg/kg/min  77.1 kg Intravenous Rate/Dose Change 06/06/17 0527)  lactated ringers bolus 2,000 mL (2,000 mLs Intravenous New Bag/Given 06/06/17 0522)  propofol (DIPRIVAN) 1000 MG/100ML infusion (not administered)  ketamine (KETALAR) injection 77 mg (not administered)  albuterol (PROVENTIL) (2.5 MG/3ML) 0.083% nebulizer solution 5 mg (5 mg Nebulization Given 06/05/17 2220)  albuterol (PROVENTIL,VENTOLIN) solution continuous neb (15 mg/hr Nebulization Given 06/06/17 0335)  magnesium sulfate IVPB 2 g 50 mL (0 g Intravenous Stopped 06/06/17 0409)  ipratropium (ATROVENT) nebulizer solution 0.5 mg (0.5 mg Nebulization Given 06/06/17 0513)  fentaNYL (SUBLIMAZE) injection 100 mcg (100 mcg Intravenous Given 06/06/17 0514)  LORazepam (ATIVAN) injection 2 mg (2 mg Intravenous Given 06/06/17 0514)  albuterol  (PROVENTIL,VENTOLIN) solution continuous neb (15 mg/hr Nebulization Given 06/06/17 0513)     Initial Impression / Assessment and Plan / ED Course  I have reviewed the triage vital signs and the nursing notes.  Pertinent labs & imaging results that were available during my care of the patient were reviewed by me and considered in my medical decision making (see chart for details).  Clinical Course as of Jun 06 526  Tue Jun 06, 2017  0500 Went to reassess patient, and he was noted to be in tripod position, diaphoretic, and complaining that he was unable to breathe. We called respiratory therapy to see if he can place patient on BiPAP.  And route to the resuscitation bay, patient became more agitated, and it was getting difficult to direct him, therefore we decided to intubate.  [AN]  0527 reviewed DG Chest [facility] [AN]    Clinical Course User Index [  AN] Varney Biles, MD    47 year old male with history of asthma comes in with chief complaint of shortness of breath.  Patient is noted to have significant tightness and poor aeration of the chest.  Mild wheezing appreciated.  Patient does not have any concerning history of asthma, specifically no history of intubations or admissions within the last 1 year.  Patient is not having underlying flulike illness or viral syndrome.  Chest x-ray is clear.  No risk factors for PE.  Given the respiratory distress with poor lung exam, we will start patient on nebulizer treatment and give him a dose of IV magnesium.  We will reassess.  Final Clinical Impressions(s) / ED Diagnoses   Final diagnoses:  Severe persistent asthma with exacerbation  Acute respiratory failure with hypoxia [facility])    ED Discharge Orders    None       Varney Biles, MD 06/06/17 [phone]

## 2017-06-06 NOTE — Care Management Note (Signed)
Case Management Note  Patient Details  Name: Aaron [name] MRN: [id number] Date of Birth: January 11, 1971  Subjective/Objective:  Pt admitted on 06/06/17 with asthma exacerbation.  PTA, pt independent, lives at home with fiance.                  Action/Plan: Pt currently intubated.  Will follow for discharge planning as pt progresses.    Expected Discharge Date:                  Expected Discharge Plan:     In-House Referral:     Discharge planning Services  CM Consult  Post Acute Care Choice:    Choice offered to:     DME Arranged:    DME Agency:     HH Arranged:    HH Agency:     Status of Service:  In process, will continue to follow  If discussed at Long Length of Stay Meetings, dates discussed:    Additional Comments:  Glennon Macmerson, Brandn Mcgath M, RN 06/06/2017, 4:52 PM

## 2017-06-06 NOTE — Plan of Care (Signed)
Pt synchronous with the vent this shift with ABG values with slight improving trend.

## 2017-06-06 NOTE — Progress Notes (Signed)
PULMONARY / CRITICAL CARE MEDICINE   Name: Aaron [name] MRN: [id number] DOB: [birth year]    ADMISSION DATE:  06/06/2017  Critical Care morning note: Patient assessed. Ventilator adjustments made to reduce risk of barotrauma. Switched to [facility], goal is to maintain Pplateau < 30cm, I:E is 1:4, PEEP is 5cm. Auto peep measured at < 5cm. ABG's pending on these settings. Care discussed at the bedside with the nurse and RT.  Patient developed hypotension on propofol and/or fentanyl. Will being an infusion of versed. No prior history of CV disease. CXR with good ETT and OGT placement. No pulmonary opacities. No pneumothorax. No pleural effusions.  No current facility-administered medications on file prior to encounter.    Current Outpatient Medications on File Prior to Encounter  Medication Sig  . albuterol (PROVENTIL HFA;VENTOLIN HFA) 108 (90 Base) MCG/ACT inhaler Inhale 1-2 puffs into the lungs every 6 (six) hours as needed for wheezing or shortness of breath.  Marland Kitchen albuterol (PROVENTIL) (5 MG/ML) 0.5% nebulizer solution Take 0.5 mLs (2.5 mg total) by nebulization every 6 (six) hours as needed for wheezing or shortness of breath.  . fluticasone (FLOVENT HFA) 110 MCG/ACT inhaler Inhale 2 puffs into the lungs 2 (two) times daily.  Marland Kitchen albuterol (PROVENTIL HFA;VENTOLIN HFA) 108 (90 Base) MCG/ACT inhaler Inhale 1-2 puffs into the lungs every 4 (four) hours as needed for wheezing or shortness of breath. (Patient not taking: Reported on 03/15/2017)  . albuterol (PROVENTIL) (2.5 MG/3ML) 0.083% nebulizer solution Take 3 mLs (2.5 mg total) by nebulization 2 (two) times daily. (Patient not taking: Reported on 06/06/2017)  . fluticasone (FLOVENT HFA) 110 MCG/ACT inhaler Inhale 2 puffs into the lungs 2 (two) times daily. (Patient not taking: Reported on 02/25/2016)  . loratadine (CLARITIN) 10 MG tablet Take 1 tablet (10 mg total) by mouth daily as needed for allergies. Use Claritin, Zyrtec, or any similar over the  counter anti-histamine daily (store brand is fine too) during changes in seasons when you normally experience difficulty with wheezing. (Patient not taking: Reported on 06/06/2017)  . montelukast (SINGULAIR) 10 MG tablet Take 1 tablet (10 mg total) by mouth at bedtime. (Patient not taking: Reported on 06/06/2017)  . predniSONE (DELTASONE) 10 MG tablet Take 4 tablets a day on 10/28 and 10/29 THEN take 2 tablets a day on 10/30 and 10/31 THEN take 1 tablet a day on 11/1 and 11/2 THEN STOP (Patient not taking: Reported on 03/15/2017)  . predniSONE (DELTASONE) 50 MG tablet Take 1 pill daily (Patient not taking: Reported on 03/15/2017)    VITAL SIGNS: BP 127/82   Pulse 94   Temp (!) 95.7 F (35.4 C)   Resp 17   Ht 6' (1.829 m)   Wt 79.2 kg (174 lb 9.7 oz)   SpO2 100%   BMI 23.68 kg/m   HEMODYNAMICS:    VENTILATOR SETTINGS: Vent Mode: PRVC FiO2 (%):  [50 %-100 %] 50 % Set Rate:  [16 bmp-20 bmp] 20 bmp Vt Set:  [580 mL-620 mL] 620 mL PEEP:  [5 cmH20] 5 cmH20 Plateau Pressure:  [22 cmH20-28 cmH20] 25 cmH20  INTAKE / OUTPUT: I/O last 3 completed shifts: In: 34 [IV Piggyback:50] Out: -   PHYSICAL EXAMINATION: General:  Sedated. Rass of -1 Neuro:  RASS of -1 HEENT:  ETT/OGT, PERL. No icterus or jvd. Cardiovascular:  Normal s1s2, tachycardic regular rhythm. No murmur, gallop or rub. No pulsus paradoxicus. Lungs:  Bilateral breath sounds. No subq air palpated. Prolonged expiration. Faint rhonchi. No wheezes or crackles. Abdomen:  Soft, non tender. No tympanny. No organomegaly. Musculoskeletal:  Warm to touch. +2 peripheral pulses. Skin:  No cyanosis or pedal edema.  LABS:  BMET Recent Labs  Lab 06/06/17 0333 06/06/17 0644  NA 142 142  K 3.9 3.4*  CL 103 109  CO2  --  24  BUN 14 14  CREATININE 1.00 1.33*  GLUCOSE 104* 149*    Electrolytes Recent Labs  Lab 06/06/17 0644  CALCIUM 8.0*  MG 2.2  PHOS 5.0*    CBC Recent Labs  Lab 06/06/17 0310 06/06/17 0333  06/06/17 0644  WBC 7.6  --  9.5  HGB 14.3 15.0 12.7*  HCT 42.2 44.0 38.8*  PLT 208  --  164    Coag's No results for input(s): APTT, INR in the last 168 hours.  Sepsis Markers No results for input(s): LATICACIDVEN, PROCALCITON, O2SATVEN in the last 168 hours.  ABG Recent Labs  Lab 06/06/17 0617 06/06/17 0729  PHART 7.155* 7.192*  PCO2ART 59.4* 56.6*  PO2ART 296* 138*    Liver Enzymes Recent Labs  Lab 06/06/17 0644  AST 30  ALT 28  ALKPHOS 64  BILITOT 0.4  ALBUMIN 3.9    Cardiac Enzymes Recent Labs  Lab 06/06/17 0644  TROPONINI <0.03    Glucose No results for input(s): GLUCAP in the last 168 hours.  Imaging Dg Chest 2 View  Result Date: 06/05/2017 CLINICAL DATA:  Asthma attack.  Shortness of breath. EXAM: CHEST  2 VIEW COMPARISON:  February 23, 2017 FINDINGS: The heart size and mediastinal contours are within normal limits. Both lungs are clear. The visualized skeletal structures are unremarkable. IMPRESSION: No active cardiopulmonary disease. Electronically Signed   By: Gerome Sam III M.D   On: 06/05/2017 23:02   Dg Abdomen 1 View  Result Date: 06/06/2017 CLINICAL DATA:  Orogastric tube placement. EXAM: ABDOMEN - 1 VIEW COMPARISON:  None. FINDINGS: The patient's enteric tube is noted ending overlying the body of the stomach, with the side port about the fundus of the stomach. The visualized bowel gas pattern is grossly unremarkable, with a small amount of stool noted in the colon. Postoperative change is noted along the midline abdominal wall. No acute osseous abnormalities are seen. The visualized lung bases are clear. IMPRESSION: Enteric tube noted ending overlying the body of the stomach. Electronically Signed   By: Roanna Raider M.D.   On: 06/06/2017 05:17   Dg Chest Port 1 View  Result Date: 06/06/2017 CLINICAL DATA:  Endotracheal tube placement. EXAM: PORTABLE CHEST 1 VIEW COMPARISON:  Chest radiograph performed 06/05/2017 FINDINGS: The patient's  endotracheal tube is seen ending 3 cm above the carina. An enteric tube is noted extending below the diaphragm. The lungs are well-aerated and clear. There is no evidence of focal opacification, pleural effusion or pneumothorax. The cardiomediastinal silhouette is within normal limits. No acute osseous abnormalities are seen. IMPRESSION: 1. Endotracheal tube seen ending 3 cm above the carina. 2. Enteric tube seen extending below the diaphragm. 3. No acute cardiopulmonary process seen. Electronically Signed   By: Roanna Raider M.D.   On: 06/06/2017 05:16    DISCUSSION: 47 y.o. History with a lifelong history of asthma, presents with status asthmaticus. Patient intubated and placed on mechanical ventilation.  ASSESSMENT / PLAN:  PULMONARY A: 1. Status Asthmaticus 2. Tobacco use 3. Marijuana use  P:   1. ABG's on current ventilator settings. 2. IV solumedrol 3. Bronchodilators  CARDIOVASCULAR A:  1. Hypotension P:  1. Switched to midazolam   GASTROINTESTINAL  P:   1. Pepcid for prophylaxis  HEMATOLOGIC  P: 1. dvt prophylaxis with subq heparin   INFECTIOUS  P:   1. No indication for iv antibiotics. 2. Awaiting respiratory pcr results. Continue isolation until results finalized.  NEUROLOGIC   P:   RASS goal: -3    FAMILY  - Updates: Fiance updated at the bedside.   CRITICAL CARE Performed by: Elayne SnareMichael B Christ Fullenwider, MD   Total critical care time: 45 minutes  Critical care time was exclusive of separately billable procedures and treating other patients.  Critical care was necessary to treat or prevent imminent or life-threatening deterioration.  Critical care was time spent personally by me on the following activities: development of treatment plan with patient and/or surrogate as well as nursing, discussions with consultants, evaluation of patient's response to treatment, examination of patient, obtaining history from patient or surrogate, ordering and performing  treatments and interventions, ordering and review of laboratory studies, ordering and review of radiographic studies, pulse oximetry and re-evaluation of patient's condition.   Charlaine DaltonMichael B. Hyacinth MeekerMiller, M.D. Pulmonary and Critical Care Medicine [facility] Pager: [phone]  06/06/2017, 10:18 AM

## 2017-06-06 NOTE — Progress Notes (Signed)
Peripherally Inserted Central Catheter/Midline Placement  The IV Nurse has discussed with the patient and/or persons authorized to consent for the patient, the purpose of this procedure and the potential benefits and risks involved with this procedure.  The benefits include less needle sticks, lab draws from the catheter, and the patient may be discharged home with the catheter. Risks include, but not limited to, infection, bleeding, blood clot (thrombus formation), and puncture of an artery; nerve damage and irregular heartbeat and possibility to perform a PICC exchange if needed/ordered by physician.  Alternatives to this procedure were also discussed.  Bard Power PICC patient education guide, fact sheet on infection prevention and patient information card has been provided to patient /or left at bedside.    PICC/Midline Placement Documentation  PICC Triple Lumen 06/06/17 PICC Right Brachial 45 cm 1 cm (Active)  Indication for Insertion or Continuance of Line Prolonged intravenous therapies 06/06/2017  4:00 PM  Exposed Catheter (cm) 1 cm 06/06/2017  4:00 PM  Site Assessment Clean;Dry;Intact 06/06/2017  4:00 PM  Lumen #1 Status Flushed;Blood return noted;Saline locked 06/06/2017  4:00 PM  Lumen #2 Status Flushed;Blood return noted;Saline locked 06/06/2017  4:00 PM  Lumen #3 Status Flushed;Blood return noted;Saline locked 06/06/2017  4:00 PM  Dressing Type Transparent 06/06/2017  4:00 PM  Dressing Status Clean;Dry;Intact;Antimicrobial disc in place 06/06/2017  4:00 PM  Dressing Change Due 06/13/17 06/06/2017  4:00 PM       Audrie GallusByerly, Dalasia Predmore Ramos 06/06/2017, 4:29 PM

## 2017-06-06 NOTE — Progress Notes (Signed)
[facility] Pt coughing and agitated when pulled over onto ICU bed. Fent 50mcg bolus given. Pt's bp at time was 129/80. Pt's BP dropped 78/43. Gtts stopped. 500cc NS bolus started MD notified.

## 2017-06-06 NOTE — Procedures (Signed)
Arterial Catheter Insertion Procedure Note Aaron [name] [id number] [birth year]  Procedure: Insertion of Arterial Catheter  Indications: Blood pressure monitoring and Frequent blood sampling  Procedure Details Consent: Risks of procedure as well as the alternatives and risks of each were explained to the (patient/caregiver).  Consent for procedure obtained.   Time Out: Verified patient identification, verified procedure, site/side was marked, verified correct patient position, special equipment/implants available, medications/allergies/relevent history reviewed, required imaging and test results available.  Performed  Maximum sterile technique was used including antiseptics, cap, gloves, gown, hand hygiene, mask and sheet. Skin prep: Chlorhexidine; local anesthetic administered 20 gauge catheter was inserted into right femoral artery using the Seldinger technique.  Evaluation Blood flow good; BP tracing good. Complications: No apparent complications.  Procedure performed under direct supervision of Dr. Hyacinth MeekerMiller and with ultrasound guidance for real time vessel cannulation.     Aaron Aaron Ollis, NP-C [facility] Pgr: [phone] or if no answer [phone] 06/06/2017, 4:48 PM  I was the supervising physiciain.  Elayne SnareMichael B Hadley Detloff, MD Pager [phone]

## 2017-06-06 NOTE — ED Notes (Signed)
[facility] called for transport. 

## 2017-06-06 NOTE — Progress Notes (Signed)
Chaplain encountered some of the patient's family in the 4N waiting area. Chaplain Tori Dattilio is familiar with this family as he attended to them while the Pt[facility]. This is a scenario with complex grief as the patient's father was funeralized on Saturday, February 2nd. The emotional distress, sadness and fresh grief will impact patient's resilience and prognosis.  Spiritual Care will follow Mr. Harrold DonathCrump. At request of family Chaplain went to patient's room and prayed.

## 2017-06-06 NOTE — ED Notes (Signed)
Bed: WLPT3 Expected date:  Expected time:  Means of arrival:  Comments: 

## 2017-06-07 ENCOUNTER — Inpatient Hospital Stay (HOSPITAL_COMMUNITY): Payer: Self-pay

## 2017-06-07 LAB — CBC
HEMATOCRIT: 37.9 % — AB (ref 39.0–52.0)
HEMOGLOBIN: 12.2 g/dL — AB (ref 13.0–17.0)
MCH: 30.1 pg (ref 26.0–34.0)
MCHC: 32.2 g/dL (ref 30.0–36.0)
MCV: 93.6 fL (ref 78.0–100.0)
Platelets: 183 10*3/uL (ref 150–400)
RBC: 4.05 MIL/uL — AB (ref 4.22–5.81)
RDW: 13.9 % (ref 11.5–15.5)
WBC: 11.6 10*3/uL — ABNORMAL HIGH (ref 4.0–10.5)

## 2017-06-07 LAB — BLOOD GAS, ARTERIAL
ACID-BASE EXCESS: 2.2 mmol/L — AB (ref 0.0–2.0)
Bicarbonate: 28 mmol/L (ref 20.0–28.0)
DRAWN BY: 51147
FIO2: 40
O2 SAT: 96.5 %
PCO2 ART: 59 mmHg — AB (ref 32.0–48.0)
PEEP: 5 cmH2O
PH ART: 7.298 — AB (ref 7.350–7.450)
Patient temperature: 98.6
RATE: 16 resp/min
VT: 460 mL
pO2, Arterial: 92 mmHg (ref 83.0–108.0)

## 2017-06-07 LAB — BASIC METABOLIC PANEL
Anion gap: 8 (ref 5–15)
BUN: 12 mg/dL (ref 6–20)
CHLORIDE: 109 mmol/L (ref 101–111)
CO2: 25 mmol/L (ref 22–32)
Calcium: 8.7 mg/dL — ABNORMAL LOW (ref 8.9–10.3)
Creatinine, Ser: 1.27 mg/dL — ABNORMAL HIGH (ref 0.61–1.24)
GFR calc Af Amer: >60 mL/min (ref >60)
Glucose, Bld: 166 mg/dL — ABNORMAL HIGH (ref 65–99)
POTASSIUM: 4.1 mmol/L (ref 3.5–5.1)
Sodium: 142 mmol/L (ref 135–145)

## 2017-06-07 LAB — GLUCOSE, CAPILLARY
GLUCOSE-CAPILLARY: 120 mg/dL — AB (ref 65–99)
Glucose-Capillary: 132 mg/dL — ABNORMAL HIGH (ref 65–99)
Glucose-Capillary: 139 mg/dL — ABNORMAL HIGH (ref 65–99)

## 2017-06-07 LAB — MAGNESIUM: MAGNESIUM: 2.2 mg/dL (ref 1.7–2.4)

## 2017-06-07 LAB — PHOSPHORUS: Phosphorus: 2.1 mg/dL — ABNORMAL LOW (ref 2.5–4.6)

## 2017-06-07 MED ORDER — PRO-STAT SUGAR FREE PO LIQD
30.0000 mL | Freq: Two times a day (BID) | ORAL | Status: DC
  Administered 2017-06-07 – 2017-06-08 (×4): 30 mL
  Filled 2017-06-07 (×4): qty 30

## 2017-06-07 MED ORDER — VITAL HIGH PROTEIN PO LIQD
1000.0000 mL | ORAL | Status: DC
  Administered 2017-06-07 – 2017-06-09 (×3): 1000 mL

## 2017-06-07 MED ORDER — SODIUM GLYCEROPHOSPHATE 1 MMOLE/ML IV SOLN
20.0000 mmol | Freq: Once | INTRAVENOUS | Status: AC
  Administered 2017-06-07: 20 mmol via INTRAVENOUS
  Filled 2017-06-07: qty 20

## 2017-06-07 MED ORDER — VITAL HIGH PROTEIN PO LIQD: 1000.0000 mL | ORAL | Status: DC

## 2017-06-07 MED ORDER — VITAL HIGH PROTEIN PO LIQD
1000.0000 mL | ORAL | Status: DC
  Administered 2017-06-07: 1000 mL

## 2017-06-07 NOTE — Progress Notes (Signed)
Versed infusion stopped at 1320, per Dr. Hyacinth MeekerMiller, due to oversedation. Remains on propofol.

## 2017-06-07 NOTE — Progress Notes (Addendum)
Initial Nutrition Assessment  DOCUMENTATION CODES:   Not applicable  INTERVENTION:   - Increase Vital High Protein to 50 ml/hr with Pro-stat 30 ml BID.  Tube feeding regimen provides 1400 kcal, 135 grams of protein, and 1008 ml of H2O.   Tube feeding regimen and current propofol provides 2010 total kcal (98% of estimated energy needs) and 135 grams protein (100% estimated protein needs)  NUTRITION DIAGNOSIS:   Inadequate oral intake related to inability to eat as evidenced by NPO status.  GOAL:   Patient will meet greater than or equal to 90% of their needs  MONITOR:   Vent status, Labs, I & O's, TF tolerance  REASON FOR ASSESSMENT:   Consult Enteral/tube feeding initiation and management  ASSESSMENT:   47 year old male admitted for asthma exacerbation.  Patient is currently intubated on ventilator support MV: 7.5 L/min Temp (24hrs), Avg:99.1 F (37.3 C), Min:96.6 F (35.9 C), Max:100.6 F (38.1 C)  Propofol: 23.1 ml/hr (610 kcal/day)  06/06/17 - intubated, PICC placed  Pt with OG tube on low intermittent suction. Vital HP infusing at 40 ml/hr at time of visit.  Per discussion with lead clinical RD, pt likely extubated on Friday. Plans to feed pt through OG tube at this time rather than placing and feeding through Cortrak. Per discussion with RN, pt likely to be weaned off sedation tomorrow.  I/O's: +2.5 L since admission  Medications reviewed and include: Pepcid 20 mg BID, IV glycophos 20 mmol  Labs reviewed: creatinine 1.27 (H), calcium 8.7 (L), phosphorus 2.1 (L) CBG: 132 this AM  NUTRITION - FOCUSED PHYSICAL EXAM:    Most Recent Value  Orbital Region  No depletion  Upper Arm Region  No depletion  Thoracic and Lumbar Region  No depletion  Buccal Region  Unable to assess  Temple Region  No depletion  Clavicle Bone Region  No depletion  Clavicle and Acromion Bone Region  No depletion  Scapular Bone Region  Unable to assess  Dorsal Hand  No depletion   Patellar Region  No depletion  Anterior Thigh Region  No depletion  Posterior Calf Region  No depletion  Edema (RD Assessment)  None  Hair  Reviewed  Eyes  Unable to assess  Mouth  Unable to assess  Skin  Reviewed  Nails  Reviewed       Diet Order:  Diet NPO time specified  EDUCATION NEEDS:   Not appropriate for education at this time  Skin:  Skin Assessment: Reviewed RN Assessment  Last BM:  06/06/17  Height:   Ht Readings from Last 1 Encounters:  06/06/17 6' (1.829 m)    Weight:   Wt Readings from Last 1 Encounters:  06/07/17 178 lb 5.6 oz (80.9 kg)    Ideal Body Weight:  81 kg  BMI:  Body mass index is 24.19 kg/m.  Estimated Nutritional Needs:   Kcal:  2049 kcal/day  Protein:  120-135 grams/day  Fluid:  > 2.0 L/day    Earma ReadingKate Jablonski Jensyn Cambria, MS, RD, LDN Pager: [phone] Weekend/After Hours: [phone]

## 2017-06-07 NOTE — Progress Notes (Signed)
Pt becoming more agitated/restless, not tolerating the vent; tachypneic in the 30's and tachycardic in the 130's. Versed infusion restarted. Pt also repositioned and lights turned down to reduce room stimulation. Will continue to monitor the pt for sedation level and vent toleration level.

## 2017-06-07 NOTE — Progress Notes (Signed)
PULMONARY / CRITICAL CARE MEDICINE   Name: Aaron [name] MRN: [id number] DOB: 1970/08/09    ADMISSION DATE:  06/06/2017   HISTORY OF PRESENT ILLNESS:   46yoM with hx of Asthma, presented early this AM to the [facility] ER c/o SOB x 2 days. Denied F/C, CP. Admitted to chronic cough productive of clear sputum but no changes in sputum quality or quantity. In the ER he was given nebulizer treatments and magnesium with no improvement. He was found to be tripoding and became agitated when BIPAP was attempted. Therefore patient was intubated.   At time of my exam patient is intubated but under-sedated, sitting up on stretcher, lifting legs in air, coughing. His fiancee is present in the room who assists with the history. She says he has been stressed out lately as his father just died. She reports he has not run out of his home asthma medications though. She also reports no illicit drug use. Reports he has not had F/C, Cp, Muscle aches, or changes changes in his chronic cough or sputum  Patient required deep sedation in order to synchronize with the ventilator yesterday. Hypotension developed with the addition of propofol. Patient received an arterial line and central line, and was started on Levophed with improvement in blood pressure. Urine output was > 0.5cc/kg/hour overnight. He received one dose of rocuronium yesterday so that the ideal Vt (6cc/kg/ibw), rate, plateau pressure and auto peep measurements could be taken.  Patient is sedate with a RASS of -3 to -4. Ventilator synchrony observed today.   PAST MEDICAL HISTORY :  He  has a past medical history of Asthma.  PAST SURGICAL HISTORY: He  has a past surgical history that includes Abdominal surgery and Hernia repair.  No Known Allergies  No current facility-administered medications on file prior to encounter.    Current Outpatient Medications on File Prior to Encounter  Medication Sig  . albuterol (PROVENTIL HFA;VENTOLIN HFA) 108 (90 Base)  MCG/ACT inhaler Inhale 1-2 puffs into the lungs every 6 (six) hours as needed for wheezing or shortness of breath.  Marland Kitchen albuterol (PROVENTIL) (5 MG/ML) 0.5% nebulizer solution Take 0.5 mLs (2.5 mg total) by nebulization every 6 (six) hours as needed for wheezing or shortness of breath.  . fluticasone (FLOVENT HFA) 110 MCG/ACT inhaler Inhale 2 puffs into the lungs 2 (two) times daily.  Marland Kitchen albuterol (PROVENTIL HFA;VENTOLIN HFA) 108 (90 Base) MCG/ACT inhaler Inhale 1-2 puffs into the lungs every 4 (four) hours as needed for wheezing or shortness of breath. (Patient not taking: Reported on 03/15/2017)  . albuterol (PROVENTIL) (2.5 MG/3ML) 0.083% nebulizer solution Take 3 mLs (2.5 mg total) by nebulization 2 (two) times daily. (Patient not taking: Reported on 06/06/2017)  . fluticasone (FLOVENT HFA) 110 MCG/ACT inhaler Inhale 2 puffs into the lungs 2 (two) times daily. (Patient not taking: Reported on 02/25/2016)  . loratadine (CLARITIN) 10 MG tablet Take 1 tablet (10 mg total) by mouth daily as needed for allergies. Use Claritin, Zyrtec, or any similar over the counter anti-histamine daily (store brand is fine too) during changes in seasons when you normally experience difficulty with wheezing. (Patient not taking: Reported on 06/06/2017)  . montelukast (SINGULAIR) 10 MG tablet Take 1 tablet (10 mg total) by mouth at bedtime. (Patient not taking: Reported on 06/06/2017)  . predniSONE (DELTASONE) 10 MG tablet Take 4 tablets a day on 10/28 and 10/29 THEN take 2 tablets a day on 10/30 and 10/31 THEN take 1 tablet a day on 11/1 and 11/2  THEN STOP (Patient not taking: Reported on 03/15/2017)  . predniSONE (DELTASONE) 50 MG tablet Take 1 pill daily (Patient not taking: Reported on 03/15/2017)    FAMILY HISTORY:  His indicated that his mother is deceased. He indicated that his father is alive.   SOCIAL HISTORY: He  reports that he has been smoking cigarettes.  He has been smoking about 0.50 packs per day. he has never  used smokeless tobacco. He reports that he drinks alcohol. He reports that he uses drugs. Drug: Marijuana.  REVIEW OF SYSTEMS:   Can not be obtained.  SUBJECTIVE:  Can not be obtained.  VITAL SIGNS: BP 113/62   Pulse (!) 107   Temp 98.1 F (36.7 C)   Resp 12   Ht 6' (1.829 m)   Wt 80.9 kg (178 lb 5.6 oz)   SpO2 96%   BMI 24.19 kg/m   HEMODYNAMICS:    VENTILATOR SETTINGS: Vent Mode: Other (Comment) FiO2 (%):  [40 %-50 %] 40 % Set Rate:  [14 bmp-20 bmp] 14 bmp Vt Set:  [460 mL] 460 mL PEEP:  [5 cmH20] 5 cmH20 Plateau Pressure:  [11 cmH20-25 cmH20] 11 cmH20  INTAKE / OUTPUT: I/O last 3 completed shifts: In: 5304.6 [I.V.:3554.6; Other:500; IV Piggyback:1250] Out: 3230 [Urine:3030; Emesis/NG output:200]  PHYSICAL EXAMINATION: General:  No distress observed. Neuro:  Rass of -3. PERL HEENT:  No jvd, icterus. OGT/ETT. No subq emphysema. Cardiovascular:  Normal s1s2, regular rhythm. No murmur or gallop. No rub. Lungs:  Bilateral breath sounds. Expiratory wheezes. No rhonchi or crackles. Trachea is midline. Abdomen:  Flat, soft, + bowel sounds. No organomegaly. Old healed midline scar. Musculoskeletal:  Soft, no elicited tenderness. +2 pulses. No cyanosis. Skin:  No edema. Right femoral arterial catheter. Right arm PICC.   LABS:  BMET Recent Labs  Lab 06/06/17 0333 06/06/17 0644 06/07/17 0445  NA 142 142 142  K 3.9 3.4* 4.1  CL 103 109 109  CO2  --  24 25  BUN 14 14 12   CREATININE 1.00 1.33* 1.27*  GLUCOSE 104* 149* 166*    Electrolytes Recent Labs  Lab 06/06/17 0644 06/07/17 0445  CALCIUM 8.0* 8.7*  MG 2.2 2.2  PHOS 5.0* 2.1*    CBC Recent Labs  Lab 06/06/17 0310 06/06/17 0333 06/06/17 0644 06/07/17 0445  WBC 7.6  --  9.5 11.6*  HGB 14.3 15.0 12.7* 12.2*  HCT 42.2 44.0 38.8* 37.9*  PLT 208  --  164 183    Coag's No results for input(s): APTT, INR in the last 168 hours.  Sepsis Markers No results for input(s): LATICACIDVEN, PROCALCITON,  O2SATVEN in the last 168 hours.  ABG Recent Labs  Lab 06/06/17 1701 06/06/17 2159 06/07/17 0440  PHART 7.199* 7.278* 7.298*  PCO2ART 68.6* 53.4* 59.0*  PO2ART 106.0 83.0 92.0    Liver Enzymes Recent Labs  Lab 06/06/17 0644  AST 30  ALT 28  ALKPHOS 64  BILITOT 0.4  ALBUMIN 3.9    Cardiac Enzymes Recent Labs  Lab 06/06/17 0644  TROPONINI <0.03    Glucose No results for input(s): GLUCAP in the last 168 hours.  Imaging Dg Chest Port 1 View  Result Date: 06/07/2017 CLINICAL DATA:  Respiratory failure EXAM: PORTABLE CHEST 1 VIEW COMPARISON:  06/06/2017 FINDINGS: Cardiac shadow is within normal limits. Endotracheal tube and nasogastric catheter and right-sided PICC line are noted in satisfactory position. The lungs are well aerated bilaterally. No focal infiltrate or sizable effusion is noted. IMPRESSION: Tubes and lines as described  above.  No acute abnormality noted. Electronically Signed   By: Alcide CleverMark  Lukens M.D.   On: 06/07/2017 09:39     SIGNIFICANT EVENTS: None  LINES/TUBES: Right femoral arterial line 2/5 Right UE PICC 2/5  DISCUSSION: 47 y.o male admitted for status asthmaticus, requiring intubation.  ASSESSMENT / PLAN:  PULMONARY A: 1. Status asthmaticus 2. Acute respiratory failure with hypercapnia and hypoxemia P:   1. Continue volume controlled ventilation. 2. Assess abg's daily. Consider sbt when hypercapnia resolves. 3. Continue solumedrol 4. Continue LABA/ICS, q4 hr duoneb  CARDIOVASCULAR A:  1. Hypotension likely due to sedation. P:  1. Continue levophed to maintain map > 60mmHg. Wean as tolerated.   RENAL A:   1. AKI P:   1. Suspect due to hypotension. Continue iv fluids, monitor renal function. 2. Replete electrolytes as required.  GASTROINTESTINAL P:   1. Continue pepcid 2. Begin enteral feeding.  HEMATOLOGIC P:  1. DVT prophylaxis with heparin.  INFECTIOUS A:   None  ENDOCRINE A:   None  NEUROLOGIC A:    Sedated P:   RASS goal: -3 to -4    FAMILY  - Updates: Discussed care plan with fiance at the bedside.  CRITICAL CARE Performed by: Elayne SnareMichael B Lorena Benham   Total critical care time: 45 minutes  Critical care time was exclusive of separately billable procedures and treating other patients.  Critical care was necessary to treat or prevent imminent or life-threatening deterioration.  Critical care was time spent personally by me on the following activities: development of treatment plan with patient and/or surrogate as well as nursing, discussions with consultants, evaluation of patient's response to treatment, examination of patient, obtaining history from patient or surrogate, ordering and performing treatments and interventions, ordering and review of laboratory studies, ordering and review of radiographic studies, pulse oximetry and re-evaluation of patient's condition.   Charlaine DaltonMichael B. Hyacinth MeekerMiller, MD Pulmonary and Critical Care Medicine [facility] Pager: [phone]  06/07/2017, 9:49 AM

## 2017-06-07 NOTE — Progress Notes (Signed)
Shortly after finishing bathing pt he became dysynchronous with the vent, as was reported during previous day shift. Fentanyl gtt added to assist with sedation. Increased Propofol gtt as well. RT notified as well and they came and assessed pt.

## 2017-06-08 ENCOUNTER — Inpatient Hospital Stay (HOSPITAL_COMMUNITY): Payer: Self-pay

## 2017-06-08 LAB — POCT I-STAT 3, ART BLOOD GAS (G3+)
ACID-BASE EXCESS: 3 mmol/L — AB (ref 0.0–2.0)
Bicarbonate: 28.1 mmol/L — ABNORMAL HIGH (ref 20.0–28.0)
O2 Saturation: 96 %
PH ART: 7.39 (ref 7.350–7.450)
TCO2: 29 mmol/L (ref 22–32)
pCO2 arterial: 46.7 mmHg (ref 32.0–48.0)
pO2, Arterial: 83 mmHg (ref 83.0–108.0)

## 2017-06-08 LAB — BLOOD GAS, ARTERIAL
Acid-Base Excess: 2 mmol/L (ref 0.0–2.0)
Bicarbonate: 27.7 mmol/L (ref 20.0–28.0)
Drawn by: 40415
FIO2: 40
MECHVT: 460 mL
O2 Saturation: 93 %
PEEP: 5 cmH2O
Patient temperature: 98.6
RATE: 14 resp/min
pCO2 arterial: 57.2 mmHg — ABNORMAL HIGH (ref 32.0–48.0)
pH, Arterial: 7.306 — ABNORMAL LOW (ref 7.350–7.450)
pO2, Arterial: 76.1 mmHg — ABNORMAL LOW (ref 83.0–108.0)

## 2017-06-08 LAB — POTASSIUM: Potassium: 5 mmol/L (ref 3.5–5.1)

## 2017-06-08 LAB — GLUCOSE, CAPILLARY
GLUCOSE-CAPILLARY: 102 mg/dL — AB (ref 65–99)
GLUCOSE-CAPILLARY: 125 mg/dL — AB (ref 65–99)
GLUCOSE-CAPILLARY: 134 mg/dL — AB (ref 65–99)
Glucose-Capillary: 145 mg/dL — ABNORMAL HIGH (ref 65–99)
Glucose-Capillary: 139 mg/dL — ABNORMAL HIGH (ref 65–99)
Glucose-Capillary: 128 mg/dL — ABNORMAL HIGH (ref 65–99)

## 2017-06-08 LAB — BASIC METABOLIC PANEL
Anion gap: 11 (ref 5–15)
BUN: 27 mg/dL — ABNORMAL HIGH (ref 6–20)
CO2: 25 mmol/L (ref 22–32)
Calcium: 8.6 mg/dL — ABNORMAL LOW (ref 8.9–10.3)
Chloride: 109 mmol/L (ref 101–111)
Creatinine, Ser: 1.15 mg/dL (ref 0.61–1.24)
GFR calc Af Amer: >60 mL/min (ref >60)
GFR calc non Af Amer: >60 mL/min (ref >60)
Glucose, Bld: 130 mg/dL — ABNORMAL HIGH (ref 65–99)
Potassium: 4.5 mmol/L (ref 3.5–5.1)
Sodium: 145 mmol/L (ref 135–145)

## 2017-06-08 LAB — TSH: TSH: 0.071 u[IU]/mL — AB (ref 0.350–4.500)

## 2017-06-08 LAB — T4, FREE: Free T4: 0.98 ng/dL (ref 0.61–1.12)

## 2017-06-08 LAB — PHOSPHORUS: Phosphorus: 3.1 mg/dL (ref 2.5–4.6)

## 2017-06-08 MED ORDER — FENTANYL CITRATE (PF) 100 MCG/2ML IJ SOLN
25.0000 ug | INTRAMUSCULAR | Status: DC | PRN
  Administered 2017-06-08 (×2): 100 ug via INTRAVENOUS
  Administered 2017-06-08: 50 ug via INTRAVENOUS
  Filled 2017-06-08 (×3): qty 2

## 2017-06-08 MED ORDER — DEXMEDETOMIDINE HCL IN NACL 200 MCG/50ML IV SOLN
0.4000 ug/kg/h | INTRAVENOUS | Status: DC
  Administered 2017-06-08: 0.4 ug/kg/h via INTRAVENOUS
  Administered 2017-06-08: 1.5 ug/kg/h via INTRAVENOUS
  Administered 2017-06-08: 0.8 ug/kg/h via INTRAVENOUS
  Filled 2017-06-08 (×3): qty 50

## 2017-06-08 MED ORDER — METOPROLOL TARTRATE 5 MG/5ML IV SOLN
INTRAVENOUS | Status: AC
  Filled 2017-06-08: qty 5

## 2017-06-08 MED ORDER — MIDAZOLAM HCL 2 MG/2ML IJ SOLN: 2.0000 mg | INTRAMUSCULAR | Status: DC | PRN

## 2017-06-08 MED ORDER — DEXMEDETOMIDINE HCL 200 MCG/2ML IV SOLN
0.4000 ug/kg/h | INTRAVENOUS | Status: DC
  Administered 2017-06-08: 1.2 ug/kg/h via INTRAVENOUS
  Administered 2017-06-08: 1.5 ug/kg/h via INTRAVENOUS
  Administered 2017-06-08: 1.3 ug/kg/h via INTRAVENOUS
  Administered 2017-06-09 (×2): 1.2 ug/kg/h via INTRAVENOUS
  Filled 2017-06-08 (×5): qty 4

## 2017-06-08 MED ORDER — METOPROLOL TARTRATE 5 MG/5ML IV SOLN
5.0000 mg | Freq: Once | INTRAVENOUS | Status: AC
  Administered 2017-06-08: 5 mg via INTRAVENOUS

## 2017-06-08 MED ORDER — METHYLPREDNISOLONE SODIUM SUCC 125 MG IJ SOLR
60.0000 mg | Freq: Three times a day (TID) | INTRAMUSCULAR | Status: DC
  Administered 2017-06-08 – 2017-06-10 (×6): 60 mg via INTRAVENOUS
  Filled 2017-06-08 (×6): qty 2

## 2017-06-08 MED ORDER — IPRATROPIUM-ALBUTEROL 0.5-2.5 (3) MG/3ML IN SOLN
3.0000 mL | Freq: Four times a day (QID) | RESPIRATORY_TRACT | Status: DC
  Administered 2017-06-08 – 2017-06-10 (×7): 3 mL via RESPIRATORY_TRACT
  Filled 2017-06-08 (×7): qty 3

## 2017-06-08 NOTE — Progress Notes (Signed)
PULMONARY / CRITICAL CARE MEDICINE   Name: Aaron [name] MRN: [id number] DOB: 01/28/1971    ADMISSION DATE:  06/06/2017   BRIEF SUMMARY:   47yoM with hx of Asthma, presented 2/5 AM to the [facility] ER c/o SOB x 2 days. Denied F/C, CP. Admitted to chronic cough productive of clear sputum but no changes in sputum quality or quantity. In the ER he was given nebulizer treatments and magnesium with no improvement. He was found to be tripoding and became agitated when BIPAP was attempted.  He did not tolerate BiPAP and was intubated. Ventilator synchrony achieved with deep sedation.  He required 1x dose rocuronium.     SUBJECTIVE:  RN reports intermittent agitation when sedation lightened.  Episode of AF this am with RVR.    VITAL SIGNS: BP 111/62   Pulse (!) 120   Temp 99.7 F (37.6 C)   Resp 18   Ht 6' (1.829 m)   Wt 184 lb 15.5 oz (83.9 kg)   SpO2 96%   BMI 25.09 kg/m   HEMODYNAMICS:    VENTILATOR SETTINGS: Vent Mode: Other (Comment) FiO2 (%):  [40 %] 40 % Set Rate:  [14 bmp] 14 bmp Vt Set:  [460 mL] 460 mL PEEP:  [5 cmH20] 5 cmH20 Plateau Pressure:  [12 cmH20-15 cmH20] 14 cmH20  INTAKE / OUTPUT: I/O last 3 completed shifts: In: 5423.2 [I.V.:4432.5; NG/GT:990.7] Out: 4500 [Urine:4500]  PHYSICAL EXAMINATION: General: well developed adult male lying in bed on vent   HEENT: MM pink/moist, ETT Neuro: sedate  CV: s1s2 irr irr, no m/r/g PULM: even/non-labored, lungs bilaterally with improved air movement, faint wheeze  WU:JWJX, non-tender, bsx4 active  Extremities: warm/dry, trace generalized edema, RUE PICC Skin: no rashes or lesions  LABS:  BMET Recent Labs  Lab 06/06/17 0644 06/07/17 0445 06/08/17 0452  NA 142 142 145  K 3.4* 4.1 4.5  CL 109 109 109  CO2 24 25 25   BUN 14 12 27*  CREATININE 1.33* 1.27* 1.15  GLUCOSE 149* 166* 130*    Electrolytes Recent Labs  Lab 06/06/17 0644 06/07/17 0445 06/08/17 0452  CALCIUM 8.0* 8.7* 8.6*  MG 2.2 2.2  --   PHOS  5.0* 2.1* 3.1    CBC Recent Labs  Lab 06/06/17 0310 06/06/17 0333 06/06/17 0644 06/07/17 0445  WBC 7.6  --  9.5 11.6*  HGB 14.3 15.0 12.7* 12.2*  HCT 42.2 44.0 38.8* 37.9*  PLT 208  --  164 183    Coag's No results for input(s): APTT, INR in the last 168 hours.  Sepsis Markers No results for input(s): LATICACIDVEN, PROCALCITON, O2SATVEN in the last 168 hours.  ABG Recent Labs  Lab 06/06/17 2159 06/07/17 0440 06/08/17 0355  PHART 7.278* 7.298* 7.306*  PCO2ART 53.4* 59.0* 57.2*  PO2ART 83.0 92.0 76.1*    Liver Enzymes Recent Labs  Lab 06/06/17 0644  AST 30  ALT 28  ALKPHOS 64  BILITOT 0.4  ALBUMIN 3.9    Cardiac Enzymes Recent Labs  Lab 06/06/17 0644  TROPONINI <0.03    Glucose Recent Labs  Lab 06/07/17 1129 06/07/17 2025 06/07/17 2327 06/08/17 0336 06/08/17 0830  GLUCAP 132* 120* 139* 125* 102*    Imaging Dg Chest Port 1 View  Result Date: 06/08/2017 CLINICAL DATA:  Asthma. EXAM: PORTABLE CHEST 1 VIEW COMPARISON:  Radiograph of June 07, 2017. FINDINGS: The heart size and mediastinal contours are within normal limits. Both lungs are clear. Endotracheal and nasogastric tubes are unchanged in position. Right-sided PICC line  is unchanged in position. No pneumothorax or pleural effusion is noted. The visualized skeletal structures are unremarkable. IMPRESSION: Stable support apparatus. No acute cardiopulmonary abnormality seen. Electronically Signed   By: Lupita RaiderJames  Green Jr, M.D.   On: 06/08/2017 09:09    SIGNIFICANT EVENTS: 2/05  Admit  20/6  Improved vent synchrony   LINES/TUBES: R Femoral A-Line 2/5 >>  RUE PICC 2/5 >>   DISCUSSION: 47 y.o male admitted for status asthmaticus, requiring intubation.  ASSESSMENT / PLAN:  PULMONARY A: Status asthmaticus Acute respiratory failure with hypercapnia and hypoxemia P:   Change to [facility] 8cc/kg  Transition sedation to allow for SBT  Follow Peak/Plateau pressures Follow ABG Reduce solumedrol to  60 mg IV Q8 Continue Brovana + Pulmicort BID  PRN albuterol   CARDIOVASCULAR A:  Hypotension - sedation related  AF with RVR - new 2/7 P:  ICU Monitoring Monitor rhythm, hopeful for spontaneous conversion  Lopressor 5mg  IV x1 Levophed as needed to maintain MAP > 65 Discontinue aline if patient tolerates precedex    RENAL A:   AKI - suspect due to hypotension P:   Trend BMP / urinary output Replace electrolytes as indicated Avoid nephrotoxic agents, ensure adequate renal perfusion  GASTROINTESTINAL A: At Risk Malnutrition  P:   NPO  TF per Nutrition  Pepcid  HEMATOLOGIC A: DVT Prophylaxis  P:  SQ heparin for DVT prophylaxis   INFECTIOUS A:   P:  ENDOCRINE A:   P:  NEUROLOGIC A:   Sedation Needs for Vent Synchrony  P:   RASS goal: -2  Begin to lighten sedation for SBT  Propofol for sedation PRN Versed May need to transition to precedex to allow for weaning / extubation  Passive ROM   FAMILY  - Updates:  Family updated at bedside am 2/7 per Dr. Hyacinth MeekerMiller.      Canary BrimBrandi Ollis, NP-C [facility] Pgr: [phone] or if no answer [phone] 06/08/2017, 9:23 AM  Attending:  I have seen and examined the patient with nurse practitioner/resident and agree with the note above.  We formulated the plan together and I elicited the following history.    Subjective: Status asthmaticus  Objective: Vitals:   06/08/17 1100 06/08/17 1125 06/08/17 1132 06/08/17 1200  BP: 108/65   105/71  Pulse: (!) 108   (!) 110  Resp: 17   16  Temp: 99.9 F (37.7 C)   99.3 F (37.4 C)  TempSrc:      SpO2: 97% 96% 96% 96%  Weight:      Height:       Vent Mode: PRVC FiO2 (%):  [40 %] 40 % Set Rate:  [14 bmp-16 bmp] 16 bmp Vt Set:  [460 mL-580 mL] 580 mL PEEP:  [5 cmH20] 5 cmH20 Plateau Pressure:  [12 cmH20-17 cmH20] 17 cmH20  Intake/Output Summary (Last 24 hours) at 06/08/2017 1206 Last data filed at 06/08/2017 1200 Gross per 24 hour  Intake 3666.99 ml   Output 1975 ml  Net 1691.99 ml    General: well developed adult male lying in bed on vent. RASS -3   HEENT: ETT/OGT. PERL Neuro: sedate  CV: s1s2 irr irr, no m/r/g. ecg monitor reveals atrial fibrillation with RVR PULM: even/non-labored, lungs bilaterally with improved air movement, faint wheeze. No crackles or rhonchi. IO:NGEXGI:soft, non-tender, bsx4 active  Extremities: warm/dry, trace generalized edema, RUE PICC Skin: no rashes or lesions   CBC    Component Value Date/Time   WBC 11.6 (H) 06/07/2017 0445  RBC 4.05 (L) 06/07/2017 0445   HGB 12.2 (L) 06/07/2017 0445   HCT 37.9 (L) 06/07/2017 0445   PLT 183 06/07/2017 0445   MCV 93.6 06/07/2017 0445   MCH 30.1 06/07/2017 0445   MCHC 32.2 06/07/2017 0445   RDW 13.9 06/07/2017 0445   LYMPHSABS 1.2 06/06/2017 0644   MONOABS 0.7 06/06/2017 0644   EOSABS 0.2 06/06/2017 0644   BASOSABS 0.0 06/06/2017 0644    BMET    Component Value Date/Time   NA 145 06/08/2017 0452   K 4.5 06/08/2017 0452   CL 109 06/08/2017 0452   CO2 25 06/08/2017 0452   GLUCOSE 130 (H) 06/08/2017 0452   BUN 27 (H) 06/08/2017 0452   CREATININE 1.15 06/08/2017 0452   CALCIUM 8.6 (L) 06/08/2017 0452   GFRNONAA >60 06/08/2017 0452   GFRAA >60 06/08/2017 0452    CXR images  DG CHEST PORT 1 VIEW  Final Result    DG CHEST PORT 1 VIEW  Final Result    DG Chest Port 1 View  Final Result    DG Abdomen 1 View  Final Result    DG Chest Port 1 View  Final Result    DG Chest 2 View  Final Result    DG Chest Port 1 View    (Results Pending)   ECG: atrial fibrillation with RVR  ASSESSMENT / PLAN:  PULMONARY A: Status asthmaticus Acute respiratory failure with hypercapnia and hypoxemia P:   Change to PRVC 8cc/kg. abg's after adjustment. Transition sedation to allow for SBT. Goal is to change from propofol to precedex. Follow Peak/Plateau pressures Reduce solumedrol to 60 mg IV Q8 Continue Brovana + Pulmicort BID  PRN albuterol    CARDIOVASCULAR A:  Hypotension - sedation related; resolved. AF with RVR - new 2/7 P:  ICU Monitoring Monitor rhythm, hopeful for spontaneous conversion  Lopressor 5mg  IV x1. Rate reduced to approximately 110/min.  Discontinue aline if patient tolerates precedex    RENAL A:   AKI - suspect due to hypotension P:   Trend BMP / urinary output. U.O. Adequate, creatinine trending downwards. Replace electrolytes as indicated Avoid nephrotoxic agents, ensure adequate renal perfusion  GASTROINTESTINAL A: At Risk Malnutrition  P:   NPO  TF per Nutrition  Pepcid bid  HEMATOLOGIC A: DVT Prophylaxis  P:  SQ heparin for DVT prophylaxis   INFECTIOUS A:   P:  ENDOCRINE A:   P:  NEUROLOGIC A:   Sedation Needs for Vent Synchrony  P:   RASS goal: -2 to _3 Begin to lighten sedation for SBT  Precedex for sedation PRN Versed Passive ROM   My cc time 50 minutes  Elayne Snare, MD [place] [facility] Pager: [phone]

## 2017-06-09 ENCOUNTER — Other Ambulatory Visit: Payer: Self-pay

## 2017-06-09 ENCOUNTER — Inpatient Hospital Stay (HOSPITAL_COMMUNITY): Payer: Self-pay

## 2017-06-09 LAB — BASIC METABOLIC PANEL
Anion gap: 13 (ref 5–15)
BUN: 35 mg/dL — AB (ref 6–20)
CALCIUM: 8.8 mg/dL — AB (ref 8.9–10.3)
CO2: 22 mmol/L (ref 22–32)
CREATININE: 1.1 mg/dL (ref 0.61–1.24)
Chloride: 108 mmol/L (ref 101–111)
Glucose, Bld: 136 mg/dL — ABNORMAL HIGH (ref 65–99)
Potassium: 4.8 mmol/L (ref 3.5–5.1)
SODIUM: 143 mmol/L (ref 135–145)

## 2017-06-09 LAB — CBC
HCT: 40.9 % (ref 39.0–52.0)
Hemoglobin: 13.5 g/dL (ref 13.0–17.0)
MCH: 30.5 pg (ref 26.0–34.0)
MCHC: 33 g/dL (ref 30.0–36.0)
MCV: 92.5 fL (ref 78.0–100.0)
PLATELETS: 167 10*3/uL (ref 150–400)
RBC: 4.42 MIL/uL (ref 4.22–5.81)
RDW: 14.3 % (ref 11.5–15.5)
WBC: 15.3 10*3/uL — ABNORMAL HIGH (ref 4.0–10.5)

## 2017-06-09 LAB — GLUCOSE, CAPILLARY
GLUCOSE-CAPILLARY: 112 mg/dL — AB (ref 65–99)
GLUCOSE-CAPILLARY: 136 mg/dL — AB (ref 65–99)
Glucose-Capillary: 125 mg/dL — ABNORMAL HIGH (ref 65–99)
Glucose-Capillary: 117 mg/dL — ABNORMAL HIGH (ref 65–99)
Glucose-Capillary: 96 mg/dL (ref 65–99)
Glucose-Capillary: 106 mg/dL — ABNORMAL HIGH (ref 65–99)

## 2017-06-09 LAB — TSH: TSH: 0.47 u[IU]/mL (ref 0.350–4.500)

## 2017-06-09 LAB — T3, FREE: T3, Free: 2 pg/mL (ref 2.0–4.4)

## 2017-06-09 LAB — TRIGLYCERIDES: TRIGLYCERIDES: 145 mg/dL (ref <150)

## 2017-06-09 MED ORDER — PHENOL 1.4 % MT LIQD
1.0000 | OROMUCOSAL | Status: DC | PRN
  Administered 2017-06-09 – 2017-06-10 (×2): 1 via OROMUCOSAL
  Filled 2017-06-09 (×2): qty 177

## 2017-06-09 MED ORDER — ORAL CARE MOUTH RINSE
15.0000 mL | Freq: Two times a day (BID) | OROMUCOSAL | Status: DC
  Administered 2017-06-09 – 2017-06-11 (×2): 15 mL via OROMUCOSAL

## 2017-06-09 NOTE — Plan of Care (Signed)
Patient voiding without trouble after catheter removal.

## 2017-06-09 NOTE — Plan of Care (Signed)
Patient is less anxious after extubation, voiding using urinal.

## 2017-06-09 NOTE — Progress Notes (Signed)
PULMONARY / CRITICAL CARE MEDICINE   Name: WALEED DETTMAN MRN: [id number] DOB: Dec 26, 1970    ADMISSION DATE:  06/06/2017   BRIEF SUMMARY:   47yoM with hx of Asthma, presented 2/5 AM to the [facility] ER c/o SOB x 2 days. Denied F/C, CP. Admitted to chronic cough productive of clear sputum but no changes in sputum quality or quantity. In the ER he was given nebulizer treatments and magnesium with no improvement. He was found to be tripoding and became agitated when BIPAP was attempted.  He did not tolerate BiPAP and was intubated. Ventilator synchrony achieved with deep sedation.  He required 1x dose rocuronium.     SUBJECTIVE:  Patient on Precedex overnight. SBT today was satisfactory.    VITAL SIGNS: BP 132/89   Pulse 100   Temp 98.4 F (36.9 C)   Resp 12   Ht 5\' 10"  (1.778 m)   Wt 83.6 kg (184 lb 4.9 oz)   SpO2 96%   BMI 26.44 kg/m   HEMODYNAMICS:    VENTILATOR SETTINGS: Vent Mode: PSV;CPAP FiO2 (%):  [30 %-40 %] 40 % Set Rate:  [16 bmp] 16 bmp Vt Set:  [580 mL] 580 mL PEEP:  [5 cmH20] 5 cmH20 Pressure Support:  [5 cmH20] 5 cmH20 Plateau Pressure:  [13 cmH20-18 cmH20] 14 cmH20  INTAKE / OUTPUT: I/O last 3 completed shifts: In: 5391.8 [I.V.:3788.5; NG/GT:1603.3] Out: 3185 [Urine:2685; Emesis/NG output:500]  PHYSICAL EXAMINATION: General: well developed adult male lying in bed on vent. Awake and alert and following commands. HEENT: MM pink/moist, ETT/OGT Neuro: RASS 0 CV: s1s2 irr irr, no m/r/g. Rate 105/min PULM: even/non-labored, lungs bilaterally with improved air movement, faint wheeze. No new findings. JX:BJYN, non-tender, bsx4 active  Extremities: warm/dry, trace generalized edema, RUE PICC Skin: no rashes or lesions  LABS:  BMET Recent Labs  Lab 06/07/17 0445 06/08/17 0452 06/08/17 1724 06/09/17 0636  NA 142 145  --  143  K 4.1 4.5 5.0 4.8  CL 109 109  --  108  CO2 25 25  --  22  BUN 12 27*  --  35*  CREATININE 1.27* 1.15  --  1.10  GLUCOSE 166*  130*  --  136*    Electrolytes Recent Labs  Lab 06/06/17 0644 06/07/17 0445 06/08/17 0452 06/09/17 0636  CALCIUM 8.0* 8.7* 8.6* 8.8*  MG 2.2 2.2  --   --   PHOS 5.0* 2.1* 3.1  --     CBC Recent Labs  Lab 06/06/17 0644 06/07/17 0445 06/09/17 0636  WBC 9.5 11.6* 15.3*  HGB 12.7* 12.2* 13.5  HCT 38.8* 37.9* 40.9  PLT 164 183 167    Coag's No results for input(s): APTT, INR in the last 168 hours.  Sepsis Markers No results for input(s): LATICACIDVEN, PROCALCITON, O2SATVEN in the last 168 hours.  ABG Recent Labs  Lab 06/07/17 0440 06/08/17 0355 06/08/17 1137  PHART 7.298* 7.306* 7.390  PCO2ART 59.0* 57.2* 46.7  PO2ART 92.0 76.1* 83.0    Liver Enzymes Recent Labs  Lab 06/06/17 0644  AST 30  ALT 28  ALKPHOS 64  BILITOT 0.4  ALBUMIN 3.9    Cardiac Enzymes Recent Labs  Lab 06/06/17 0644  TROPONINI <0.03    Glucose Recent Labs  Lab 06/08/17 1213 06/08/17 1601 06/08/17 1939 06/08/17 2332 06/09/17 0428 06/09/17 0825  GLUCAP 145* 139* 128* 134* 136* 125*    Imaging Dg Chest Port 1 View  Result Date: 06/09/2017 CLINICAL DATA:  Acute respiratory failure, hypoxia EXAM: PORTABLE  CHEST 1 VIEW COMPARISON:  06/08/2017 FINDINGS: Right PICC line tip is in the lower right atrium approximately 5.7 cm below the IVC right atrial junction. Endotracheal tube and NG tube are unchanged. Minimal left base atelectasis. Right lung clear. Heart is normal size. IMPRESSION: Left base atelectasis. Right side PICC line tip in the lower right atrium 5.7 cm below the IVC right atrial junction. Electronically Signed   By: Charlett NoseKevin  Dover M.D.   On: 06/09/2017 09:27   Dg Chest Port 1 View  Result Date: 06/08/2017 CLINICAL DATA:  Respiratory failure.  Intubated patient. EXAM: PORTABLE CHEST 1 VIEW COMPARISON:  Single-view of the chest 06/08/2017 and 06/07/2017. FINDINGS: Endotracheal tube remains in place with the tip in good position just below the clavicular heads. Side port of the  patient's NG tube is seen in the stomach. Lungs clear. No pneumothorax or pleural effusion. Heart size is normal. No acute bony abnormality. IMPRESSION: Support tubes and lines in good position.  Lungs clear. Electronically Signed   By: Drusilla Kannerhomas  Dalessio M.D.   On: 06/08/2017 11:52    SIGNIFICANT EVENTS: 2/05  Admit  20/6  Improved vent synchrony   LINES/TUBES: R Femoral A-Line 2/5 >>  RUE PICC 2/5 >>   DISCUSSION: 47 y.o male admitted for status asthmaticus, requiring intubation.  ASSESSMENT / PLAN:  PULMONARY A: Status asthmaticus Acute respiratory failure with hypercapnia and hypoxemia P:   Extubate today. Continue solumedrol Continue Brovana + Pulmicort BID  PRN albuterol   CARDIOVASCULAR A:  Hypotension - sedation related; resolved. AF with RVR - new 2/7 P:  ICU Monitoring Monitor rhythm. May be due to hyperthyroidism vs beta agonist medication. Has not required further rate control.  RENAL A:   AKI - suspect due to hypotension; resolved. P:   Intermittent bmp. Replace electrolytes as indicated Avoid nephrotoxic agents, ensure adequate renal perfusion  GASTROINTESTINAL A: At Risk Malnutrition  P:   Bedside swallowing evaluation. D/C pepcid.  HEMATOLOGIC A: DVT Prophylaxis  P:  SQ heparin for DVT prophylaxis   INFECTIOUS A:   P:  ENDOCRINE A:  Possible hyperthyroidism. TSH is low.  P: serum T3 and T4 wnl. Etiology of low TSH unclear.  NEUROLOGIC A:    P:     My cc time 35 minutes  Elayne SnareMichael B Fawne Hughley, MD [place] [facility] Pager: [phone]

## 2017-06-09 NOTE — Procedures (Signed)
Extubation Procedure Note  Patient Details:   Name: Aaron [name] DOB: Sep 01, 1970 MRN: [id number]   Airway Documentation:     Evaluation  O2 sats: stable throughout Complications: No apparent complications Patient did tolerate procedure well. Bilateral Breath Sounds: Clear   Yes  Patient tolerated wean. Positive for cuff leak. Patient extubated to a 3 Lpm nasal cannula. No signs of dyspnea or stridor noted. Patient resting comfortably. RN at bedside.   Ancil BoozerSmallwood, [place]ria Elsbernd 06/09/2017, 10:31 AM

## 2017-06-10 LAB — GLUCOSE, CAPILLARY
GLUCOSE-CAPILLARY: 109 mg/dL — AB (ref 65–99)
GLUCOSE-CAPILLARY: 98 mg/dL (ref 65–99)
Glucose-Capillary: 107 mg/dL — ABNORMAL HIGH (ref 65–99)
Glucose-Capillary: 91 mg/dL (ref 65–99)

## 2017-06-10 MED ORDER — PREDNISONE 20 MG PO TABS
20.0000 mg | ORAL_TABLET | Freq: Two times a day (BID) | ORAL | Status: DC
  Administered 2017-06-10 – 2017-06-11 (×2): 20 mg via ORAL
  Filled 2017-06-10 (×3): qty 1

## 2017-06-10 MED ORDER — ARFORMOTEROL TARTRATE 15 MCG/2ML IN NEBU
15.0000 ug | INHALATION_SOLUTION | Freq: Two times a day (BID) | RESPIRATORY_TRACT | Status: DC
  Administered 2017-06-10 – 2017-06-11 (×3): 15 ug via RESPIRATORY_TRACT
  Filled 2017-06-10 (×3): qty 2

## 2017-06-10 MED ORDER — DOCUSATE SODIUM 100 MG PO CAPS
100.0000 mg | ORAL_CAPSULE | Freq: Two times a day (BID) | ORAL | Status: DC | PRN
  Filled 2017-06-10: qty 1

## 2017-06-10 NOTE — Progress Notes (Signed)
Transferred pt to 6N 30 at this time.  Pt has no ss of any acute distress.  No c/o pain.  Family aware of transfer.

## 2017-06-10 NOTE — Progress Notes (Signed)
Pt to 6N30 and oriented to room and dept.  Suction set up in room at pt request.

## 2017-06-10 NOTE — Progress Notes (Signed)
[facility] at this time.

## 2017-06-10 NOTE — Progress Notes (Signed)
PULMONARY / CRITICAL CARE MEDICINE   Name: Aaron [name] MRN: [id number] DOB: 1971-02-18    ADMISSION DATE:  06/06/2017   BRIEF SUMMARY:   46yoM with hx of Asthma, [facility] c/o SOB x 2 days. Denied F/C, CP. Admitted to chronic cough productive of clear sputum but no changes in sputum quality or quantity. In the ER he was given nebulizer treatments and magnesium with no improvement. He was found to be tripoding and became agitated when BIPAP was attempted.  He did not tolerate BiPAP and was intubated. Ventilator synchrony achieved with deep sedation.  He required 1x dose rocuronium.     SUBJECTIVE:  Patient extubated yesterday. He is awake and alert.    VITAL SIGNS: BP (!) 142/84   Pulse 93   Temp 99 F (37.2 C) (Oral)   Resp 17   Ht 5\' 10"  (1.778 m)   Wt 81.9 kg (180 lb 8.9 oz)   SpO2 93%   BMI 25.91 kg/m   HEMODYNAMICS:    VENTILATOR SETTINGS:    INTAKE / OUTPUT: I/O last 3 completed shifts: In: 2809.4 [I.V.:2481; NG/GT:328.3] Out: 2025 [Urine:2025]  PHYSICAL EXAMINATION: General: Sitting upright in a chair. Awake and alert. HEENT: no stridor. No jvd. Neuro: no motor deficits. CV: s1s2 regular rhythm. ecg monitor reveals NSR PULM: Bilateral breath sounds. Clear to auscultation. No wheezes or rhonchi. NW:GNFAGI:soft, non-tender, bsx4 active. No organomegaly. Extremities: warm/dry, trace generalized edema, RUE PICC Skin: no rashes or lesions  LABS:  BMET Recent Labs  Lab 06/07/17 0445 06/08/17 0452 06/08/17 1724 06/09/17 0636  NA 142 145  --  143  K 4.1 4.5 5.0 4.8  CL 109 109  --  108  CO2 25 25  --  22  BUN 12 27*  --  35*  CREATININE 1.27* 1.15  --  1.10  GLUCOSE 166* 130*  --  136*    Electrolytes Recent Labs  Lab 06/06/17 0644 06/07/17 0445 06/08/17 0452 06/09/17 0636  CALCIUM 8.0* 8.7* 8.6* 8.8*  MG 2.2 2.2  --   --   PHOS 5.0* 2.1* 3.1  --     CBC Recent Labs  Lab 06/06/17 0644 06/07/17 0445 06/09/17 0636  WBC  9.5 11.6* 15.3*  HGB 12.7* 12.2* 13.5  HCT 38.8* 37.9* 40.9  PLT 164 183 167    Coag's No results for input(s): APTT, INR in the last 168 hours.  Sepsis Markers No results for input(s): LATICACIDVEN, PROCALCITON, O2SATVEN in the last 168 hours.  ABG Recent Labs  Lab 06/07/17 0440 06/08/17 0355 06/08/17 1137  PHART 7.298* 7.306* 7.390  PCO2ART 59.0* 57.2* 46.7  PO2ART 92.0 76.1* 83.0    Liver Enzymes Recent Labs  Lab 06/06/17 0644  AST 30  ALT 28  ALKPHOS 64  BILITOT 0.4  ALBUMIN 3.9    Cardiac Enzymes Recent Labs  Lab 06/06/17 0644  TROPONINI <0.03    Glucose Recent Labs  Lab 06/09/17 1125 06/09/17 1534 06/09/17 2041 06/09/17 2337 06/10/17 0406 06/10/17 0753  GLUCAP 117* 96 106* 112* 107* 109*    Imaging No results found.  SIGNIFICANT EVENTS: 2/05  Admit  20/6  Improved vent synchrony   LINES/TUBES: R Femoral A-Line 2/5 >> 2/8 RUE PICC 2/5 >>   DISCUSSION: 47 y.o male admitted for status asthmaticus, requiring intubation.  ASSESSMENT / PLAN:  PULMONARY A: Status asthmaticus Acute respiratory failure with hypercapnia and hypoxemia; resolved. P:   1. Reduce steroids to 40 mg daily. Change  to po 2. Continue LABA/ICS, prn albuterol. 3. Patient will need outpatient pulmonary follow up for asthma. 4. Patient provided smoking cessation counseling today.  CARDIOVASCULAR A:  Hypotension - sedation related; resolved. AF with RVR - new 2/7 P:  ICU monitoring no longer required. Will transfer to floor. Monitor rhythm. May be due to hyperthyroidism vs beta agonist medication. Has not required further rate control. F/U tsh was normal.  RENAL A:   AKI - suspect due to hypotension; resolved. P:   Intermittent bmp. Replace electrolytes as indicated Avoid nephrotoxic agents, ensure adequate renal perfusion  GASTROINTESTINAL A: At Risk Malnutrition  P:   Bedside swallowing evaluation completed.  HEMATOLOGIC A: DVT Prophylaxis  P:    SQ heparin for DVT prophylaxis   INFECTIOUS A:   P:  ENDOCRINE A:  Possible hyperthyroidism. TSH is low.  P: serum T3 and T4 wnl. Repeat TSH was normal.    My cc time 30 minutes  Elayne Snare, MD [place] [facility] Pager: [phone]

## 2017-06-10 NOTE — Evaluation (Signed)
Physical Therapy Evaluation Patient Details Name: Aaron [name] MRN: [id number] DOB: 08/23/1970 Today's Date: 06/10/2017   History of Present Illness  46yoM with hx of Asthma, [facility] SOB x 2 days. Denied F/C, CP. Admitted to chronic cough productive of clear sputum but no changes in sputum quality or quantity. In the ER he was given nebulizer treatments and magnesium with no improvement. He was found to be tripoding and became agitated when BIPAP was attempted.  He did not tolerate BiPAP and was intubated and subsequently extubate on 2/8.  Clinical Impression  Orders received for PT evaluation. Patient demonstrates deficits in functional mobility as indicated below. Will benefit from continued skilled PT to address deficits and maximize function. Will see as indicated and progress as tolerated.      Follow Up Recommendations No PT follow up;Supervision/Assistance - 24 hour    Equipment Recommendations  Rolling walker with 5" wheels    Recommendations for Other Services       Precautions / Restrictions Precautions Precautions: Fall      Mobility  Bed Mobility               General bed mobility comments: received up with nursing  Transfers Overall transfer level: Needs assistance   Transfers: Sit to/from Stand Sit to Stand: Min guard         General transfer comment: min guard for stability and safety  Ambulation/Gait Ambulation/Gait assistance: Min assist;Min guard Ambulation Distance (Feet): 610 Feet Assistive device: Rolling walker (2 wheeled);None Gait Pattern/deviations: Step-through pattern;Decreased stride length;Drifts right/left;Narrow base of support;Trunk flexed Gait velocity: decreased Gait velocity interpretation: Below normal speed for age/gender General Gait Details: patient min guard to supervision with use of RW, min assist when attempting ambulation without device. VCs for increase cadence and gait speed  Stairs             Wheelchair Mobility    Modified Rankin (Stroke Patients Only)       Balance Overall balance assessment: Needs assistance Sitting-balance support: Feet supported Sitting balance-Leahy Scale: Good       Standing balance-Leahy Scale: Fair Standing balance comment: able to static stand, increased sway noted                             Pertinent Vitals/Pain Pain Assessment: No/denies pain    Home Living Family/patient expects to be discharged to:: Private residence Living Arrangements: Spouse/significant other Available Help at Discharge: Family Type of Home: House Home Access: Stairs to enter Entrance Stairs-Rails: Can reach both Entrance Stairs-Number of Steps: 8 Home Layout: One level        Prior Function Level of Independence: Independent               Hand Dominance   Dominant Hand: Right    Extremity/Trunk Assessment   Upper Extremity Assessment Upper Extremity Assessment: Generalized weakness    Lower Extremity Assessment Lower Extremity Assessment: Generalized weakness       Communication   Communication: No difficulties  Cognition Arousal/Alertness: Awake/alert Behavior During Therapy: Flat affect Overall Cognitive Status: Impaired/Different from baseline Area of Impairment: Problem solving;Awareness;Safety/judgement;Following commands                       Following Commands: Follows one step commands with increased time Safety/Judgement: Decreased awareness of safety;Decreased awareness of deficits Awareness: Intellectual Problem Solving: Slow processing;Requires verbal cues General Comments: patient  with some delayed responses to basic questions at times, also limited in acknowledgement of impairments.      General Comments      Exercises     Assessment/Plan    PT Assessment Patient needs continued PT services  PT Problem List Decreased strength;Decreased activity tolerance;Decreased  balance;Decreased mobility;Decreased cognition       PT Treatment Interventions DME instruction;Gait training;Stair training;Functional mobility training;Therapeutic activities;Therapeutic exercise;Balance training;Cognitive remediation;Patient/family education    PT Goals (Current goals can be found in the Care Plan section)  Acute Rehab PT Goals Patient Stated Goal: to go home PT Goal Formulation: With patient/family Time For Goal Achievement: 06/24/17 Potential to Achieve Goals: Good    Frequency Min 3X/week   Barriers to discharge        Co-evaluation               AM-PAC PT "6 Clicks" Daily Activity  Outcome Measure Difficulty turning over in bed (including adjusting bedclothes, sheets and blankets)?: None Difficulty moving from lying on back to sitting on the side of the bed? : A Little Difficulty sitting down on and standing up from a chair with arms (e.g., wheelchair, bedside commode, etc,.)?: A Little Help needed moving to and from a bed to chair (including a wheelchair)?: A Little Help needed walking in hospital room?: A Little Help needed climbing 3-5 steps with a railing? : A Little 6 Click Score: 19    End of Session Equipment Utilized During Treatment: Gait belt Activity Tolerance: Patient tolerated treatment well Patient left: in chair;with call bell/phone within reach;with family/visitor present Nurse Communication: Mobility status PT Visit Diagnosis: Unsteadiness on feet (R26.81)    Time: 4098-1191 PT Time Calculation (min) (ACUTE ONLY): 17 min   Charges:   PT Evaluation $PT Eval Moderate Complexity: 1 Mod     PT G Codes:        Charlotte Crumb, PT DPT  Board Certified Neurologic Specialist [phone]   Fabio Asa 06/10/2017, 9:44 AM

## 2017-06-10 NOTE — Progress Notes (Signed)
Pt doing well on room air, sats 96%.  Family at bedside.

## 2017-06-11 DIAGNOSIS — J9601 Acute respiratory failure with hypoxia: Secondary | ICD-10-CM | POA: Diagnosis present

## 2017-06-11 DIAGNOSIS — Z720 Tobacco use: Secondary | ICD-10-CM | POA: Diagnosis present

## 2017-06-11 DIAGNOSIS — J441 Chronic obstructive pulmonary disease with (acute) exacerbation: Secondary | ICD-10-CM | POA: Diagnosis present

## 2017-06-11 DIAGNOSIS — F419 Anxiety disorder, unspecified: Secondary | ICD-10-CM | POA: Diagnosis present

## 2017-06-11 DIAGNOSIS — J9602 Acute respiratory failure with hypercapnia: Secondary | ICD-10-CM

## 2017-06-11 LAB — BASIC METABOLIC PANEL
Anion gap: 14 (ref 5–15)
BUN: 26 mg/dL — ABNORMAL HIGH (ref 6–20)
CALCIUM: 8.8 mg/dL — AB (ref 8.9–10.3)
CO2: 21 mmol/L — ABNORMAL LOW (ref 22–32)
CREATININE: 1.13 mg/dL (ref 0.61–1.24)
Chloride: 104 mmol/L (ref 101–111)
Glucose, Bld: 104 mg/dL — ABNORMAL HIGH (ref 65–99)
Potassium: 3.8 mmol/L (ref 3.5–5.1)
SODIUM: 139 mmol/L (ref 135–145)

## 2017-06-11 MED ORDER — IPRATROPIUM-ALBUTEROL 0.5-2.5 (3) MG/3ML IN SOLN
3.0000 mL | Freq: Three times a day (TID) | RESPIRATORY_TRACT | Status: DC
Start: 2017-06-11 — End: 2017-06-11

## 2017-06-11 MED ORDER — ALBUTEROL SULFATE (5 MG/ML) 0.5% IN NEBU: 2.5000 mg | INHALATION_SOLUTION | RESPIRATORY_TRACT | 12 refills | Status: DC | PRN

## 2017-06-11 MED ORDER — NICOTINE 14 MG/24HR TD PT24: 14.0000 mg | MEDICATED_PATCH | TRANSDERMAL | 1 refills | Status: DC

## 2017-06-11 MED ORDER — FLUTICASONE PROPIONATE HFA 110 MCG/ACT IN AERO: 2.0000 | INHALATION_SPRAY | Freq: Two times a day (BID) | RESPIRATORY_TRACT | 2 refills | Status: DC

## 2017-06-11 MED ORDER — PREDNISONE 20 MG PO TABS: ORAL_TABLET | ORAL | 0 refills | Status: DC

## 2017-06-11 MED ORDER — CEPHALEXIN 500 MG PO CAPS: 500.0000 mg | ORAL_CAPSULE | Freq: Three times a day (TID) | ORAL | 0 refills | Status: DC

## 2017-06-11 MED ORDER — ALBUTEROL SULFATE (2.5 MG/3ML) 0.083% IN NEBU: 2.5000 mg | INHALATION_SOLUTION | Freq: Three times a day (TID) | RESPIRATORY_TRACT | 3 refills | Status: DC

## 2017-06-11 MED ORDER — MONTELUKAST SODIUM 10 MG PO TABS: 10.0000 mg | ORAL_TABLET | Freq: Every day | ORAL | 2 refills | Status: DC

## 2017-06-11 MED ORDER — ALPRAZOLAM 0.5 MG PO TABS
0.5000 mg | ORAL_TABLET | Freq: Three times a day (TID) | ORAL | 0 refills | Status: DC | PRN
Start: 2017-06-11 — End: 2017-09-20

## 2017-06-11 MED ORDER — IPRATROPIUM-ALBUTEROL 0.5-2.5 (3) MG/3ML IN SOLN: 3.0000 mL | Freq: Three times a day (TID) | RESPIRATORY_TRACT | Status: DC

## 2017-06-11 MED ORDER — ALBUTEROL SULFATE HFA 108 (90 BASE) MCG/ACT IN AERS: 2.0000 | INHALATION_SPRAY | RESPIRATORY_TRACT | 2 refills | Status: DC | PRN

## 2017-06-11 NOTE — Discharge Summary (Signed)
Aaron [name], is a 48 y.o. male  DOB [birth year]  MRN [id number].  Admission date:  06/06/2017  Admitting Physician  Gigi Gin, MD  Discharge Date:  06/11/2017   Primary MD  Bing Neighbors, [name]  Recommendations for primary care physician for things to follow:   Quit Smoking !!!  Admission Diagnosis  Severe persistent asthma with exacerbation [J45.51] Acute respiratory failure with hypoxia (HCC) [J96.01]   Discharge Diagnosis  Severe persistent asthma with exacerbation [J45.51] Acute respiratory failure with hypoxia (HCC) [J96.01]    Principal Problem:   Acute respiratory failure with hypoxia and hypercapnia (HCC) Active Problems:   Asthma exacerbation   COPD with acute exacerbation (HCC)   Tobacco abuse (since age 24- > 30 Pack Years)   Anxiety      Past Medical History:  Diagnosis Date  . Asthma     Past Surgical History:  Procedure Laterality Date  . ABDOMINAL SURGERY    . HERNIA REPAIR       HPI  from the history and physical done on the day of admission:     CHIEF COMPLAINT: Asthma exacerbation; Respiratory failure  HISTORY OF PRESENT ILLNESS:   46yoM with hx of Asthma, presented early this AM to the [facility] ER c/o SOB x 2 days. Denied F/C, CP. Admitted to chronic cough productive of clear sputum but no changes in sputum quality or quantity. In the ER he was given nebulizer treatments and magnesium with no improvement. He was found to be tripoding and became agitated when BIPAP was attempted. Therefore patient was intubated.   At time of my exam patient is intubated but under-sedated, sitting up on stretcher, lifting legs in air, coughing. His fiancee is present in the room who assists with the history. She says he has been stressed out lately as his father just died. She reports he has not run out of his home asthma medications though. She also reports no illicit drug  use. Reports he has not had F/C, Cp, Muscle aches, or changes changes in his chronic cough or sputum.     Hospital Course:    Brief summary:- 47 year old smoker (> 30 Pack years) admitted to [facility] on 06/06/2017 with respiratory distress and acute hypoxic and hypercapnic respiratory failure requiring intubation after poor tolerance of BiPAP.  Extubated 06/09/2017, post ambulation O2 sats on 06/11/2017 is 99-100% on room air.  History is more consistent with COPD exacerbation rather than asthma.  Patient has no prior history of asthma per se, but has more than 30 pack years  Plan:- 1)Acute Hypoxic and Hypercapnic Respiratory Failure- secondary to COPD exacerbation in a patient with ongoing tobacco use, intubated 06/06/2017, extubated 06/09/2017, overall much improved, post ambulation O2 sats today 99-100% on room air, okay to discharge home on prednisone, Keflex and bronchodilators.  Outpatient follow-up with PCP and pulmonologist advised  2)Acute COPD exacerbation-patient has more than 30 pack years, strongly encouraged to quit smoking.  On further interview it appears patient has COPD rather than asthma,  follow-up with PCP and pulmonologist as outpatient for full PFTs and spirometry testing.   3)Tobacco Abuse-patient started smoking at age 62, has more than 30 pack years, smoking cessation strongly advised may use nicotine patch  4)Anxiety-Xanax sparingly as needed, follow-up with PCP for initiation of SSRI, denies significant depressive symptoms denies suicidal ideation or plans  Discharge Condition: stable (ambulating the hallways without hypoxia)  Follow UP- for PFTs   Consults obtained - PCCM   Diet and Activity recommendation:  As advised  Discharge Instructions    Discharge Instructions    Call MD for:   Complete by:  As directed    Call MD for:  difficulty breathing, headache or visual disturbances   Complete by:  As directed    Call MD for:  persistant dizziness or  light-headedness   Complete by:  As directed    Call MD for:  persistant nausea and vomiting   Complete by:  As directed    Call MD for:  temperature >100.4   Complete by:  As directed    Diet general   Complete by:  As directed    Discharge instructions   Complete by:  As directed    1) Quit smoking !!! 2) take medications as prescribed 3) follow-up with pulmonologist for Full PFT/pulmonary function test/spirometry test   Increase activity slowly   Complete by:  As directed        Discharge Medications     Allergies as of 06/11/2017   No Known Allergies     Medication List    STOP taking these medications   loratadine 10 MG tablet Commonly known as:  CLARITIN     TAKE these medications   albuterol 108 (90 Base) MCG/ACT inhaler Commonly known as:  PROVENTIL HFA;VENTOLIN HFA Inhale 1-2 puffs into the lungs every 6 (six) hours as needed for wheezing or shortness of breath. What changed:  Another medication with the same name was changed. Make sure you understand how and when to take each.   albuterol 108 (90 Base) MCG/ACT inhaler Commonly known as:  PROVENTIL HFA;VENTOLIN HFA Inhale 2 puffs into the lungs every 4 (four) hours as needed for wheezing or shortness of breath. What changed:  how much to take   albuterol (2.5 MG/3ML) 0.083% nebulizer solution Commonly known as:  PROVENTIL Take 3 mLs (2.5 mg total) by nebulization 3 (three) times daily. What changed:  when to take this   albuterol (5 MG/ML) 0.5% nebulizer solution Commonly known as:  PROVENTIL Take 0.5 mLs (2.5 mg total) by nebulization every 2 (two) hours as needed for wheezing or shortness of breath. What changed:  when to take this   ALPRAZolam 0.5 MG tablet Commonly known as:  XANAX Take 1 tablet (0.5 mg total) by mouth every 8 (eight) hours as needed for anxiety or sleep.   cephALEXin 500 MG capsule Commonly known as:  KEFLEX Take 1 capsule (500 mg total) by mouth 3 (three) times daily.     fluticasone 110 MCG/ACT inhaler Commonly known as:  FLOVENT HFA Inhale 2 puffs into the lungs 2 (two) times daily. What changed:  Another medication with the same name was removed. Continue taking this medication, and follow the directions you see here.   montelukast 10 MG tablet Commonly known as:  SINGULAIR Take 1 tablet (10 mg total) by mouth at bedtime.   nicotine 14 mg/24hr patch Commonly known as:  NICODERM CQ - dosed in mg/24 hours Place 1 patch (14 mg total) onto  the skin daily. For smoking cessation   predniSONE 20 MG tablet Commonly known as:  DELTASONE Take 2 tablets (40mg ) daily x 5 days, then 1 tablet (20mg ) daily x 4 days then STOP What changed:    medication strength  additional instructions  Another medication with the same name was removed. Continue taking this medication, and follow the directions you see here.       Major procedures and Radiology Reports - PLEASE review detailed and final reports for all details, in brief -   Dg Chest 2 View  Result Date: 06/05/2017 CLINICAL DATA:  Asthma attack.  Shortness of breath. EXAM: CHEST  2 VIEW COMPARISON:  February 23, 2017 FINDINGS: The heart size and mediastinal contours are within normal limits. Both lungs are clear. The visualized skeletal structures are unremarkable. IMPRESSION: No active cardiopulmonary disease. Electronically Signed   By: Gerome Sam III M.D   On: 06/05/2017 23:02   Dg Abdomen 1 View  Result Date: 06/06/2017 CLINICAL DATA:  Orogastric tube placement. EXAM: ABDOMEN - 1 VIEW COMPARISON:  None. FINDINGS: The patient's enteric tube is noted ending overlying the body of the stomach, with the side port about the fundus of the stomach. The visualized bowel gas pattern is grossly unremarkable, with a small amount of stool noted in the colon. Postoperative change is noted along the midline abdominal wall. No acute osseous abnormalities are seen. The visualized lung bases are clear. IMPRESSION: Enteric  tube noted ending overlying the body of the stomach. Electronically Signed   By: Roanna Raider M.D.   On: 06/06/2017 05:17   Dg Chest Port 1 View  Result Date: 06/09/2017 CLINICAL DATA:  Acute respiratory failure, hypoxia EXAM: PORTABLE CHEST 1 VIEW COMPARISON:  06/08/2017 FINDINGS: Right PICC line tip is in the lower right atrium approximately 5.7 cm below the IVC right atrial junction. Endotracheal tube and NG tube are unchanged. Minimal left base atelectasis. Right lung clear. Heart is normal size. IMPRESSION: Left base atelectasis. Right side PICC line tip in the lower right atrium 5.7 cm below the IVC right atrial junction. Electronically Signed   By: Charlett Nose M.D.   On: 06/09/2017 09:27   Dg Chest Port 1 View  Result Date: 06/08/2017 CLINICAL DATA:  Respiratory failure.  Intubated patient. EXAM: PORTABLE CHEST 1 VIEW COMPARISON:  Single-view of the chest 06/08/2017 and 06/07/2017. FINDINGS: Endotracheal tube remains in place with the tip in good position just below the clavicular heads. Side port of the patient's NG tube is seen in the stomach. Lungs clear. No pneumothorax or pleural effusion. Heart size is normal. No acute bony abnormality. IMPRESSION: Support tubes and lines in good position.  Lungs clear. Electronically Signed   By: Drusilla Kanner M.D.   On: 06/08/2017 11:52   Dg Chest Port 1 View  Result Date: 06/08/2017 CLINICAL DATA:  Asthma. EXAM: PORTABLE CHEST 1 VIEW COMPARISON:  Radiograph of June 07, 2017. FINDINGS: The heart size and mediastinal contours are within normal limits. Both lungs are clear. Endotracheal and nasogastric tubes are unchanged in position. Right-sided PICC line is unchanged in position. No pneumothorax or pleural effusion is noted. The visualized skeletal structures are unremarkable. IMPRESSION: Stable support apparatus. No acute cardiopulmonary abnormality seen. Electronically Signed   By: Lupita Raider, M.D.   On: 06/08/2017 09:09   Dg Chest Port 1  View  Result Date: 06/07/2017 CLINICAL DATA:  Respiratory failure EXAM: PORTABLE CHEST 1 VIEW COMPARISON:  06/06/2017 FINDINGS: Cardiac shadow is within normal limits.  Endotracheal tube and nasogastric catheter and right-sided PICC line are noted in satisfactory position. The lungs are well aerated bilaterally. No focal infiltrate or sizable effusion is noted. IMPRESSION: Tubes and lines as described above.  No acute abnormality noted. Electronically Signed   By: Alcide CleverMark  Lukens M.D.   On: 06/07/2017 09:39   Dg Chest Port 1 View  Result Date: 06/06/2017 CLINICAL DATA:  Endotracheal tube placement. EXAM: PORTABLE CHEST 1 VIEW COMPARISON:  Chest radiograph performed 06/05/2017 FINDINGS: The patient's endotracheal tube is seen ending 3 cm above the carina. An enteric tube is noted extending below the diaphragm. The lungs are well-aerated and clear. There is no evidence of focal opacification, pleural effusion or pneumothorax. The cardiomediastinal silhouette is within normal limits. No acute osseous abnormalities are seen. IMPRESSION: 1. Endotracheal tube seen ending 3 cm above the carina. 2. Enteric tube seen extending below the diaphragm. 3. No acute cardiopulmonary process seen. Electronically Signed   By: Roanna RaiderJeffery  Chang M.D.   On: 06/06/2017 05:16    Micro Results   Recent Results (from the past 240 hour(s))  MRSA PCR Screening     Status: None   Collection Time: 06/06/17  8:44 AM  Result Value Ref Range Status   MRSA by PCR NEGATIVE NEGATIVE Final    Comment:        The GeneXpert MRSA Assay (FDA approved for NASAL specimens only), is one component of a comprehensive MRSA colonization surveillance program. It is not intended to diagnose MRSA infection nor to guide or monitor treatment for MRSA infections. Performed at [facility], [address] [place], [place] [address]        Today   Subjective    Eriverto Harrold DonathCrump today has no complaints, states he feels generally weak,  complains of legs feeling weak (has been intubated and bedbound since 06/06/2017 through 06/09/2017),           Patient has been seen and examined prior to discharge   Objective   Blood pressure (!) 144/81, pulse 83, temperature 99.4 F (37.4 C), temperature source Oral, resp. rate 18, height 5\' 10"  (1.778 m), weight 81.9 kg (180 lb 8.9 oz), SpO2 99 %.   Intake/Output Summary (Last 24 hours) at 06/11/2017 1245 Last data filed at 06/11/2017 1015 Gross per 24 hour  Intake 10 ml  Output -  Net 10 ml    Exam Gen:- Awake  In no apparent distress  HEENT:- [place].AT,   Neck-Supple Neck,No JVD,  Lungs- mostly clear , air movement is fairly symmetrical CV- S1, S2 normal Abd-  +ve B.Sounds, Abd Soft, No tenderness,    Extremity/Skin:- Intact peripheral pulses ,negative Homans, no swelling no redness, no warmth Psych-somewhat anxious Neuro-no tremors, no new focal deficits, generalized weakness noted   Data Review   CBC w Diff:  Lab Results  Component Value Date   WBC 15.3 (H) 06/09/2017   HGB 13.5 06/09/2017   HCT 40.9 06/09/2017   PLT 167 06/09/2017   LYMPHOPCT 12 06/06/2017   MONOPCT 7 06/06/2017   EOSPCT 2 06/06/2017   BASOPCT 0 06/06/2017    CMP:  Lab Results  Component Value Date   NA 143 06/09/2017   K 4.8 06/09/2017   CL 108 06/09/2017   CO2 22 06/09/2017   BUN 35 (H) 06/09/2017   CREATININE 1.10 06/09/2017   PROT 6.3 (L) 06/06/2017   ALBUMIN 3.9 06/06/2017   BILITOT 0.4 06/06/2017   ALKPHOS 64 06/06/2017   AST 30 06/06/2017  ALT 28 06/06/2017  .   Total Discharge time is about 33 minutes  Shon Hale M.D on 06/11/2017 at 12:45 PM  [facility]   Office  [phone]  Voice Recognition Reubin Milan dictation system was used to create this note, attempts have been made to correct errors. Please contact the author with questions and/or clarifications.

## 2017-06-11 NOTE — Discharge Instructions (Signed)
1) Quit smoking !!! 2) take medications as prescribed 3) follow-up with pulmonologist for Full PFT/pulmonary function test/spirometry test

## 2017-06-11 NOTE — Progress Notes (Signed)
1556 Patient discharged to home. Verbalizes understanding of all discharge instructions including discharge medications and follow up MD visits. Patient accompanied by family.

## 2017-06-15 ENCOUNTER — Ambulatory Visit (HOSPITAL_COMMUNITY)
Admission: RE | Admit: 2017-06-15 | Discharge: 2017-06-15 | Disposition: A | Discharge status: Home / Self Care | Payer: Self-pay | Source: Ambulatory Visit | Attending: Family Medicine | Admitting: Family Medicine

## 2017-06-15 ENCOUNTER — Encounter: Payer: Self-pay | Admitting: Family Medicine

## 2017-06-15 ENCOUNTER — Ambulatory Visit (INDEPENDENT_AMBULATORY_CARE_PROVIDER_SITE_OTHER): Payer: Self-pay | Admitting: Family Medicine

## 2017-06-15 ENCOUNTER — Telehealth: Payer: Self-pay

## 2017-06-15 ENCOUNTER — Telehealth: Payer: Self-pay | Admitting: Family Medicine

## 2017-06-15 VITALS — BP 118/72 | HR 95 | Temp 98.4°F | Resp 14 | Ht 70.0 in | Wt 160.6 lb

## 2017-06-15 DIAGNOSIS — J449 Chronic obstructive pulmonary disease, unspecified: Secondary | ICD-10-CM

## 2017-06-15 DIAGNOSIS — R7989 Other specified abnormal findings of blood chemistry: Secondary | ICD-10-CM

## 2017-06-15 DIAGNOSIS — R197 Diarrhea, unspecified: Secondary | ICD-10-CM

## 2017-06-15 DIAGNOSIS — R443 Hallucinations, unspecified: Secondary | ICD-10-CM

## 2017-06-15 DIAGNOSIS — F17200 Nicotine dependence, unspecified, uncomplicated: Secondary | ICD-10-CM

## 2017-06-15 MED ORDER — SACCHAROMYCES BOULARDII 250 MG PO CAPS: 250.0000 mg | ORAL_CAPSULE | Freq: Two times a day (BID) | ORAL | 0 refills | Status: AC

## 2017-06-15 MED ORDER — DOXYCYCLINE HYCLATE 100 MG PO TABS: 100.0000 mg | ORAL_TABLET | Freq: Two times a day (BID) | ORAL | 0 refills | Status: AC

## 2017-06-15 NOTE — Telephone Encounter (Signed)
Aaron [name], a 47 year old male with a history of COPD presented complaining of possible allergic reaction after starting Keflex. Patient experienced diarrhea and vomiting 1 day after starting medication. His partner also stated that patient experienced visual hallucinations. Symptoms improved after discontinuing medication. Started a course of Doxycycline 100 mg BID for 7 days.   Patient was sent for chest xray to check for resolution of COPD exacerbation. Previous chest xray showed atelectasis. Reviewed chest xray today, unremarkable. Patient will follow up in office as scheduled with Joaquin CourtsKimberly Harris, [name].   Nolon NationsLachina Moore Mirtie Bastyr  MSN, [name]-C Patient Care [facility] [address]  [place], [place] [address] [phone]

## 2017-06-15 NOTE — Patient Instructions (Addendum)
We will continue prednisone as prescribed.  Please take medication until completion.    Will discontinue Keflex as prescribed.  We will repeat chest x-ray today to ensure that COPD exacerbation has resolved. Will start a trial of Doxycycline 100 mg BID for 7 days.  For diarrhea recommend probiotics as directed to restore normal GI flora    Refrain from taking Xanax due to hallucinations   Food Choices to Help Relieve Diarrhea, Adult When you have diarrhea, the foods you eat and your eating habits are very important. Choosing the right foods and drinks can help:  Relieve diarrhea.  Replace lost fluids and nutrients.  Prevent dehydration.  What general guidelines should I follow? Relieving diarrhea  Choose foods with less than 2 g or .07 oz. of fiber per serving.  Limit fats to less than 8 tsp (38 g or 1.34 oz.) a day.  Avoid the following: ? Foods and beverages sweetened with high-fructose corn syrup, honey, or sugar alcohols such as xylitol, sorbitol, and mannitol. ? Foods that contain a lot of fat or sugar. ? Fried, greasy, or spicy foods. ? High-fiber grains, breads, and cereals. ? Raw fruits and vegetables.  Eat foods that are rich in probiotics. These foods include dairy products such as yogurt and fermented milk products. They help increase healthy bacteria in the stomach and intestines (gastrointestinal tract, or GI tract).  If you have lactose intolerance, avoid dairy products. These may make your diarrhea worse.  Take medicine to help stop diarrhea (antidiarrheal medicine) only as told by your health care provider. Replacing nutrients  Eat small meals or snacks every 3-4 hours.  Eat bland foods, such as white rice, toast, or baked potato, until your diarrhea starts to get better. Gradually reintroduce nutrient-rich foods as tolerated or as told by your health care provider. This includes: ? Well-cooked protein foods. ? Peeled, seeded, and soft-cooked fruits and  vegetables. ? Low-fat dairy products.  Take vitamin and mineral supplements as told by your health care provider. Preventing dehydration   Start by sipping water or a special solution to prevent dehydration (oral rehydration solution, ORS). Urine that is clear or pale yellow means that you are getting enough fluid.  Try to drink at least 8-10 cups of fluid each day to help replace lost fluids.  You may add other liquids in addition to water, such as clear juice or decaffeinated sports drinks, as tolerated or as told by your health care provider.  Avoid drinks with caffeine, such as coffee, tea, or soft drinks.  Avoid alcohol. What foods are recommended? The items listed may not be a complete list. Talk with your health care provider about what dietary choices are best for you. Grains White rice. White, [place], or pita breads (fresh or toasted), including plain rolls, buns, or bagels. White pasta. Saltine, soda, or graham crackers. Pretzels. Low-fiber cereal. Cooked cereals made with water (such as cornmeal, farina, or cream cereals). Plain muffins. Matzo. Melba toast. Zwieback. Vegetables Potatoes (without the skin). Most well-cooked and canned vegetables without skins or seeds. Tender lettuce. Fruits Apple sauce. Fruits canned in juice. Cooked apricots, cherries, grapefruit, peaches, pears, or plums. Fresh bananas and cantaloupe. Meats and other protein foods Baked or boiled chicken. Eggs. Tofu. Fish. Seafood. Smooth nut butters. Ground or well-cooked tender beef, ham, veal, lamb, pork, or poultry. Dairy Plain yogurt, kefir, and unsweetened liquid yogurt. Lactose-free milk, buttermilk, skim milk, or soy milk. Low-fat or nonfat hard cheese. Beverages Water. Low-calorie sports drinks. Fruit juices without  pulp. Strained tomato and vegetable juices. Decaffeinated teas. Sugar-free beverages not sweetened with sugar alcohols. Oral rehydration solutions, if approved by your health care  provider. Seasoning and other foods Bouillon, broth, or soups made from recommended foods. What foods are not recommended? The items listed may not be a complete list. Talk with your health care provider about what dietary choices are best for you. Grains Whole grain, whole wheat, bran, or rye breads, rolls, pastas, and crackers. Wild or brown rice. Whole grain or bran cereals. Barley. Oats and oatmeal. Corn tortillas or taco shells. Granola. Popcorn. Vegetables Raw vegetables. Fried vegetables. Cabbage, broccoli, Brussels sprouts, artichokes, baked beans, beet greens, corn, kale, legumes, peas, sweet potatoes, and yams. Potato skins. Cooked spinach and cabbage. Fruits Dried fruit, including raisins and dates. Raw fruits. Stewed or dried prunes. Canned fruits with syrup. Meat and other protein foods Fried or fatty meats. Deli meats. Chunky nut butters. Nuts and seeds. Beans and lentils. Tomasa BlaseBacon. Hot dogs. Sausage. Dairy High-fat cheeses. Whole milk, chocolate milk, and beverages made with milk, such as milk shakes. Half-and-half. Cream. sour cream. Ice cream. Beverages Caffeinated beverages (such as coffee, tea, soda, or energy drinks). Alcoholic beverages. Fruit juices with pulp. Prune juice. Soft drinks sweetened with high-fructose corn syrup or sugar alcohols. High-calorie sports drinks. Fats and oils Butter. Cream sauces. Margarine. Salad oils. Plain salad dressings. Olives. Avocados. Mayonnaise. Sweets and desserts Sweet rolls, doughnuts, and sweet breads. Sugar-free desserts sweetened with sugar alcohols such as xylitol and sorbitol. Seasoning and other foods Honey. Hot sauce. Chili powder. Gravy. Cream-based or milk-based soups. Pancakes and waffles. Summary  When you have diarrhea, the foods you eat and your eating habits are very important.  Make sure you get at least 8-10 cups of fluid each day, or enough to keep your urine clear or pale yellow.  Eat bland foods and gradually  reintroduce healthy, nutrient-rich foods as tolerated, or as told by your health care provider.  Avoid high-fiber, fried, greasy, or spicy foods. This information is not intended to replace advice given to you by your health care provider. Make sure you discuss any questions you have with your health care provider. Document Released: 07/09/2003 Document Revised: 04/15/2016 Document Reviewed: 04/15/2016 Elsevier Interactive Patient Education  2018 [employer]  Follow up with Joaquin CourtsKimberly Harris, [name] in 1 week.

## 2017-06-15 NOTE — Progress Notes (Signed)
Subjective:    Patient ID: Aaron [name], male    DOB: 1970/05/20, 47 y.o.   MRN: [id number]  HPI Aaron [name], a 47 year old male with a history of COPD and mild intermittent asthma, and tobacco dependence presents complaining of possible allergic reaction after taking Keflex.    Patient was admitted to inpatient services on 06/06/2017 for a COPD exacerbation.  Patient presented to the emergency room after experiencing shortness of breath for 2 days.  He states that exacerbation was triggered by extreme stress. His father passed away and funeral was 3 days prior to inpatient admission.   In the emergency room patient was given nebulizer treatments and magnesium without improvement.  Patient became extremely agitated when BiPAP was attempted.  Patient experienced hypoxia which led to intubation.  Patient was extubated on 06/09/2017.  He was discharged home on prednisone, Keflex, and albuterol inhaler.  Also, patient was anxious with shortness of breath and was discharged home on Xanax 0.25 mg BID as needed.    Patient experienced diarrhea, vomiting, and rash after starting Keflex. Patient has been taking Keflex 3 times a day since hospital discharge.  Patient stopped medication immediately. Symptoms have subsided since starting medication.   Past Medical History:  Diagnosis Date  . Asthma    Social History   Socioeconomic History  . Marital status: Married    Spouse name: Not on file  . Number of children: Not on file  . Years of education: Not on file  . Highest education level: Not on file  Social Needs  . Financial resource strain: Not on file  . Food insecurity - worry: Not on file  . Food insecurity - inability: Not on file  . Transportation needs - medical: Not on file  . Transportation needs - non-medical: Not on file  Occupational History  . Not on file  Tobacco Use  . Smoking status: Current Every Day Smoker    Packs/day: 0.50    Types: Cigarettes  . Smokeless tobacco:  Never Used  Substance and Sexual Activity  . Alcohol use: Yes    Comment: occasion  . Drug use: Yes    Types: Marijuana  . Sexual activity: Yes  Other Topics Concern  . Not on file  Social History Narrative   ** Merged History Encounter **       Immunization History  Administered Date(s) Administered  . Influenza,inj,Quad PF,6+ Mos 02/25/2017  . Pneumococcal Polysaccharide-23 02/25/2017  . Tdap 03/15/2017      Review of Systems  Constitutional: Positive for fatigue. Negative for fever.  HENT: Positive for congestion.   Eyes: Negative.   Respiratory: Positive for cough, shortness of breath and wheezing (occasional). Negative for chest tightness.   Cardiovascular: Negative.  Negative for chest pain and leg swelling.  Gastrointestinal: Positive for diarrhea and vomiting.  Endocrine: Negative for polydipsia, polyphagia and polyuria.  Genitourinary: Negative.   Musculoskeletal: Negative.   Skin: Positive for rash.  Allergic/Immunologic: Negative.   Neurological: Negative.   Hematological: Negative.   Psychiatric/Behavioral: Negative.        Objective:   Physical Exam  Constitutional: He appears well-developed and well-nourished.  HENT:  Head: Normocephalic and atraumatic.  Right Ear: External ear normal.  Left Ear: External ear normal.  Mouth/Throat: Oropharynx is clear and moist.  Eyes: Pupils are equal, round, and reactive to light.  Neck: Normal range of motion. Neck supple.  Pulmonary/Chest: He has decreased breath sounds in the right lower field and the left lower field.  He has no wheezes. He has no rhonchi. He has rales.  Abdominal: Soft. Bowel sounds are normal.         BP 118/72 (BP Location: Right Arm, Patient Position: Sitting, Cuff Size: Large)   Pulse 95   Temp 98.4 F (36.9 C) (Oral)   Resp 14   Ht 5\' 10"  (1.778 m)   Wt 160 lb 9.6 oz (72.8 kg)   SpO2 98%   BMI 23.04 kg/m   Assessment & Plan:  1. Chronic obstructive pulmonary disease,  unspecified COPD type (HCC) Reviewed previous chest xray, atelectasis present. Will repeat chest xray to ensure resolution of exacerbation.  Stop Keflex.  Will start a trial of doxycycline 100 mg BID for 7 days.  Continue Prednisone as prescribed at hospital discharge.  - DG Chest 2 View; Future - doxycycline (VIBRA-TABS) 100 MG tablet; Take 1 tablet (100 mg total) by mouth 2 (two) times daily for 7 days.  Dispense: 20 tablet; Refill: 0  2. Abnormal TSH 10 days ago TSH decreased, will repeat on today.  - TSH  3. Diarrhea, unspecified type Patient says that diarrhea has improved slightly since discontinuing Keflex. Will add probiotic to medication regimen for 7 days.   - saccharomyces boulardii (FLORASTOR) 250 MG capsule; Take 1 capsule (250 mg total) by mouth 2 (two) times daily for 7 days.  Dispense: 14 capsule; Refill: 0 - Basic Metabolic Panel - CBC  4. Hallucinations Patient experienced hallucinations after taking Xanax. Advised to discontinue xanax.   5. Tobacco dependence Smoking cessation instruction/counseling given:  counseled patient on the dangers of tobacco use, advised patient to stop smoking, and reviewed strategies to maximize success    RTC: Follow up in 1 week with Joaquin Courts, [name]    The patient was given clear instructions to go to ER or return to medical center if symptoms do not improve, worsen or new problems develop. The patient verbalized understanding.    Nolon Nations  MSN, [name]-C [facility] [address] [place], [place] [address] [phone]

## 2017-06-16 LAB — BASIC METABOLIC PANEL
BUN / CREAT RATIO: 19 (ref 9–20)
BUN: 26 mg/dL — AB (ref 6–24)
CALCIUM: 9.9 mg/dL (ref 8.7–10.2)
CHLORIDE: 94 mmol/L — AB (ref 96–106)
CO2: 25 mmol/L (ref 20–29)
CREATININE: 1.36 mg/dL — AB (ref 0.76–1.27)
GFR calc Af Amer: 72 mL/min/{1.73_m2} (ref >59)
GFR calc non Af Amer: 62 mL/min/{1.73_m2} (ref >59)
GLUCOSE: 99 mg/dL (ref 65–99)
Potassium: 4.1 mmol/L (ref 3.5–5.2)
Sodium: 138 mmol/L (ref 134–144)

## 2017-06-16 LAB — CBC
HEMATOCRIT: 45.7 % (ref 37.5–51.0)
Hemoglobin: 15.2 g/dL (ref 13.0–17.7)
MCH: 30.6 pg (ref 26.6–33.0)
MCHC: 33.3 g/dL (ref 31.5–35.7)
MCV: 92 fL (ref 79–97)
PLATELETS: 273 10*3/uL (ref 150–379)
RBC: 4.96 x10E6/uL (ref 4.14–5.80)
RDW: 14.2 % (ref 12.3–15.4)
WBC: 8.6 10*3/uL (ref 3.4–10.8)

## 2017-06-16 LAB — TSH: TSH: 1.95 u[IU]/mL (ref 0.450–4.500)

## 2017-06-22 NOTE — Telephone Encounter (Signed)
Medication refill

## 2017-06-23 ENCOUNTER — Encounter: Payer: Self-pay | Admitting: Family Medicine

## 2017-06-23 ENCOUNTER — Ambulatory Visit (INDEPENDENT_AMBULATORY_CARE_PROVIDER_SITE_OTHER): Payer: Self-pay | Admitting: Family Medicine

## 2017-06-23 VITALS — BP 126/84 | HR 100 | Temp 98.1°F | Resp 16 | Ht 70.0 in | Wt 168.0 lb

## 2017-06-23 DIAGNOSIS — J45909 Unspecified asthma, uncomplicated: Secondary | ICD-10-CM

## 2017-06-23 DIAGNOSIS — R197 Diarrhea, unspecified: Secondary | ICD-10-CM

## 2017-06-23 MED ORDER — DICYCLOMINE HCL 10 MG PO CAPS: 10.0000 mg | ORAL_CAPSULE | Freq: Three times a day (TID) | ORAL | 0 refills | Status: DC

## 2017-06-23 NOTE — Progress Notes (Signed)
Patient ID: Aaron [name], male    DOB: 09-Mar-1971, 47 y.o.   MRN: [id number]  PCP: Bing Neighbors, [name]  Chief Complaint  Patient presents with  . Follow-up    1 week on asthma     Subjective:  HPI Aaron [name] is a 47 y.o. male with chronic asthma presents for evaluation of asthma and diarrhea. Jarel suffers from persistent severe asthma. He was recently hospitalized for severe asthma exacerbation after two day history of worsening shortness of breath. He developed respiratory failure and subsequently had to be intubated.  Extubated on 06/09/2017 and two days later oxygen saturation return to normal baseline. He was treated for anxiety during hospitalization with Xanax which today he reports anxiety has resolved and he is no longer taking medication. He has remained free of asthma symptoms since hospital discharge and has infrequently used his inhaler since that time. Reports 1-2 nebulizer treatments since discharge. He continues to smoke chronically daily. Complains today of 1 week history of loose stools. No known sick contacts. Reports no associated abdominal pain, chills, or fever. Diarrhea occurs with intake of food or beverage. Recent antibiotic therapy administered during hospitalization.  Social History   Socioeconomic History  . Marital status: Married    Spouse name: Not on file  . Number of children: Not on file  . Years of education: Not on file  . Highest education level: Not on file  Social Needs  . Financial resource strain: Not on file  . Food insecurity - worry: Not on file  . Food insecurity - inability: Not on file  . Transportation needs - medical: Not on file  . Transportation needs - non-medical: Not on file  Occupational History  . Not on file  Tobacco Use  . Smoking status: Current Every Day Smoker    Packs/day: 0.50    Types: Cigarettes  . Smokeless tobacco: Never Used  Substance and Sexual Activity  . Alcohol use: Yes    Comment: occasion  .  Drug use: Yes    Types: Marijuana  . Sexual activity: Yes  Other Topics Concern  . Not on file  Social History Narrative   ** Merged History Encounter **        Family History  Problem Relation Age of Onset  . Diabetes Mother   . Hypertension Father    Review of Systems Pertinent negatives listed in HPI  Patient Active Problem List   Diagnosis Date Noted  . COPD with acute exacerbation (HCC) 06/11/2017  . Acute respiratory failure with hypoxia and hypercapnia (HCC) 06/11/2017  . Tobacco abuse (since age 56- > 5 Pack Years) 06/11/2017  . Anxiety 06/11/2017  . Asthma exacerbation 06/06/2017  . Acute on chronic respiratory failure with hypoxia (HCC) 02/23/2017    Allergies  Allergen Reactions  . Keflex [Cephalexin] Diarrhea, Nausea And Vomiting and Rash    Prior to Admission medications   Medication Sig Start Date End Date Taking? Authorizing Provider  ALPRAZolam Prudy Feeler) 0.5 MG tablet Take 1 tablet (0.5 mg total) by mouth every 8 (eight) hours as needed for anxiety or sleep. 06/11/17 06/11/18 Yes Emokpae, Courage, MD  predniSONE (DELTASONE) 20 MG tablet Take 2 tablets (40mg ) daily x 5 days, then 1 tablet (20mg ) daily x 4 days then STOP 06/11/17  Yes Emokpae, Courage, MD  albuterol (PROVENTIL HFA;VENTOLIN HFA) 108 (90 Base) MCG/ACT inhaler Inhale 1-2 puffs into the lungs every 6 (six) hours as needed for wheezing or shortness of breath. Patient not  taking: Reported on 06/23/2017 02/01/17   Liberty Handy, PA-C  albuterol (PROVENTIL HFA;VENTOLIN HFA) 108 (90 Base) MCG/ACT inhaler Inhale 2 puffs into the lungs every 4 (four) hours as needed for wheezing or shortness of breath. Patient not taking: Reported on 06/15/2017 06/11/17   Shon Hale, MD  albuterol (PROVENTIL) (2.5 MG/3ML) 0.083% nebulizer solution Take 3 mLs (2.5 mg total) by nebulization 3 (three) times daily. Patient not taking: Reported on 06/23/2017 06/11/17   Shon Hale, MD  albuterol (PROVENTIL) (5 MG/ML)  0.5% nebulizer solution Take 0.5 mLs (2.5 mg total) by nebulization every 2 (two) hours as needed for wheezing or shortness of breath. Patient not taking: Reported on 06/23/2017 06/11/17   Shon Hale, MD  dicyclomine (BENTYL) 10 MG capsule Take 1 capsule (10 mg total) by mouth 4 (four) times daily -  before meals and at bedtime. 06/23/17 07/23/17  Bing Neighbors, [name]  fluticasone (FLOVENT HFA) 110 MCG/ACT inhaler Inhale 2 puffs into the lungs 2 (two) times daily. Patient not taking: Reported on 06/15/2017 06/11/17   Shon Hale, MD  montelukast (SINGULAIR) 10 MG tablet Take 1 tablet (10 mg total) by mouth at bedtime. Patient not taking: Reported on 06/15/2017 06/11/17 07/11/17  Shon Hale, MD  nicotine (NICODERM CQ - DOSED IN MG/24 HOURS) 14 mg/24hr patch Place 1 patch (14 mg total) onto the skin daily. For smoking cessation Patient not taking: Reported on 06/15/2017 06/11/17 06/11/18  Shon Hale, MD    Past Medical, Surgical Family and Social History reviewed and updated.    Objective:   Today's Vitals   06/23/17 1038  BP: 126/84  Pulse: 100  Resp: 16  Temp: 98.1 F (36.7 C)  TempSrc: Oral  SpO2: 99%  Weight: 168 lb (76.2 kg)  Height: 5\' 10"  (1.778 m)    Wt Readings from Last 3 Encounters:  06/23/17 168 lb (76.2 kg)  06/15/17 160 lb 9.6 oz (72.8 kg)  06/10/17 180 lb 8.9 oz (81.9 kg)    Physical Exam  Constitutional: He is oriented to person, place, and time. He appears well-developed.  HENT:  Head: Normocephalic and atraumatic.  Cardiovascular: Normal rate, normal heart sounds and intact distal pulses.  Pulmonary/Chest: Effort normal and breath sounds normal.  Musculoskeletal: Normal range of motion.  Neurological: He is alert and oriented to person, place, and time.  Skin: Skin is warm and dry.  Psychiatric: He has a normal mood and affect. His behavior is normal. Judgment and thought content normal.    Assessment & Plan:  1. Diarrhea, unspecified type,  concern for possible GI pathogen. Will order a complete GI pathogen panel with C-difficile  Will trial dicyclomine (BENTYL) 10 MG capsule; Take 1 capsule (10 mg total) by mouth 4 (four) times daily -  before meals and at bedtime. Encouraged good hand hygiene. Return with stool specimen at earliest convenience.  2. Asthma, unspecified asthma severity, unspecified whether complicated, unspecified whether persistent, symptoms well controlled today. Encouraged patient to use maintenance inhaler daily regardless of symptoms. Reinforced the importance of smoking cessation-patient is not ready.   3. Anxiety, resolved.   4. Current Daily Smoker, disussed the harms of smoking and association of worsening asthma symptoms and frequent development of asthma exacerbations.     Meds ordered this encounter  Medications  . dicyclomine (BENTYL) 10 MG capsule    Sig: Take 1 capsule (10 mg total) by mouth 4 (four) times daily -  before meals and at bedtime.    Dispense:  120 capsule  Refill:  0    Orders Placed This Encounter  Procedures  . Ova and parasite examination  . Clostridium Difficile by PCR  . GI Profile, Stool, PCR     Godfrey PickKimberly S. Tiburcio PeaHarris, MSN, [name]-C [facility]  [address] [place], [place] [address] [phone]

## 2017-06-23 NOTE — Patient Instructions (Addendum)
Prednisone tomorrow start 2 tablets( 20 mg) twice daily x 5 days . Then stop prednisone. You will have medication remains discard.  Please take the Bentyl as prescribed, and if needed for greater than month please call and follow up in office.   Diarrhea, Adult Diarrhea is when you have loose and water poop (stool) often. Diarrhea can make you feel weak and cause you to get dehydrated. Dehydration can make you tired and thirsty, make you have a dry mouth, and make it so you pee (urinate) less often. Diarrhea often lasts 2-3 days. However, it can last longer if it is a sign of something more serious. It is important to treat your diarrhea as told by your doctor. Follow these instructions at home: Eating and drinking  Follow these recommendations as told by your doctor:  Take an oral rehydration solution (ORS). This is a drink that is sold at pharmacies and stores.  Drink clear fluids, such as: ? Water. ? Ice chips. ? Diluted fruit juice. ? Low-calorie sports drinks.  Eat bland, easy-to-digest foods in small amounts as you are able. These foods include: ? Bananas. ? Applesauce. ? Rice. ? Low-fat (lean) meats. ? Toast. ? Crackers.  Avoid drinking fluids that have a lot of sugar or caffeine in them.  Avoid alcohol.  Avoid spicy or fatty foods.  General instructions   Drink enough fluid to keep your pee (urine) clear or pale yellow.  Wash your hands often. If you cannot use soap and water, use hand sanitizer.  Make sure that all people in your home wash their hands well and often.  Take over-the-counter and prescription medicines only as told by your doctor.  Rest at home while you get better.  Watch your condition for any changes.  Take a warm bath to help with any burning or pain from having diarrhea.  Keep all follow-up visits as told by your doctor. This is important. Contact a doctor if:  You have a fever.  Your diarrhea gets worse.  You have new  symptoms.  You cannot keep fluids down.  You feel light-headed or dizzy.  You have a headache.  You have muscle cramps. Get help right away if:  You have chest pain.  You feel very weak or you pass out (faint).  You have bloody or black poop or poop that look like tar.  You have very bad pain, cramping, or bloating in your belly (abdomen).  You have trouble breathing or you are breathing very quickly.  Your heart is beating very quickly.  Your skin feels cold and clammy.  You feel confused.  You have signs of dehydration, such as: ? Dark pee, hardly any pee, or no pee. ? Cracked lips. ? Dry mouth. ? Sunken eyes. ? Sleepiness. ? Weakness. This information is not intended to replace advice given to you by your health care provider. Make sure you discuss any questions you have with your health care provider. Document Released: 10/05/2007 Document Revised: 11/06/2015 Document Reviewed: 12/23/2014 Elsevier Interactive Patient Education  2018 [employer]   Food Choices to Help Relieve Diarrhea, Adult When you have diarrhea, the foods you eat and your eating habits are very important. Choosing the right foods and drinks can help:  Relieve diarrhea.  Replace lost fluids and nutrients.  Prevent dehydration.  What general guidelines should I follow? Relieving diarrhea  Choose foods with less than 2 g or .07 oz. of fiber per serving.  Limit fats to less than 8 tsp (  38 g or 1.34 oz.) a day.  Avoid the following: ? Foods and beverages sweetened with high-fructose corn syrup, honey, or sugar alcohols such as xylitol, sorbitol, and mannitol. ? Foods that contain a lot of fat or sugar. ? Fried, greasy, or spicy foods. ? High-fiber grains, breads, and cereals. ? Raw fruits and vegetables.  Eat foods that are rich in probiotics. These foods include dairy products such as yogurt and fermented milk products. They help increase healthy bacteria in the stomach and  intestines (gastrointestinal tract, or GI tract).  If you have lactose intolerance, avoid dairy products. These may make your diarrhea worse.  Take medicine to help stop diarrhea (antidiarrheal medicine) only as told by your health care provider. Replacing nutrients  Eat small meals or snacks every 3-4 hours.  Eat bland foods, such as white rice, toast, or baked potato, until your diarrhea starts to get better. Gradually reintroduce nutrient-rich foods as tolerated or as told by your health care provider. This includes: ? Well-cooked protein foods. ? Peeled, seeded, and soft-cooked fruits and vegetables. ? Low-fat dairy products.  Take vitamin and mineral supplements as told by your health care provider. Preventing dehydration   Start by sipping water or a special solution to prevent dehydration (oral rehydration solution, ORS). Urine that is clear or pale yellow means that you are getting enough fluid.  Try to drink at least 8-10 cups of fluid each day to help replace lost fluids.  You may add other liquids in addition to water, such as clear juice or decaffeinated sports drinks, as tolerated or as told by your health care provider.  Avoid drinks with caffeine, such as coffee, tea, or soft drinks.  Avoid alcohol. What foods are recommended? The items listed may not be a complete list. Talk with your health care provider about what dietary choices are best for you. Grains White rice. White, [place], or pita breads (fresh or toasted), including plain rolls, buns, or bagels. White pasta. Saltine, soda, or graham crackers. Pretzels. Low-fiber cereal. Cooked cereals made with water (such as cornmeal, farina, or cream cereals). Plain muffins. Matzo. Melba toast. Zwieback. Vegetables Potatoes (without the skin). Most well-cooked and canned vegetables without skins or seeds. Tender lettuce. Fruits Apple sauce. Fruits canned in juice. Cooked apricots, cherries, grapefruit, peaches, pears, or  plums. Fresh bananas and cantaloupe. Meats and other protein foods Baked or boiled chicken. Eggs. Tofu. Fish. Seafood. Smooth nut butters. Ground or well-cooked tender beef, ham, veal, lamb, pork, or poultry. Dairy Plain yogurt, kefir, and unsweetened liquid yogurt. Lactose-free milk, buttermilk, skim milk, or soy milk. Low-fat or nonfat hard cheese. Beverages Water. Low-calorie sports drinks. Fruit juices without pulp. Strained tomato and vegetable juices. Decaffeinated teas. Sugar-free beverages not sweetened with sugar alcohols. Oral rehydration solutions, if approved by your health care provider. Seasoning and other foods Bouillon, broth, or soups made from recommended foods. What foods are not recommended? The items listed may not be a complete list. Talk with your health care provider about what dietary choices are best for you. Grains Whole grain, whole wheat, bran, or rye breads, rolls, pastas, and crackers. Wild or brown rice. Whole grain or bran cereals. Barley. Oats and oatmeal. Corn tortillas or taco shells. Granola. Popcorn. Vegetables Raw vegetables. Fried vegetables. Cabbage, broccoli, Brussels sprouts, artichokes, baked beans, beet greens, corn, kale, legumes, peas, sweet potatoes, and yams. Potato skins. Cooked spinach and cabbage. Fruits Dried fruit, including raisins and dates. Raw fruits. Stewed or dried prunes. Canned fruits with syrup.  Meat and other protein foods Fried or fatty meats. Deli meats. Chunky nut butters. Nuts and seeds. Beans and lentils. Tomasa Blase. Hot dogs. Sausage. Dairy High-fat cheeses. Whole milk, chocolate milk, and beverages made with milk, such as milk shakes. Half-and-half. Cream. sour cream. Ice cream. Beverages Caffeinated beverages (such as coffee, tea, soda, or energy drinks). Alcoholic beverages. Fruit juices with pulp. Prune juice. Soft drinks sweetened with high-fructose corn syrup or sugar alcohols. High-calorie sports drinks. Fats and  oils Butter. Cream sauces. Margarine. Salad oils. Plain salad dressings. Olives. Avocados. Mayonnaise. Sweets and desserts Sweet rolls, doughnuts, and sweet breads. Sugar-free desserts sweetened with sugar alcohols such as xylitol and sorbitol. Seasoning and other foods Honey. Hot sauce. Chili powder. Gravy. Cream-based or milk-based soups. Pancakes and waffles. Summary  When you have diarrhea, the foods you eat and your eating habits are very important.  Make sure you get at least 8-10 cups of fluid each day, or enough to keep your urine clear or pale yellow.  Eat bland foods and gradually reintroduce healthy, nutrient-rich foods as tolerated, or as told by your health care provider.  Avoid high-fiber, fried, greasy, or spicy foods. This information is not intended to replace advice given to you by your health care provider. Make sure you discuss any questions you have with your health care provider. Document Released: 07/09/2003 Document Revised: 04/15/2016 Document Reviewed: 04/15/2016 Elsevier Interactive Patient Education  2018 Elsevier Inc. Irritable Bowel Syndrome, Adult Irritable bowel syndrome (IBS) is not one specific disease. It is a group of symptoms that affects the organs responsible for digestion (gastrointestinal or GI tract). To regulate how your GI tract works, your body sends signals back and forth between your intestines and your brain. If you have IBS, there may be a problem with these signals. As a result, your GI tract does not function normally. Your intestines may become more sensitive and overreact to certain things. This is especially true when you eat certain foods or when you are under stress. There are four types of IBS. These may be determined based on the consistency of your stool:  IBS with diarrhea.  IBS with constipation.  Mixed IBS.  Unsubtyped IBS.  It is important to know which type of IBS you have. Some treatments are more likely to be helpful  for certain types of IBS. What are the causes? The exact cause of IBS is not known. What increases the risk? You may have a higher risk of IBS if:  You are a woman.  You are younger than 46 years old.  You have a family history of IBS.  You have mental health problems.  You have had bacterial infection of your GI tract.  What are the signs or symptoms? Symptoms of IBS vary from person to person. The main symptom is abdominal pain or discomfort. Additional symptoms usually include one or more of the following:  Diarrhea, constipation, or both.  Abdominal swelling or bloating.  Feeling full or sick after eating a small or regular-size meal.  Frequent gas.  Mucus in the stool.  A feeling of having more stool left after a bowel movement.  Symptoms tend to come and go. They may be associated with stress, psychiatric conditions, or nothing at all. How is this diagnosed? There is no specific test to diagnose IBS. Your health care provider will make a diagnosis based on a physical exam, medical history, and your symptoms. You may have other tests to rule out other conditions that may be causing  your symptoms. These may include:  Blood tests.  X-rays.  CT scan.  Endoscopy and colonoscopy. This is a test in which your GI tract is viewed with a long, thin, flexible tube.  How is this treated? There is no cure for IBS, but treatment can help relieve symptoms. IBS treatment often includes:  Changes to your diet, such as: ? Eating more fiber. ? Avoiding foods that cause symptoms. ? Drinking more water. ? Eating regular, medium-sized portioned meals.  Medicines. These may include: ? Fiber supplements if you have constipation. ? Medicine to control diarrhea (antidiarrheal medicines). ? Medicine to help control muscle spasms in your GI tract (antispasmodic medicines). ? Medicines to help with any mental health issues, such as antidepressants or  tranquilizers.  Therapy. ? Talk therapy may help with anxiety, depression, or other mental health issues that can make IBS symptoms worse.  Stress reduction. ? Managing your stress can help keep symptoms under control.  Follow these instructions at home:  Take medicines only as directed by your health care provider.  Eat a healthy diet. ? Avoid foods and drinks with added sugar. ? Include more whole grains, fruits, and vegetables gradually into your diet. This may be especially helpful if you have IBS with constipation. ? Avoid any foods and drinks that make your symptoms worse. These may include dairy products and caffeinated or carbonated drinks. ? Do not eat large meals. ? Drink enough fluid to keep your urine clear or pale yellow.  Exercise regularly. Ask your health care provider for recommendations of good activities for you.  Keep all follow-up visits as directed by your health care provider. This is important. Contact a health care provider if:  You have constant pain.  You have trouble or pain with swallowing.  You have worsening diarrhea. Get help right away if:  You have severe and worsening abdominal pain.  You have diarrhea and: ? You have a rash, stiff neck, or severe headache. ? You are irritable, sleepy, or difficult to awaken. ? You are weak, dizzy, or extremely thirsty.  You have bright red blood in your stool or you have black tarry stools.  You have unusual abdominal swelling that is painful.  You vomit continuously.  You vomit blood (hematemesis).  You have both abdominal pain and a fever. This information is not intended to replace advice given to you by your health care provider. Make sure you discuss any questions you have with your health care provider. Document Released: 04/18/2005 Document Revised: 09/18/2015 Document Reviewed: 01/03/2014 Elsevier Interactive Patient Education  2018 [employer]. Diet for Irritable Bowel Syndrome When you  have irritable bowel syndrome (IBS), the foods you eat and your eating habits are very important. IBS may cause various symptoms, such as abdominal pain, constipation, or diarrhea. Choosing the right foods can help ease discomfort caused by these symptoms. Work with your health care provider and dietitian to find the best eating plan to help control your symptoms. What general guidelines do I need to follow?  Keep a food diary. This will help you identify foods that cause symptoms. Write down: ? What you eat and when. ? What symptoms you have. ? When symptoms occur in relation to your meals.  Avoid foods that cause symptoms. Talk with your dietitian about other ways to get the same nutrients that are in these foods.  Eat more foods that contain fiber. Take a fiber supplement if directed by your dietitian.  Eat your meals slowly, in a relaxed  setting.  Aim to eat 5-6 small meals per day. Do not skip meals.  Drink enough fluids to keep your urine clear or pale yellow.  Ask your health care provider if you should take an over-the-counter probiotic during flare-ups to help restore healthy gut bacteria.  If you have cramping or diarrhea, try making your meals low in fat and high in carbohydrates. Examples of carbohydrates are pasta, rice, whole grain breads and cereals, fruits, and vegetables.  If dairy products cause your symptoms to flare up, try eating less of them. You might be able to handle yogurt better than other dairy products because it contains bacteria that help with digestion. What foods are not recommended? The following are some foods and drinks that may worsen your symptoms:  Fatty foods, such as [place] fries.  Milk products, such as cheese or ice cream.  Chocolate.  Alcohol.  Products with caffeine, such as coffee.  Carbonated drinks, such as soda.  The items listed above may not be a complete list of foods and beverages to avoid. Contact your dietitian for more  information. What foods are good sources of fiber? Your health care provider or dietitian may recommend that you eat more foods that contain fiber. Fiber can help reduce constipation and other IBS symptoms. Add foods with fiber to your diet a little at a time so that your body can get used to them. Too much fiber at once might cause gas and swelling of your abdomen. The following are some foods that are good sources of fiber:  Apples.  Peaches.  Pears.  Berries.  Figs.  Broccoli (raw).  Cabbage.  Carrots.  Raw peas.  Kidney beans.  Lima beans.  Whole grain bread.  Whole grain cereal.  Where to find more information: [employer] for Functional Gastrointestinal Disorders: www.iffgd.[employer] of Diabetes and Digestive and Kidney Diseases: http://norris-lawson.com/.aspx This information is not intended to replace advice given to you by your health care provider. Make sure you discuss any questions you have with your health care provider. Document Released: 07/09/2003 Document Revised: 09/24/2015 Document Reviewed: 07/19/2013 Elsevier Interactive Patient Education  2018 [employer].

## 2017-06-23 NOTE — Progress Notes (Deleted)
Patient ID: Aaron [name], male    DOB: 01-Dec-1970, 47 y.o.   MRN: [id number]  PCP: Bing Neighbors, [name]  Chief Complaint  Patient presents with  . Follow-up    1 week on asthma     Subjective:  HPI  Aaron [name] is a 47 y.o. male presents for evaluation for 1 week follow up with recent complaints of diarrhea, and nausea. Was seen approximately a week and a half ago in the clinic. Patient reports no more hallucinations since discontinuing the xanax. He is till having some diarrhea, reports 5 loose stools over the last 48 hrs. He denies chest pain, palpitations, and shortness of breath. Pt reports that he has only had to use nebulizer treatments 1-2 times over the last week and has not required and increase in use of his inhaler.   Social History   Socioeconomic History  . Marital status: Married    Spouse name: Not on file  . Number of children: Not on file  . Years of education: Not on file  . Highest education level: Not on file  Social Needs  . Financial resource strain: Not on file  . Food insecurity - worry: Not on file  . Food insecurity - inability: Not on file  . Transportation needs - medical: Not on file  . Transportation needs - non-medical: Not on file  Occupational History  . Not on file  Tobacco Use  . Smoking status: Current Every Day Smoker    Packs/day: 0.50    Types: Cigarettes  . Smokeless tobacco: Never Used  Substance and Sexual Activity  . Alcohol use: Yes    Comment: occasion  . Drug use: Yes    Types: Marijuana  . Sexual activity: Yes  Other Topics Concern  . Not on file  Social History Narrative   ** Merged History Encounter **        Family History  Problem Relation Age of Onset  . Diabetes Mother   . Hypertension Father      Review of Systems  Constitutional: Negative.   Respiratory: Negative.   Gastrointestinal: Positive for diarrhea.  Genitourinary: Negative.     Patient Active Problem List   Diagnosis Date Noted   . COPD with acute exacerbation (HCC) 06/11/2017  . Acute respiratory failure with hypoxia and hypercapnia (HCC) 06/11/2017  . Tobacco abuse (since age 71- > 48 Pack Years) 06/11/2017  . Anxiety 06/11/2017  . Asthma exacerbation 06/06/2017  . Acute on chronic respiratory failure with hypoxia (HCC) 02/23/2017    Allergies  Allergen Reactions  . Keflex [Cephalexin] Diarrhea, Nausea And Vomiting and Rash    Prior to Admission medications   Medication Sig Start Date End Date Taking? Authorizing Provider  ALPRAZolam Prudy Feeler) 0.5 MG tablet Take 1 tablet (0.5 mg total) by mouth every 8 (eight) hours as needed for anxiety or sleep. 06/11/17 06/11/18 Yes Emokpae, Courage, MD  predniSONE (DELTASONE) 20 MG tablet Take 2 tablets (40mg ) daily x 5 days, then 1 tablet (20mg ) daily x 4 days then STOP 06/11/17  Yes Emokpae, Courage, MD  albuterol (PROVENTIL HFA;VENTOLIN HFA) 108 (90 Base) MCG/ACT inhaler Inhale 1-2 puffs into the lungs every 6 (six) hours as needed for wheezing or shortness of breath. Patient not taking: Reported on 06/23/2017 02/01/17   Liberty Handy, PA-C  albuterol (PROVENTIL HFA;VENTOLIN HFA) 108 (90 Base) MCG/ACT inhaler Inhale 2 puffs into the lungs every 4 (four) hours as needed for wheezing or shortness of breath. Patient not  taking: Reported on 06/15/2017 06/11/17   Shon HaleEmokpae, Courage, MD  albuterol (PROVENTIL) (2.5 MG/3ML) 0.083% nebulizer solution Take 3 mLs (2.5 mg total) by nebulization 3 (three) times daily. Patient not taking: Reported on 06/23/2017 06/11/17   Shon HaleEmokpae, Courage, MD  albuterol (PROVENTIL) (5 MG/ML) 0.5% nebulizer solution Take 0.5 mLs (2.5 mg total) by nebulization every 2 (two) hours as needed for wheezing or shortness of breath. Patient not taking: Reported on 06/23/2017 06/11/17   Shon HaleEmokpae, Courage, MD  dicyclomine (BENTYL) 10 MG capsule Take 1 capsule (10 mg total) by mouth 4 (four) times daily -  before meals and at bedtime. 06/23/17 07/23/17  Bing NeighborsHarris, [name], [name]   fluticasone (FLOVENT HFA) 110 MCG/ACT inhaler Inhale 2 puffs into the lungs 2 (two) times daily. Patient not taking: Reported on 06/15/2017 06/11/17   Shon HaleEmokpae, Courage, MD  montelukast (SINGULAIR) 10 MG tablet Take 1 tablet (10 mg total) by mouth at bedtime. Patient not taking: Reported on 06/15/2017 06/11/17 07/11/17  Shon HaleEmokpae, Courage, MD  nicotine (NICODERM CQ - DOSED IN MG/24 HOURS) 14 mg/24hr patch Place 1 patch (14 mg total) onto the skin daily. For smoking cessation Patient not taking: Reported on 06/15/2017 06/11/17 06/11/18  Shon HaleEmokpae, Courage, MD    Past Medical, Surgical Family and Social History reviewed and updated.    Objective:   Today's Vitals   06/23/17 1038  BP: 126/84  Pulse: 100  Resp: 16  Temp: 98.1 F (36.7 C)  TempSrc: Oral  SpO2: 99%  Weight: 168 lb (76.2 kg)  Height: 5\' 10"  (1.778 m)    Wt Readings from Last 3 Encounters:  06/23/17 168 lb (76.2 kg)  06/15/17 160 lb 9.6 oz (72.8 kg)  06/10/17 180 lb 8.9 oz (81.9 kg)    Physical Exam  Constitutional: He is oriented to person, place, and time. He appears well-developed and well-nourished.  Cardiovascular: Normal rate, regular rhythm and normal heart sounds.  Pulmonary/Chest: Effort normal and breath sounds normal.  Abdominal: There is no tenderness.  Neurological: He is alert and oriented to person, place, and time.  Psychiatric: He has a normal mood and affect. His behavior is normal. Judgment and thought content normal.           Assessment & Plan:  1. Diarrhea, unspecified type *** - dicyclomine (BENTYL) 10 MG capsule; Take 1 capsule (10 mg total) by mouth 4 (four) times daily -  before meals and at bedtime.  Dispense: 120 capsule; Refill: 0 - Gastrointestinal Pathogen Panel PCR - Ova and parasite examination - Clostridium Difficile by PCR  2. Asthma, unspecified asthma severity, unspecified whether complicated, unspecified whether persistent Continue prescribed nebs and inhalers   If symptoms  worsen or do not improve, return for follow-up, follow-up with PCP, or at the emergency department if severity of symptoms warrant a higher level of care.

## 2017-08-31 ENCOUNTER — Encounter (HOSPITAL_COMMUNITY): Payer: Self-pay | Admitting: Emergency Medicine

## 2017-08-31 ENCOUNTER — Emergency Department (HOSPITAL_COMMUNITY): Payer: Self-pay

## 2017-08-31 ENCOUNTER — Emergency Department (HOSPITAL_COMMUNITY)
Admission: EM | Admit: 2017-08-31 | Discharge: 2017-08-31 | Disposition: A | Discharge status: Home / Self Care | Payer: Self-pay | Attending: Emergency Medicine | Admitting: Emergency Medicine

## 2017-08-31 ENCOUNTER — Other Ambulatory Visit: Payer: Self-pay

## 2017-08-31 DIAGNOSIS — Z79899 Other long term (current) drug therapy: Secondary | ICD-10-CM | POA: Insufficient documentation

## 2017-08-31 DIAGNOSIS — F17210 Nicotine dependence, cigarettes, uncomplicated: Secondary | ICD-10-CM | POA: Insufficient documentation

## 2017-08-31 DIAGNOSIS — J45901 Unspecified asthma with (acute) exacerbation: Secondary | ICD-10-CM | POA: Insufficient documentation

## 2017-08-31 DIAGNOSIS — J449 Chronic obstructive pulmonary disease, unspecified: Secondary | ICD-10-CM | POA: Insufficient documentation

## 2017-08-31 LAB — COMPREHENSIVE METABOLIC PANEL
ALK PHOS: 76 U/L (ref 38–126)
ALT: 43 U/L (ref 17–63)
ANION GAP: 11 (ref 5–15)
AST: 34 U/L (ref 15–41)
Albumin: 4.5 g/dL (ref 3.5–5.0)
BUN: 15 mg/dL (ref 6–20)
CALCIUM: 9.5 mg/dL (ref 8.9–10.3)
CO2: 26 mmol/L (ref 22–32)
Chloride: 106 mmol/L (ref 101–111)
Creatinine, Ser: 1.21 mg/dL (ref 0.61–1.24)
GFR calc Af Amer: >60 mL/min (ref >60)
GFR calc non Af Amer: >60 mL/min (ref >60)
GLUCOSE: 104 mg/dL — AB (ref 65–99)
POTASSIUM: 4 mmol/L (ref 3.5–5.1)
SODIUM: 143 mmol/L (ref 135–145)
Total Bilirubin: 0.8 mg/dL (ref 0.3–1.2)
Total Protein: 7.8 g/dL (ref 6.5–8.1)

## 2017-08-31 LAB — I-STAT CG4 LACTIC ACID, ED: Lactic Acid, Venous: 1.08 mmol/L (ref 0.5–1.9)

## 2017-08-31 LAB — URINALYSIS, ROUTINE W REFLEX MICROSCOPIC
Bilirubin Urine: NEGATIVE
Glucose, UA: NEGATIVE mg/dL
Hgb urine dipstick: NEGATIVE
Ketones, ur: NEGATIVE mg/dL
LEUKOCYTES UA: NEGATIVE
NITRITE: NEGATIVE
PROTEIN: NEGATIVE mg/dL
SPECIFIC GRAVITY, URINE: 1.02 (ref 1.005–1.030)
pH: 7 (ref 5.0–8.0)

## 2017-08-31 LAB — CBC WITH DIFFERENTIAL/PLATELET
Basophils Absolute: 0.1 10*3/uL (ref 0.0–0.1)
Basophils Relative: 1 %
EOS PCT: 6 %
Eosinophils Absolute: 0.4 10*3/uL (ref 0.0–0.7)
HEMATOCRIT: 42.3 % (ref 39.0–52.0)
Hemoglobin: 14.1 g/dL (ref 13.0–17.0)
LYMPHS PCT: 27 %
Lymphs Abs: 1.7 10*3/uL (ref 0.7–4.0)
MCH: 30.6 pg (ref 26.0–34.0)
MCHC: 33.3 g/dL (ref 30.0–36.0)
MCV: 91.8 fL (ref 78.0–100.0)
MONO ABS: 0.7 10*3/uL (ref 0.1–1.0)
Monocytes Relative: 11 %
NEUTROS ABS: 3.5 10*3/uL (ref 1.7–7.7)
Neutrophils Relative %: 55 %
PLATELETS: 205 10*3/uL (ref 150–400)
RBC: 4.61 MIL/uL (ref 4.22–5.81)
RDW: 13.6 % (ref 11.5–15.5)
WBC: 6.3 10*3/uL (ref 4.0–10.5)

## 2017-08-31 MED ORDER — LORAZEPAM 1 MG PO TABS
1.0000 mg | ORAL_TABLET | Freq: Once | ORAL | Status: AC
  Administered 2017-08-31: 1 mg via ORAL
  Filled 2017-08-31: qty 1

## 2017-08-31 MED ORDER — METHYLPREDNISOLONE SODIUM SUCC 125 MG IJ SOLR
125.0000 mg | Freq: Once | INTRAMUSCULAR | Status: AC
  Administered 2017-08-31: 125 mg via INTRAVENOUS
  Filled 2017-08-31: qty 2

## 2017-08-31 MED ORDER — PREDNISONE 10 MG PO TABS: 40.0000 mg | ORAL_TABLET | Freq: Every day | ORAL | 0 refills | Status: AC

## 2017-08-31 MED ORDER — MAGNESIUM SULFATE 2 GM/50ML IV SOLN
2.0000 g | Freq: Once | INTRAVENOUS | Status: AC
Start: 2017-08-31 — End: 2017-08-31
  Administered 2017-08-31: 2 g via INTRAVENOUS
  Filled 2017-08-31: qty 50

## 2017-08-31 MED ORDER — ALBUTEROL SULFATE (2.5 MG/3ML) 0.083% IN NEBU: 5.0000 mg | INHALATION_SOLUTION | Freq: Once | RESPIRATORY_TRACT | Status: DC

## 2017-08-31 MED ORDER — ALBUTEROL (5 MG/ML) CONTINUOUS INHALATION SOLN
10.0000 mg/h | INHALATION_SOLUTION | Freq: Once | RESPIRATORY_TRACT | Status: AC
  Administered 2017-08-31: 10 mg/h via RESPIRATORY_TRACT
  Filled 2017-08-31: qty 20

## 2017-08-31 NOTE — Discharge Instructions (Addendum)
Take prednisone as prescribed. Use your inhalers as prescribed. Follow up with your primary care doctor for further evaluation of your asthma and anxiety. Return to the emergency room if you develop difficulty breathing, chest pain, or any new or concerning symptoms.

## 2017-08-31 NOTE — ED Provider Notes (Signed)
[facility]-EMERGENCY DEPT Provider Note   CSN: [id number] Arrival date & time: 08/31/17  0947     History   Chief Complaint Chief Complaint  Patient presents with  . Cough    HPI Aaron [name] is a 46 y.o. male presenting for evaluation of cough and shortness of breath.  Patient states he has had worsening shortness of breath and cough over the past 24 to 48 hours.  He states he woke up multiple times last night gasping for air.  This is mildly improved with his albuterol inhaler, but recurred several hours later.  He denies fevers, chills, chest pain, nausea, vomiting, abdominal pain, urinary symptoms, abnormal bowel movements, leg pain or swelling.  He has multiple sick contacts including his fiance, all with viral URI symptoms.  He denies recent travel, surgery, immobilization, trauma, hormone use, history of cancer, or history of DVT/PE.  Patient was hospitalized in February for asthma exacerbation, requiring intubation.  Neysa Bonito states patient has been very anxious recently.  Last time he was admitted to the hospital, his asthma was triggered by his father's death.  Recently his cousin died, and patient has been anxious since this.  HPI  Past Medical History:  Diagnosis Date  . Asthma     Patient Active Problem List   Diagnosis Date Noted  . COPD with acute exacerbation (HCC) 06/11/2017  . Acute respiratory failure with hypoxia and hypercapnia (HCC) 06/11/2017  . Tobacco abuse (since age 58- > 87 Pack Years) 06/11/2017  . Anxiety 06/11/2017  . Asthma exacerbation 06/06/2017  . Acute on chronic respiratory failure with hypoxia (HCC) 02/23/2017    Past Surgical History:  Procedure Laterality Date  . ABDOMINAL SURGERY    . HERNIA REPAIR          Home Medications    Prior to Admission medications   Medication Sig Start Date End Date Taking? Authorizing Provider  albuterol (PROVENTIL HFA;VENTOLIN HFA) 108 (90 Base) MCG/ACT inhaler Inhale 2  puffs into the lungs every 4 (four) hours as needed for wheezing or shortness of breath. 06/11/17  Yes Emokpae, Courage, MD  albuterol (PROVENTIL) (5 MG/ML) 0.5% nebulizer solution Take 0.5 mLs (2.5 mg total) by nebulization every 2 (two) hours as needed for wheezing or shortness of breath. 06/11/17  Yes Emokpae, Courage, MD  cetirizine (ZYRTEC) 10 MG tablet Take 10 mg by mouth daily.   Yes [provider]  albuterol (PROVENTIL HFA;VENTOLIN HFA) 108 (90 Base) MCG/ACT inhaler Inhale 1-2 puffs into the lungs every 6 (six) hours as needed for wheezing or shortness of breath. Patient not taking: Reported on 06/23/2017 02/01/17   Liberty Handy, PA-C  albuterol (PROVENTIL) (2.5 MG/3ML) 0.083% nebulizer solution Take 3 mLs (2.5 mg total) by nebulization 3 (three) times daily. Patient not taking: Reported on 06/23/2017 06/11/17   Shon Hale, MD  ALPRAZolam Prudy Feeler) 0.5 MG tablet Take 1 tablet (0.5 mg total) by mouth every 8 (eight) hours as needed for anxiety or sleep. Patient not taking: Reported on 08/31/2017 06/11/17 06/11/18  Shon Hale, MD  dicyclomine (BENTYL) 10 MG capsule Take 1 capsule (10 mg total) by mouth 4 (four) times daily -  before meals and at bedtime. 06/23/17 07/23/17  Bing Neighbors, [name]  fluticasone (FLOVENT HFA) 110 MCG/ACT inhaler Inhale 2 puffs into the lungs 2 (two) times daily. Patient not taking: Reported on 06/15/2017 06/11/17   Shon Hale, MD  montelukast (SINGULAIR) 10 MG tablet Take 1 tablet (10 mg total) by mouth at  bedtime. Patient not taking: Reported on 06/15/2017 06/11/17 07/11/17  Shon Hale, MD  nicotine (NICODERM CQ - DOSED IN MG/24 HOURS) 14 mg/24hr patch Place 1 patch (14 mg total) onto the skin daily. For smoking cessation Patient not taking: Reported on 06/15/2017 06/11/17 06/11/18  Shon Hale, MD  predniSONE (DELTASONE) 10 MG tablet Take 4 tablets (40 mg total) by mouth daily for 5 days. 08/31/17 09/05/17  Curley Fayette, PA-C    Family  History Family History  Problem Relation Age of Onset  . Diabetes Mother   . Hypertension Father     Social History Social History   Tobacco Use  . Smoking status: Current Every Day Smoker    Packs/day: 0.50    Types: Cigarettes  . Smokeless tobacco: Never Used  Substance Use Topics  . Alcohol use: Yes    Comment: occasion  . Drug use: Yes    Types: Marijuana     Allergies   Keflex [cephalexin]   Review of Systems Review of Systems  Respiratory: Positive for cough, chest tightness, shortness of breath and wheezing.   Psychiatric/Behavioral: The patient is nervous/anxious.   All other systems reviewed and are negative.    Physical Exam Updated Vital Signs BP 128/80   Pulse 94   Temp 98.5 F (36.9 C) (Oral)   Resp 20   Ht  (1.778 m)   Wt 79.4 kg (175 lb)   SpO2 93%   BMI 25.11 kg/m   Physical Exam  Constitutional: He is oriented to person, place, and time. He appears well-developed and well-nourished. No distress.  Pt with increased WOB, no respiratory distress  HENT:  Head: Normocephalic and atraumatic.  Eyes: Pupils are equal, round, and reactive to light. Conjunctivae and EOM are normal.  Neck: Normal range of motion. Neck supple.  Cardiovascular: Normal rate, regular rhythm and intact distal pulses.  Pulmonary/Chest: Accessory muscle usage present. Tachypnea noted. No respiratory distress. He has wheezes.  Tachypnea.  Increased work of breathing without signs of respiratory distress.  Speaking in full sentences.  Significant expiratory wheezing in all fields.  Abdominal: Soft. He exhibits no distension. There is no tenderness.  Musculoskeletal: Normal range of motion.  No leg pain or swelling.  Radial and pedal pulses intact bilaterally.  Neurological: He is alert and oriented to person, place, and time.  Skin: Skin is warm and dry.  Psychiatric: He has a normal mood and affect.  Nursing note and vitals reviewed.    ED Treatments / Results    Labs (all labs ordered are listed, but only abnormal results are displayed) Labs Reviewed  COMPREHENSIVE METABOLIC PANEL - Abnormal; Notable for the following components:      Result Value   Glucose, Bld 104 (*)    All other components within normal limits  CBC WITH DIFFERENTIAL/PLATELET  URINALYSIS, ROUTINE W REFLEX MICROSCOPIC  I-STAT CG4 LACTIC ACID, ED    EKG None  Radiology Dg Chest 2 View  Result Date: 08/31/2017 CLINICAL DATA:  Cough, shortness of breath, chest tightness for 2 days EXAM: CHEST - 2 VIEW COMPARISON:  Chest x-ray of 06/15/2017 FINDINGS: No active infiltrate or effusion is seen. The lungs are slightly hyperaerated. Peribronchial thickening is noted which may indicate bronchitis. Mediastinal and hilar contours are unremarkable. The heart is within normal limits in size. No bony abnormality is seen. IMPRESSION: No pneumonia or effusion.  Question bronchitis. Electronically Signed   By: Dwyane Dee M.D.   On: 08/31/2017 11:38    Procedures Procedures (  including critical care time)  Medications Ordered in ED Medications  LORazepam (ATIVAN) tablet 1 mg (1 mg Oral Given 08/31/17 1025)  albuterol (PROVENTIL,VENTOLIN) solution continuous neb (10 mg/hr Nebulization Given 08/31/17 1020)  methylPREDNISolone sodium succinate (SOLU-MEDROL) 125 mg/2 mL injection 125 mg (125 mg Intravenous Given 08/31/17 1030)  magnesium sulfate IVPB 2 g 50 mL (0 g Intravenous Stopped 08/31/17 1130)     Initial Impression / Assessment and Plan / ED Course  I have reviewed the triage vital signs and the nursing notes.  Pertinent labs & imaging results that were available during my care of the patient were reviewed by me and considered in my medical decision making (see chart for details).     Patient presenting for evaluation of shortness of breath and cough.  Physical exam shows patient who is tachypneic and has increased work of breathing without respiratory distress.  Sats stable on room air.   Wheezing in all fields.  Will start on a continuous neb, give steroids and magnesium and reassess.  Ativan for anxiety. Will obtain chest x-ray and basic labs to rule out infection.  After continuous neb and medications, patient reports improvement of breathing.  On reexamination, almost no wheezing.  Tachypnea, tachycardia, and accessory muscle use resolved.  X-ray reviewed and interpreted by me, no sign of pneumonia, pneumothorax, or effusions.  Labs reassuring.  Discussed findings with patient.  Discussed treatment with prednisone, inhalers, and close follow-up with PCP.  Strict return precautions given.  At this time, patient appears safe for discharge.  Patient states he understands and agrees to plan.  Final Clinical Impressions(s) / ED Diagnoses   Final diagnoses:  Asthma with acute exacerbation, unspecified asthma severity, unspecified whether persistent    ED Discharge Orders        Ordered    predniSONE (DELTASONE) 10 MG tablet  Daily     08/31/17 1243       Allante Whitmire, PA-C 08/31/17 1502    Derwood Kaplan, MD 09/01/17 [phone]

## 2017-08-31 NOTE — ED Triage Notes (Signed)
Pt complaint of cough onset yesterday; hx of asthma unrelieved by home medication.

## 2017-09-12 ENCOUNTER — Ambulatory Visit: Payer: Self-pay | Admitting: Family Medicine

## 2017-09-20 ENCOUNTER — Ambulatory Visit (INDEPENDENT_AMBULATORY_CARE_PROVIDER_SITE_OTHER): Payer: Self-pay | Admitting: Family Medicine

## 2017-09-20 ENCOUNTER — Encounter: Payer: Self-pay | Admitting: Family Medicine

## 2017-09-20 VITALS — BP 122/82 | HR 90 | Temp 98.0°F | Ht 70.0 in | Wt 180.2 lb

## 2017-09-20 DIAGNOSIS — Z1211 Encounter for screening for malignant neoplasm of colon: Secondary | ICD-10-CM

## 2017-09-20 DIAGNOSIS — Z131 Encounter for screening for diabetes mellitus: Secondary | ICD-10-CM

## 2017-09-20 DIAGNOSIS — Z0000 Encounter for general adult medical examination without abnormal findings: Secondary | ICD-10-CM

## 2017-09-20 DIAGNOSIS — Z716 Tobacco abuse counseling: Secondary | ICD-10-CM

## 2017-09-20 DIAGNOSIS — J4530 Mild persistent asthma, uncomplicated: Secondary | ICD-10-CM

## 2017-09-20 DIAGNOSIS — Z09 Encounter for follow-up examination after completed treatment for conditions other than malignant neoplasm: Secondary | ICD-10-CM

## 2017-09-20 DIAGNOSIS — Z125 Encounter for screening for malignant neoplasm of prostate: Secondary | ICD-10-CM

## 2017-09-20 LAB — POCT URINALYSIS DIP (MANUAL ENTRY)
Bilirubin, UA: NEGATIVE
Blood, UA: NEGATIVE
Glucose, UA: NEGATIVE mg/dL
Ketones, POC UA: NEGATIVE mg/dL
Leukocytes, UA: NEGATIVE
Nitrite, UA: NEGATIVE
Protein Ur, POC: NEGATIVE mg/dL
Spec Grav, UA: 1.025 (ref 1.010–1.025)
Urobilinogen, UA: 0.2 E.U./dL
pH, UA: 7 (ref 5.0–8.0)

## 2017-09-20 LAB — POCT GLYCOSYLATED HEMOGLOBIN (HGB A1C): Hemoglobin A1C: 5.4 % (ref 4.0–5.6)

## 2017-09-20 MED ORDER — CETIRIZINE HCL 10 MG PO TABS: 10.0000 mg | ORAL_TABLET | Freq: Every day | ORAL | 3 refills | Status: DC

## 2017-09-20 MED ORDER — FLUTICASONE PROPIONATE HFA 110 MCG/ACT IN AERO: 2.0000 | INHALATION_SPRAY | Freq: Two times a day (BID) | RESPIRATORY_TRACT | 2 refills | Status: DC

## 2017-09-20 MED ORDER — ALPRAZOLAM 0.5 MG PO TABS: 0.5000 mg | ORAL_TABLET | Freq: Three times a day (TID) | ORAL | 0 refills | Status: DC | PRN

## 2017-09-20 NOTE — Progress Notes (Signed)
Subjective:     Patient ID: Aaron [name], male   DOB: 1970-11-24, 47 y.o.   MRN: [id number]   PCP: Raliegh Ip, NP  Chief Complaint  Patient presents with  . Annual Exam    HPI  [name] has history of Asthma. He is here today for his Annual Physical.    Current Status: Patient arrives today with friend. He states that he continues to use Albuterol Inhaler more frequently lately. He states his nebulizers 2-3 times daily. He has cough and shortness of breath. He denies chest pain and heart palpitations. He denies fevers, chills, recent infections, fatigue, weight loss, and night sweats.   Denies visual changes, dizziness, headaches, and falls.   He states that he continue to smoke. He denies any episodes of bleeding. He has a good appetite. He has normal bowel movements. He denies abdominal pain, nausea, vomiting, diarrhea, constipation.   He denies anxiety and depression.  He has generalized joint and shoulder pain.   Past Medical History:  Diagnosis Date  . Asthma     Family History  Problem Relation Age of Onset  . Diabetes Mother   . Hypertension Father    Social History   Socioeconomic History  . Marital status: Married    Spouse name: Not on file  . Number of children: Not on file  . Years of education: Not on file  . Highest education level: Not on file  Occupational History  . Not on file  Social Needs  . Financial resource strain: Not on file  . Food insecurity:    Worry: Not on file    Inability: Not on file  . Transportation needs:    Medical: Not on file    Non-medical: Not on file  Tobacco Use  . Smoking status: Current Every Day Smoker    Packs/day: 0.50    Types: Cigarettes  . Smokeless tobacco: Never Used  Substance and Sexual Activity  . Alcohol use: Yes    Comment: occasion  . Drug use: Yes    Types: Marijuana  . Sexual activity: Yes  Lifestyle  . Physical activity:    Days per week: Not on file    Minutes per session: Not on  file  . Stress: Not on file  Relationships  . Social connections:    Talks on phone: Not on file    Gets together: Not on file    Attends religious service: Not on file    Active member of club or organization: Not on file    Attends meetings of clubs or organizations: Not on file    Relationship status: Not on file  . Intimate partner violence:    Fear of current or ex partner: Not on file    Emotionally abused: Not on file    Physically abused: Not on file    Forced sexual activity: Not on file  Other Topics Concern  . Not on file  Social History Narrative   ** Merged History Encounter **        Immunization History  Administered Date(s) Administered  . Influenza,inj,Quad PF,6+ Mos 02/25/2017  . Pneumococcal Polysaccharide-23 02/25/2017  . Tdap 03/15/2017    Past Surgical History:  Procedure Laterality Date  . ABDOMINAL SURGERY    . HERNIA REPAIR     Current Meds  Medication Sig  . albuterol (PROVENTIL HFA;VENTOLIN HFA) 108 (90 Base) MCG/ACT inhaler Inhale 1-2 puffs into the lungs every 6 (six) hours as needed for wheezing or shortness  of breath.  Marland Kitchen albuterol (PROVENTIL HFA;VENTOLIN HFA) 108 (90 Base) MCG/ACT inhaler Inhale 2 puffs into the lungs every 4 (four) hours as needed for wheezing or shortness of breath.  Marland Kitchen albuterol (PROVENTIL) (2.5 MG/3ML) 0.083% nebulizer solution Take 3 mLs (2.5 mg total) by nebulization 3 (three) times daily.  Marland Kitchen albuterol (PROVENTIL) (5 MG/ML) 0.5% nebulizer solution Take 0.5 mLs (2.5 mg total) by nebulization every 2 (two) hours as needed for wheezing or shortness of breath.  . cetirizine (ZYRTEC) 10 MG tablet Take 1 tablet (10 mg total) by mouth daily.  . fluticasone (FLOVENT HFA) 110 MCG/ACT inhaler Inhale 2 puffs into the lungs 2 (two) times daily.  . [DISCONTINUED] cetirizine (ZYRTEC) 10 MG tablet Take 10 mg by mouth daily.  . [DISCONTINUED] fluticasone (FLOVENT HFA) 110 MCG/ACT inhaler Inhale 2 puffs into the lungs 2 (two) times daily.   . [DISCONTINUED] fluticasone (FLOVENT HFA) 110 MCG/ACT inhaler Inhale 2 puffs into the lungs 2 (two) times daily.   Allergies  Allergen Reactions  . Keflex [Cephalexin] Diarrhea, Nausea And Vomiting and Rash    hallucinations    BP 122/82 (BP Location: Left Arm, Patient Position: Sitting, Cuff Size: Small)   Pulse 90   Temp 98 F (36.7 C) (Oral)   Ht  (1.778 m)   Wt 180 lb 3.2 oz (81.7 kg)   SpO2 96%   BMI 25.86 kg/m    Review of Systems  Constitutional: Negative.   HENT: Negative.   Eyes: Negative.   Respiratory: Positive for cough (occasional ), shortness of breath (occasional) and wheezing (expiratory wheezes bilaterally mid and lower lobes).   Cardiovascular: Negative.   Gastrointestinal: Negative.   Endocrine: Negative.   Genitourinary: Negative.   Musculoskeletal: Negative.   Skin: Negative.   Allergic/Immunologic: Negative.   Neurological: Negative.   Hematological: Negative.   Psychiatric/Behavioral: Negative.        Objective:   Physical Exam  Constitutional: He is oriented to person, place, and time. He appears well-developed and well-nourished.  HENT:  Head: Normocephalic and atraumatic.  Right Ear: External ear normal.  Left Ear: External ear normal.  Nose: Nose normal.  Mouth/Throat: Oropharynx is clear and moist.  Eyes: Pupils are equal, round, and reactive to light. Conjunctivae and EOM are normal.  Neck: Normal range of motion. Neck supple.  Cardiovascular: Normal rate, regular rhythm, normal heart sounds and intact distal pulses.  Pulmonary/Chest: Effort normal. He has wheezes (auscultated mid and lower lobes bilaterally).  Abdominal: Soft. Bowel sounds are normal.  Genitourinary: Prostate normal. Rectal exam shows guaiac negative stool.  Musculoskeletal: Normal range of motion.  Neurological: He is alert and oriented to person, place, and time.  Skin: Skin is warm and dry. Capillary refill takes less than 2 seconds.  Psychiatric: He has  a normal mood and affect. His behavior is normal. Judgment and thought content normal.  Nursing note and vitals reviewed.  Assessment:   1. Annual Physical 22 Screening for diabetes mellitus 3. Mild persistent asthma, unspecified whether complicated. 4. Tobacco abuse counseling 5. Encounter for special screening examination for neoplasm of prostate 6. Encounter for fecal occult stool 7. Follow up  Plan:   1. Annual Physical ECG on 06/2017 was normal. Prostate exam today. Occult stool blood test done today.   2. Screening for diabetes mellitus Hgb A1c is normal at 5.4 today. Glucose is 104 today. Urinalysis was normal.  Labs for Lipid panel, CBC, CMP are pending.   - POCT glycosylated hemoglobin (Hb A1C) -  POCT urinalysis dipstick - Comprehensive metabolic panel; Future  3. Mild persistent asthma, unspecified whether complicated Stable today. Continue to use Flovent BID Inhaler, Zyrtec 10 mg, and Albuterol Inhaler as directed. 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   4. Tobacco abuse counseling Patient will contact office when ready to quit smoking.   5. Encounter for special screening examination for neoplasm of prostate Digital Prostate exam performed today.  - Lipid Panel - CBC with Differential - Vitamin D, 25-hydroxy  6. Encounter for screening fecal occult blood testing Fecal blood test performed today. Negative results.   7. Follow up Follow up in 3 months.   Meds ordered this encounter  Medications  . DISCONTD: fluticasone (FLOVENT HFA) 110 MCG/ACT inhaler    Sig: Inhale 2 puffs into the lungs 2 (two) times daily.    Dispense:  1 Inhaler    Refill:  2  . cetirizine (ZYRTEC) 10 MG tablet    Sig: Take 1 tablet (10 mg total) by mouth daily.    Dispense:  30 tablet    Refill:  3  . DISCONTD: ALPRAZolam (XANAX) 0.5 MG tablet    Sig: Take 1 tablet (0.5 mg total) by mouth every 8 (eight) hours as needed for anxiety or sleep.     Dispense:  15 tablet    Refill:  0  . ALPRAZolam (XANAX) 0.5 MG tablet    Sig: Take 1 tablet (0.5 mg total) by mouth every 8 (eight) hours as needed for anxiety or sleep.    Dispense:  15 tablet    Refill:  0    Order Specific Question:   Supervising Provider    Answer:   Quentin Angst [id number]  . fluticasone (FLOVENT HFA) 110 MCG/ACT inhaler    Sig: Inhale 2 puffs into the lungs 2 (two) times daily.    Dispense:  1 Inhaler    Refill:  2   Raliegh Ip,  MSN, [name]-BC [facility] [address] [place], [place] [address] [phone]

## 2017-09-20 NOTE — Progress Notes (Deleted)
ANNUAL PREVENTATIVE VISIT AND CPE  Subjective:  Aaron [name] is a 47 y.o. male who presents for Medicare Annual Wellness Visit and complete physical.  Date of last medicare wellness visit is unknown.  He has had elevated blood pressure for *** years. His blood pressure {HAS HAS NOT:18834} been controlled at home, today their BP is BP: 122/82 He {DOES_DOES WUJ:81191} workout. He denies chest pain, shortness of breath, dizziness.  He {ACTION; IS/IS YNW:29562130} on cholesterol medication and denies myalgias. His cholesterol {ACTION; IS/IS NOT:21021397} at goal. The cholesterol last visit was:   Lab Results  Component Value Date   TRIG 145 06/09/2017   He has had diabetes for *** years. He {Has/has not:18111} been working on diet and exercise for ***prediabetes, and denies {Symptoms; diabetes w/o none:19199}. Last A1C in the office was:  Lab Results  Component Value Date   HGBA1C 5.4 09/20/2017   Patient is on Vitamin D supplement.   No results found for: VD25OH     Names of Other Physician/Practitioners you currently use: 1. [facility] here for primary care 2. ***, eye doctor, last visit *** 3. ***, dentist, last visit *** Patient Care Team: Kallie Locks, [name] as PCP - General (Family Medicine) Patient, No Pcp Per (General Practice)   Medication Review: Current Outpatient Medications on File Prior to Visit  Medication Sig Dispense Refill  . cetirizine (ZYRTEC) 10 MG tablet Take 10 mg by mouth daily.    . fluticasone (FLOVENT HFA) 110 MCG/ACT inhaler Inhale 2 puffs into the lungs 2 (two) times daily. 1 Inhaler 2  . albuterol (PROVENTIL HFA;VENTOLIN HFA) 108 (90 Base) MCG/ACT inhaler Inhale 1-2 puffs into the lungs every 6 (six) hours as needed for wheezing or shortness of breath. (Patient not taking: Reported on 06/23/2017) 1 Inhaler 0  . albuterol (PROVENTIL HFA;VENTOLIN HFA) 108 (90 Base) MCG/ACT inhaler Inhale 2 puffs into the lungs every 4 (four) hours as  needed for wheezing or shortness of breath. 1 Inhaler 2  . albuterol (PROVENTIL) (2.5 MG/3ML) 0.083% nebulizer solution Take 3 mLs (2.5 mg total) by nebulization 3 (three) times daily. (Patient not taking: Reported on 06/23/2017) 30 vial 3  . albuterol (PROVENTIL) (5 MG/ML) 0.5% nebulizer solution Take 0.5 mLs (2.5 mg total) by nebulization every 2 (two) hours as needed for wheezing or shortness of breath. 20 mL 12  . ALPRAZolam (XANAX) 0.5 MG tablet Take 1 tablet (0.5 mg total) by mouth every 8 (eight) hours as needed for anxiety or sleep. (Patient not taking: Reported on 08/31/2017) 15 tablet 0  . dicyclomine (BENTYL) 10 MG capsule Take 1 capsule (10 mg total) by mouth 4 (four) times daily -  before meals and at bedtime. 120 capsule 0  . montelukast (SINGULAIR) 10 MG tablet Take 1 tablet (10 mg total) by mouth at bedtime. (Patient not taking: Reported on 06/15/2017) 30 tablet 2   No current facility-administered medications on file prior to visit.     Current Problems (verified) Patient Active Problem List   Diagnosis Date Noted  . COPD with acute exacerbation (HCC) 06/11/2017  . Acute respiratory failure with hypoxia and hypercapnia (HCC) 06/11/2017  . Tobacco abuse (since age 51- > 42 Pack Years) 06/11/2017  . Anxiety 06/11/2017  . Asthma exacerbation 06/06/2017  . Acute on chronic respiratory failure with hypoxia (HCC) 02/23/2017    Screening Tests Health Maintenance  Topic Date Due  . INFLUENZA VACCINE  11/30/2017  . TETANUS/TDAP  03/16/2027  . HIV Screening  Completed    Immunization History  Administered Date(s) Administered  . Influenza,inj,Quad PF,6+ Mos 02/25/2017  . Pneumococcal Polysaccharide-23 02/25/2017  . Tdap 03/15/2017    Preventative care: Last colonoscopy: *** Last mammogram: *** Last pap smear/pelvic exam: ***   DEXA:***  Prior vaccinations: TD or Tdap: ***  Influenza: ***  Pneumococcal: *** Prevnar13:  Shingles/Zostavax: *** This medication is not  covered by your insurance if dispensed from your Physician's office. You can obtain your vaccine at an area Pharmacy.  Allergies as of 09/20/2017      Reactions   Keflex [cephalexin] Diarrhea, Nausea And Vomiting, Rash   hallucinations      Medication List        Accurate as of 09/20/17  9:20 AM. Always use your most recent med list.          albuterol 108 (90 Base) MCG/ACT inhaler Commonly known as:  PROVENTIL HFA;VENTOLIN HFA Inhale 1-2 puffs into the lungs every 6 (six) hours as needed for wheezing or shortness of breath.   albuterol 108 (90 Base) MCG/ACT inhaler Commonly known as:  PROVENTIL HFA;VENTOLIN HFA Inhale 2 puffs into the lungs every 4 (four) hours as needed for wheezing or shortness of breath.   albuterol (2.5 MG/3ML) 0.083% nebulizer solution Commonly known as:  PROVENTIL Take 3 mLs (2.5 mg total) by nebulization 3 (three) times daily.   albuterol (5 MG/ML) 0.5% nebulizer solution Commonly known as:  PROVENTIL Take 0.5 mLs (2.5 mg total) by nebulization every 2 (two) hours as needed for wheezing or shortness of breath.   ALPRAZolam 0.5 MG tablet Commonly known as:  XANAX Take 1 tablet (0.5 mg total) by mouth every 8 (eight) hours as needed for anxiety or sleep.   cetirizine 10 MG tablet Commonly known as:  ZYRTEC Take 10 mg by mouth daily.   dicyclomine 10 MG capsule Commonly known as:  BENTYL Take 1 capsule (10 mg total) by mouth 4 (four) times daily -  before meals and at bedtime.   fluticasone 110 MCG/ACT inhaler Commonly known as:  FLOVENT HFA Inhale 2 puffs into the lungs 2 (two) times daily.   montelukast 10 MG tablet Commonly known as:  SINGULAIR Take 1 tablet (10 mg total) by mouth at bedtime.       Past Surgical History:  Procedure Laterality Date  . ABDOMINAL SURGERY    . HERNIA REPAIR     Family History  Problem Relation Age of Onset  . Diabetes Mother   . Hypertension Father     History reviewed: allergies, current  medications, past family history, past medical history, past social history, past surgical history and problem list   Risk Factors: Osteoporosis/FallRisk: {osteoporosis causes:17871} In the past year have you fallen or had a near fall?:{yes/no (default no):140031::"No"} History of fracture in the past year: {YES NO:22349}  Tobacco Social History   Tobacco Use  . Smoking status: Current Every Day Smoker    Packs/day: 0.50    Types: Cigarettes  . Smokeless tobacco: Never Used  Substance Use Topics  . Alcohol use: Yes    Comment: occasion  . Drug use: Yes    Types: Marijuana   He {does/does not:19097} smoke.  Patient {ACTION; IS/IS ZOX:09604540} a former smoker. Are there smokers in your home (other than you)?  {yes/no:20286}  Alcohol Current alcohol use: {history; alcohol:11675}  Caffeine Current caffeine use: {caffeine use:31917}  Exercise Current exercise: {exercise types:16438}  Nutrition/Diet Current diet: {diet habits:16563}  Cardiac risk factors: {risk factors:510}.  Depression  Screen (Note: if answer to either of the following is "Yes", a more complete depression screening is indicated)   Q1: Over the past two weeks, have you felt down, depressed or hopeless? {Responses; yes/no (default no):140031::"No"}  Q2: Over the past two weeks, have you felt little interest or pleasure in doing things? {Responses; yes/no (default no):140031::"No"}  Have you lost interest or pleasure in daily life? {Responses; yes/no (default no):140031::"No"}  Do you often feel hopeless? {Responses; yes/no (default no):140031::"No"}  Do you cry easily over simple problems? {Responses; yes/no (default no):140031::"No"}  Activities of Daily Living In your present state of health, do you have any difficulty performing the following activities?:  Driving? {Responses; yes/no (default no):140031::"No"} Managing money?  {Responses; yes/no (default no):140031::"No"} Feeding yourself? {Responses;  yes/no (default no):140031::"No"} Getting from bed to chair? {Responses; yes/no (default no):140031::"No"} Climbing a flight of stairs? {Responses; yes/no (default no):140031::"No"} Preparing food and eating?: {yes/no (default no):140031::"No"} Bathing or showering? {Responses; yes/no (default no):140031::"No"} Getting dressed: {yes/no (default no):140031::"No"} Getting to the toilet? {Responses; yes/no (default no):140031::"No"} Using the toilet:{yes/no (default no):140031::"No"} Moving around from place to place: {yes/no (default no):140031::"No"} In the past year have you fallen or had a near fall?:{yes/no (default no):140031::"No"}   Are you sexually active?  {yes/no:20286}  Do you have more than one partner?  {Responses; yes/no (default no):140031::"No"}  Vision Difficulties: {yes/no (default no):140031::"No"}  Hearing Difficulties: {yes/no (default no):140031::"No"} Do you often ask people to speak up or repeat themselves? {Responses; yes/no (default no):140031::"No"} Do you experience ringing or noises in your ears? {Responses; yes/no (default no):140031::"No"} Do you have difficulty understanding soft or whispered voices? {Responses; yes/no (default no):140031::"No"}  Cognition  Do you feel that you have a problem with memory?{Responses; yes/no (default no):140031::"No"}  Do you often misplace items? {Responses; yes/no (default no):140031::"No"}  Do you feel safe at home?  {yes/no:20286}  Advanced directives Does patient have a Health Care Power of Attorney? {yes/no:20286} Does patient have a Living Will? {yes/no:20286}   Objective:     Blood pressure 122/82, pulse 90, temperature 98 F (36.7 C), temperature source Oral, height  (1.778 m), weight 180 lb 3.2 oz (81.7 kg), SpO2 96 %. Body mass index is 25.86 kg/m.  General appearance: alert, no distress, WD/WN, male Cognitive Testing  Alert? Yes  Normal Appearance?Yes  Oriented to person? Yes  Place? Yes   Time?  Yes  Recall of three objects?  Yes  Can perform simple calculations? Yes  Displays appropriate judgment?Yes  Can read the correct time from a watch face?Yes  HEENT: normocephalic, sclerae anicteric, TMs pearly, nares patent, no discharge or erythema, pharynx normal Oral cavity: MMM, no lesions Neck: supple, no lymphadenopathy, no thyromegaly, no masses Heart: RRR, normal S1, S2, no murmurs Lungs: CTA bilaterally, no wheezes, rhonchi, or rales Abdomen: +bs, soft, non tender, non distended, no masses, no hepatomegaly, no splenomegaly Musculoskeletal: nontender, no swelling, no obvious deformity Extremities: no edema, no cyanosis, no clubbing Pulses: 2+ symmetric, upper and lower extremities, normal cap refill Neurological: alert, oriented x 3, CN2-12 intact, strength normal upper extremities and lower extremities, sensation normal throughout, DTRs 2+ throughout, no cerebellar signs, gait normal Testicular: B/L testicle descended. No masses Rectal/Prostate: No nodules or localized areas of softness, tenderness or induration palpated. Rectal tone normal. No external or internal hemorrhoids noted. Psychiatric: normal affect, behavior normal, pleasant   Assessment:  Patient denies any difficulties at home. No trouble with ADLs, depression or falls. No recent changes to vision or hearing. Is UTD with immunizations. Is  UTD with screening. Discussed Advanced Directives, patient agrees to bring [place] copies of documents if can. Encouraged heart healthy diet, exercise as tolerated and adequate sleep. Declines flu shot. Testicular and rectal examination done today.      Plan:   During the course of the visit the patient was educated and counseled about appropriate screening and preventive services including:    Pneumococcal vaccine   Influenza vaccine  Td vaccine  Screening electrocardiogram  Bone densitometry screening  Colorectal cancer screening  Diabetes screening  Glaucoma  screening  Nutrition counseling   Advanced directives: requested  Screening recommendations, referrals: Vaccinations: Please see documentation below and orders this visit.  Nutrition assessed and recommended  Colonoscopy {not indicated/requested/declined:14582} Recommended yearly ophthalmology/optometry visit for glaucoma screening and checkup Recommended yearly dental visit for hygiene and checkup Advanced directives - requested  Conditions/risks identified: BMI: Discussed weight loss, diet, and increase physical activity.  Increase physical activity: AHA recommends 150 minutes of physical activity a week.  Medications reviewed Diabetes {ACTION; IS/IS WUJ:81191478} at goal, ACE/ARB therapy: {Ace Inhibitor Therapy:20833} RA- on DMARD*** Urinary Incontinence {ACTION; IS/IS GNF:62130865} an issue: discussed non pharmacology and pharmacology options.  Fall risk: {Desc; low/moderate/high:110033}- discussed PT, home fall assessment, medications.    Medicare Attestation I have personally reviewed: The patient's medical and social history Their use of alcohol, tobacco or illicit drugs Their current medications and supplements The patient's functional ability including ADLs,fall risks, home safety risks, cognitive, and hearing and visual impairment Diet and physical activities Evidence for depression or mood disorders  The patient's weight, height, BMI, and visual acuity have been recorded in the chart.  I have made referrals, counseling, and provided education to the patient based on review of the above and I have provided the patient with a written personalized care plan for preventive services.     Kallie Locks, [name]   09/20/2017

## 2017-09-21 LAB — CBC WITH DIFFERENTIAL/PLATELET
Basophils Absolute: 0.1 10*3/uL (ref 0.0–0.2)
Basos: 1 %
EOS (ABSOLUTE): 0.3 10*3/uL (ref 0.0–0.4)
Eos: 7 %
Hematocrit: 43.9 % (ref 37.5–51.0)
Hemoglobin: 14 g/dL (ref 13.0–17.7)
Immature Grans (Abs): 0 10*3/uL (ref 0.0–0.1)
Immature Granulocytes: 0 %
Lymphocytes Absolute: 1.5 10*3/uL (ref 0.7–3.1)
Lymphs: 35 %
MCH: 29.5 pg (ref 26.6–33.0)
MCHC: 31.9 g/dL (ref 31.5–35.7)
MCV: 93 fL (ref 79–97)
Monocytes Absolute: 0.3 10*3/uL (ref 0.1–0.9)
Monocytes: 8 %
Neutrophils Absolute: 2.2 10*3/uL (ref 1.4–7.0)
Neutrophils: 49 %
Platelets: 234 10*3/uL (ref 150–450)
RBC: 4.74 x10E6/uL (ref 4.14–5.80)
RDW: 14.1 % (ref 12.3–15.4)
WBC: 4.4 10*3/uL (ref 3.4–10.8)

## 2017-09-21 LAB — LIPID PANEL
Chol/HDL Ratio: 3.6 ratio (ref 0.0–5.0)
Cholesterol, Total: 157 mg/dL (ref 100–199)
HDL: 44 mg/dL (ref >39)
LDL Calculated: 103 mg/dL — ABNORMAL HIGH (ref 0–99)
Triglycerides: 49 mg/dL (ref 0–149)
VLDL Cholesterol Cal: 10 mg/dL (ref 5–40)

## 2017-09-21 LAB — VITAMIN D 25 HYDROXY (VIT D DEFICIENCY, FRACTURES): Vit D, 25-Hydroxy: 24.4 ng/mL — ABNORMAL LOW (ref 30.0–100.0)

## 2017-09-22 ENCOUNTER — Telehealth: Payer: Self-pay

## 2017-09-22 NOTE — Telephone Encounter (Signed)
-----   Message from Kallie Locks, [name] sent at 09/22/2017  4:12 PM EDT ----- Regarding: "Lab Results" Lyla Son please call patient. All labs from 09/20/2017 were good.  Vitamin D is slightly low, so he should continue taking Vitamin D supplement.   Thanks,   Dorene Grebe.

## 2017-09-22 NOTE — Telephone Encounter (Signed)
Patient notified

## 2017-10-10 ENCOUNTER — Telehealth: Payer: Self-pay

## 2017-10-10 MED ORDER — ALBUTEROL SULFATE HFA 108 (90 BASE) MCG/ACT IN AERS: 1.0000 | INHALATION_SPRAY | Freq: Four times a day (QID) | RESPIRATORY_TRACT | 2 refills | Status: DC | PRN

## 2017-10-10 NOTE — Telephone Encounter (Signed)
Medication sent to pharmacy  

## 2017-10-17 ENCOUNTER — Other Ambulatory Visit: Payer: Self-pay | Admitting: Family Medicine

## 2017-10-17 DIAGNOSIS — F419 Anxiety disorder, unspecified: Secondary | ICD-10-CM

## 2017-10-17 DIAGNOSIS — J4530 Mild persistent asthma, uncomplicated: Secondary | ICD-10-CM

## 2017-10-17 MED ORDER — ALPRAZOLAM 0.5 MG PO TABS: 0.5000 mg | ORAL_TABLET | Freq: Three times a day (TID) | ORAL | 0 refills | Status: DC | PRN

## 2017-10-17 MED ORDER — ALBUTEROL SULFATE HFA 108 (90 BASE) MCG/ACT IN AERS: 2.0000 | INHALATION_SPRAY | RESPIRATORY_TRACT | 11 refills | Status: DC | PRN

## 2017-10-17 MED ORDER — ALBUTEROL SULFATE (2.5 MG/3ML) 0.083% IN NEBU: 2.5000 mg | INHALATION_SOLUTION | Freq: Three times a day (TID) | RESPIRATORY_TRACT | 11 refills | Status: DC

## 2017-10-17 NOTE — Progress Notes (Signed)
Refills for Xanax, Albuterol, and Albuterol Nebs to pharmacy.

## 2017-12-04 ENCOUNTER — Other Ambulatory Visit: Payer: Self-pay

## 2017-12-04 DIAGNOSIS — F419 Anxiety disorder, unspecified: Secondary | ICD-10-CM

## 2017-12-04 MED ORDER — ALPRAZOLAM 0.5 MG PO TABS: 0.5000 mg | ORAL_TABLET | Freq: Three times a day (TID) | ORAL | 0 refills | Status: DC | PRN

## 2017-12-06 ENCOUNTER — Other Ambulatory Visit: Payer: Self-pay

## 2017-12-06 DIAGNOSIS — F419 Anxiety disorder, unspecified: Secondary | ICD-10-CM

## 2017-12-08 ENCOUNTER — Other Ambulatory Visit: Payer: Self-pay | Admitting: Family Medicine

## 2017-12-08 DIAGNOSIS — F419 Anxiety disorder, unspecified: Secondary | ICD-10-CM

## 2017-12-08 MED ORDER — ALPRAZOLAM 0.5 MG PO TABS: 0.5000 mg | ORAL_TABLET | Freq: Three times a day (TID) | ORAL | 0 refills | Status: DC | PRN

## 2017-12-22 ENCOUNTER — Encounter: Payer: Self-pay | Admitting: Family Medicine

## 2017-12-22 ENCOUNTER — Ambulatory Visit (INDEPENDENT_AMBULATORY_CARE_PROVIDER_SITE_OTHER): Payer: Self-pay | Admitting: Family Medicine

## 2017-12-22 VITALS — BP 136/68 | HR 92 | Temp 98.4°F | Ht 70.0 in | Wt 182.0 lb

## 2017-12-22 DIAGNOSIS — J45909 Unspecified asthma, uncomplicated: Secondary | ICD-10-CM

## 2017-12-22 DIAGNOSIS — Z09 Encounter for follow-up examination after completed treatment for conditions other than malignant neoplasm: Secondary | ICD-10-CM

## 2017-12-22 DIAGNOSIS — K219 Gastro-esophageal reflux disease without esophagitis: Secondary | ICD-10-CM

## 2017-12-22 MED ORDER — MONTELUKAST SODIUM 10 MG PO TABS: 10.0000 mg | ORAL_TABLET | Freq: Every day | ORAL | 6 refills | Status: DC

## 2017-12-22 MED ORDER — RANITIDINE HCL 150 MG PO CAPS: 150.0000 mg | ORAL_CAPSULE | Freq: Every evening | ORAL | 3 refills | Status: DC

## 2017-12-22 MED ORDER — MONTELUKAST SODIUM 10 MG PO TABS: 10.0000 mg | ORAL_TABLET | Freq: Every day | ORAL | 9 refills | Status: DC

## 2017-12-22 NOTE — Progress Notes (Signed)
Follow Up  Subjective:    Patient ID: Aaron Fusarrick L [name], male    DOB: Nov 15, 1970, 47 y.o.   MRN: [id number]   Chief Complaint  Patient presents with  . Follow-up    3 month on health maintence    HPI  Aaron [name] is a [age] year old male with a past medical history of Asthma. He is here today for follow up.   Current Status: Since his last office visit, he is doing well with no complaints. He is accompanied today by he significant other. He has occasional shortness of breath, but continues to use Albuterol inhaler and nebs as needed. No chest pain, heart palpitations, and cough reported. He reports acid reflux, especially at night.   He has c/o of generalized joint pain.   He denies fevers, chills, fatigue, recent infections, weight loss, and night sweats.   He has not had any headaches, visual changes, dizziness, and falls.   No reports of GI problems such as nausea, vomiting, diarrhea, and constipation. He has no reports of blood in stools, dysuria and hematuria.   No depression or anxiety reported.   Past Medical History:  Diagnosis Date  . Asthma     Family History  Problem Relation Age of Onset  . Diabetes Mother   . Hypertension Father     Social History   Socioeconomic History  . Marital status: Married    Spouse name: Not on file  . Number of children: Not on file  . Years of education: Not on file  . Highest education level: Not on file  Occupational History  . Not on file  Social Needs  . Financial resource strain: Not on file  . Food insecurity:    Worry: Not on file    Inability: Not on file  . Transportation needs:    Medical: Not on file    Non-medical: Not on file  Tobacco Use  . Smoking status: Current Every Day Smoker    Packs/day: 0.50    Types: Cigarettes  . Smokeless tobacco: Never Used  Substance and Sexual Activity  . Alcohol use: Yes    Comment: occasion  . Drug use: Yes    Types: Marijuana  . Sexual activity: Yes  Lifestyle  .  Physical activity:    Days per week: Not on file    Minutes per session: Not on file  . Stress: Not on file  Relationships  . Social connections:    Talks on phone: Not on file    Gets together: Not on file    Attends religious service: Not on file    Active member of club or organization: Not on file    Attends meetings of clubs or organizations: Not on file    Relationship status: Not on file  . Intimate partner violence:    Fear of current or ex partner: Not on file    Emotionally abused: Not on file    Physically abused: Not on file    Forced sexual activity: Not on file  Other Topics Concern  . Not on file  Social History Narrative   ** Merged History Encounter **        Past Surgical History:  Procedure Laterality Date  . ABDOMINAL SURGERY    . HERNIA REPAIR      Immunization History  Administered Date(s) Administered  . Influenza,inj,Quad PF,6+ Mos 02/25/2017  . Pneumococcal Polysaccharide-23 02/25/2017  . Tdap 03/15/2017    Current Meds  Medication Sig  .  albuterol (PROVENTIL HFA;VENTOLIN HFA) 108 (90 Base) MCG/ACT inhaler Inhale 2 puffs into the lungs every 4 (four) hours as needed for wheezing or shortness of breath.  Marland Kitchen albuterol (PROVENTIL) (2.5 MG/3ML) 0.083% nebulizer solution Take 3 mLs (2.5 mg total) by nebulization 3 (three) times daily.  Marland Kitchen albuterol (PROVENTIL) (5 MG/ML) 0.5% nebulizer solution Take 0.5 mLs (2.5 mg total) by nebulization every 2 (two) hours as needed for wheezing or shortness of breath.  . ALPRAZolam (XANAX) 0.5 MG tablet Take 1 tablet (0.5 mg total) by mouth every 8 (eight) hours as needed for anxiety or sleep.  . cetirizine (ZYRTEC) 10 MG tablet Take 1 tablet (10 mg total) by mouth daily.  . cholecalciferol (VITAMIN D) 1000 units tablet Take 1,000 Units by mouth daily.  . [DISCONTINUED] albuterol (PROVENTIL HFA;VENTOLIN HFA) 108 (90 Base) MCG/ACT inhaler Inhale 1-2 puffs into the lungs every 6 (six) hours as needed for wheezing or  shortness of breath.   Allergies  Allergen Reactions  . Keflex [Cephalexin] Diarrhea, Nausea And Vomiting and Rash    hallucinations    BP 136/68 (BP Location: Left Arm, Patient Position: Sitting, Cuff Size: Large)   Pulse 92   Temp 98.4 F (36.9 C) (Oral)   Ht 5\' 10"  (1.778 m)   Wt 182 lb (82.6 kg)   SpO2 96%   BMI 26.11 kg/m   Review of Systems  Constitutional: Negative.   HENT: Negative.   Eyes: Negative.   Respiratory: Positive for shortness of breath (occasional ).   Cardiovascular: Negative.   Endocrine: Negative.   Genitourinary: Negative.   Musculoskeletal: Positive for arthralgias (shoulder ).  Skin: Negative.   Allergic/Immunologic: Negative.   Neurological: Positive for numbness (shoulder ).  Hematological: Negative.   Psychiatric/Behavioral: Negative.    Objective:   Physical Exam  Constitutional: He is oriented to person, place, and time. He appears well-developed and well-nourished.  HENT:  Head: Normocephalic and atraumatic.  Right Ear: External ear normal.  Left Ear: External ear normal.  Nose: Nose normal.  Mouth/Throat: Oropharynx is clear and moist.  Eyes: Pupils are equal, round, and reactive to light. Conjunctivae and EOM are normal.  Neck: Normal range of motion. Neck supple.  Cardiovascular: Normal rate, regular rhythm, normal heart sounds and intact distal pulses.  Pulmonary/Chest: Effort normal and breath sounds normal.  Abdominal: Soft. Bowel sounds are normal.  Musculoskeletal: Normal range of motion.  Neurological: He is alert and oriented to person, place, and time.  Skin: Skin is warm and dry. Capillary refill takes less than 2 seconds.  Psychiatric: He has a normal mood and affect. His behavior is normal. Judgment and thought content normal.  Nursing note and vitals reviewed.  Assessment & Plan:   1. Asthma, unspecified asthma severity, unspecified whether complicated, unspecified whether persistent Stable today. He will continue  Albuterol inhalers, nebs, Zyrtec, and Singulair as prescribed.  - montelukast (SINGULAIR) 10 MG tablet; Take 1 tablet (10 mg total) by mouth at bedtime.  Dispense: 30 tablet; Refill: 6  2. Gastroesophageal reflux disease without esophagitis We will initiate Ranitidine today.  - ranitidine (ZANTAC) 150 MG capsule; Take 1 capsule (150 mg total) by mouth every evening.  Dispense: 30 capsule; Refill: 3  3. Follow up He will follow up in 3 months.   Meds ordered this encounter  Medications  . ranitidine (ZANTAC) 150 MG capsule    Sig: Take 1 capsule (150 mg total) by mouth every evening.    Dispense:  30 capsule  Refill:  3  . DISCONTD: montelukast (SINGULAIR) 10 MG tablet    Sig: Take 1 tablet (10 mg total) by mouth at bedtime.    Dispense:  30 tablet    Refill:  9  . montelukast (SINGULAIR) 10 MG tablet    Sig: Take 1 tablet (10 mg total) by mouth at bedtime.    Dispense:  30 tablet    Refill:  6    Raliegh Ip,  MSN, [facility] Patient [facility] [address] [place], [place] [address] [phone]

## 2017-12-22 NOTE — Patient Instructions (Signed)
Montelukast oral tablets What is this medicine? MONTELUKAST (mon te LOO kast) is used to prevent and treat the symptoms of asthma. It is also used to treat allergies. Do not use for an acute asthma attack. This medicine may be used for other purposes; ask your health care provider or pharmacist if you have questions. COMMON BRAND NAME(S): Singulair What should I tell my health care provider before I take this medicine? They need to know if you have any of these conditions: -liver disease -an unusual or allergic reaction to montelukast, other medicines, foods, dyes, or preservatives -pregnant or trying to get pregnant -breast-feeding How should I use this medicine? This medicine should be given by mouth. Follow the directions on the prescription label. Take this medicine at the same time every day. You may take this medicine with or without meals. Do not chew the tablets. Do not stop taking your medicine unless your doctor tells you to. Talk to your pediatrician regarding the use of this medicine in children. Special care may be needed. While this drug may be prescribed for children as young as 15 years of age for selected conditions, precautions do apply. Overdosage: If you think you have taken too much of this medicine contact a poison control center or emergency room at once. NOTE: This medicine is only for you. Do not share this medicine with others. What if I miss a dose? If you miss a dose, take it as soon as you can. If it is almost time for your next dose, take only that dose. Do not take double or extra doses. What may interact with this medicine? -anti-infectives like rifampin and rifabutin -medicines for diabetes like rosiglitazone and repaglinide -medicines for seizures like phenytoin, phenobarbital, and carbamazepine -paclitaxel This list may not describe all possible interactions. Give your health care provider a list of all the medicines, herbs, non-prescription drugs, or  dietary supplements you use. Also tell them if you smoke, drink alcohol, or use illegal drugs. Some items may interact with your medicine. What should I watch for while using this medicine? Visit your doctor or health care professional for regular checks on your progress. Tell your doctor or health care professional if your allergy or asthma symptoms do not improve. Take your medicine even when you do not have symptoms. Do not stop taking any of your medicine(s) unless your doctor tells you to. If you have asthma, talk to your doctor about what to do in an acute asthma attack. Always have your inhaled rescue medicine for asthma attacks with you. Patients and their families should watch for new or worsening thoughts of suicide or depression. Also watch for sudden changes in feelings such as feeling anxious, agitated, panicky, irritable, hostile, aggressive, impulsive, severely restless, overly excited and hyperactive, or not being able to sleep. Any worsening of mood or thoughts of suicide or dying should be reported to your health care professional right away. What side effects may I notice from receiving this medicine? Side effects that you should report to your doctor or health care professional as soon as possible: -allergic reactions like skin rash or hives, or swelling of the face, lips, or tongue -breathing problems -confusion -dark urine -fever or infection -flu-like symptoms -hallucinations -painful lumps under the skin -pain, tingling, numbness in the hands or feet -sinus pain or swelling -suicidal thoughts or other mood changes -tremors -trouble sleeping -uncontrolled muscle movements -unusual bleeding or bruising -yellowing of the eyes or skin Side effects that usually do not require   medical attention (report to your doctor or health care professional if they continue or are bothersome): -cough -dizziness -drowsiness -headache -nightmares -stomach upset -stuffy nose This list may  not describe all possible side effects. Call your doctor for medical advice about side effects. You may report side effects to FDA at 1-800-FDA-1088. Where should I keep my medicine? Keep out of the reach of children. Store at room temperature between 15 and 30 degrees C (59 and 86 degrees F). Protect from light and moisture. Keep this medicine in the original bottle. Throw away any unused medicine after the expiration date. NOTE: This sheet is a summary. It may not cover all possible information. If you have questions about this medicine, talk to your doctor, pharmacist, or health care provider.  2018 Elsevier/Gold Standard (2015-04-20 09:40:44) Calcium; Vitamin D oral tablets What is this medicine? CALCIUM; VITAMIN D (KAL see um; VYE ta min D) is a vitamin supplement. It is used to prevent conditions of low calcium and vitamin D. This medicine may be used for other purposes; ask your health care provider or pharmacist if you have questions. COMMON BRAND NAME(S): Calcarb 600 with Vitamin D, Calcet Plus Vitamin D, Calcitrate + D, Calcium Citrate + D3 Maximum, Calcium Citrate Maximum with D, Caltrate, Caltrate 600+D, Citracal + D, Citracal MAXIMUM + D, Citracal Petites with Vitamin D, Citrus Calcium Plus D, OSCAL 500 + D, OSCAL Calcium + D3, OSCAL Extra D3, Osteo-Poretical, Oysco 500 + D, Oysco D, Oystercal-D Calcium What should I tell my health care provider before I take this medicine? They need to know if you have any of these conditions: -constipation -dehydration -heart disease -high level of calcium or vitamin D in the blood -high level of phosphate in the blood -kidney disease -kidney stones -liver disease -parathyroid disease -sarcoidosis -stomach ulcer or obstruction -an unusual or allergic reaction to calcium, vitamin D, tartrazine dye, other medicines, foods, dyes, or preservatives -pregnant or trying to get pregnant -breast-feeding How should I use this medicine? Take this  medicine by mouth with a glass of water. Follow the directions on the label. Take with food or within 1 hour after a meal. Take your medicine at regular intervals. Do not take your medicine more often than directed. Talk to your pediatrician regarding the use of this medicine in children. While this medicine may be used in children for selected conditions, precautions do apply. Overdosage: If you think you have taken too much of this medicine contact a poison control center or emergency room at once. NOTE: This medicine is only for you. Do not share this medicine with others. What if I miss a dose? If you miss a dose, take it as soon as you can. If it is almost time for your next dose, take only that dose. Do not take double or extra doses. What may interact with this medicine? Do not take this medicine with any of the following medications: -ammonium chloride -methenamine This medicine may also interact with the following medications: -antibiotics like ciprofloxacin, gatifloxacin, tetracycline -captopril -delavirdine -diuretics -gabapentin -iron supplements -medicines for fungal infections like ketoconazole and itraconazole -medicines for seizures like ethotoin and phenytoin -mineral oil -mycophenolate -other vitamins with calcium, vitamin D, or minerals -quinidine -rosuvastatin -sucralfate -thyroid medicine This list may not describe all possible interactions. Give your health care provider a list of all the medicines, herbs, non-prescription drugs, or dietary supplements you use. Also tell them if you smoke, drink alcohol, or use illegal drugs. Some items may interact with  your medicine. What should I watch for while using this medicine? Taking this medicine is not a substitute for a well-balanced diet and exercise. Talk with your doctor or health care provider and follow a healthy lifestyle. Do not take this medicine with high-fiber foods, large amounts of alcohol, or drinks  containing caffeine. Do not take this medicine within 2 hours of any other medicines. What side effects may I notice from receiving this medicine? Side effects that you should report to your doctor or health care professional as soon as possible: -allergic reactions like skin rash, itching or hives, swelling of the face, lips, or tongue -confusion -dry mouth -high blood pressure -increased hunger or thirst -increased urination -irregular heartbeat -metallic taste -muscle or bone pain -pain when urinating -seizure -unusually weak or tired -weight loss Side effects that usually do not require medical attention (report to your doctor or health care professional if they continue or are bothersome): -constipation -diarrhea -headache -loss of appetite -nausea, vomiting -stomach upset This list may not describe all possible side effects. Call your doctor for medical advice about side effects. You may report side effects to FDA at 1-800-FDA-1088. Where should I keep my medicine? Keep out of the reach of children. Store at room temperature between 15 and 30 degrees C (59 and 86 degrees F). Protect from light. Keep container tightly closed. Throw away any unused medicine after the expiration date. NOTE: This sheet is a summary. It may not cover all possible information. If you have questions about this medicine, talk to your doctor, pharmacist, or health care provider.  2018 Elsevier/Gold Standard (2007-08-01 17:56:23)

## 2018-01-22 ENCOUNTER — Other Ambulatory Visit: Payer: Self-pay | Admitting: Family Medicine

## 2018-01-22 DIAGNOSIS — F419 Anxiety disorder, unspecified: Secondary | ICD-10-CM

## 2018-01-25 ENCOUNTER — Other Ambulatory Visit: Payer: Self-pay | Admitting: Family Medicine

## 2018-01-25 ENCOUNTER — Telehealth: Payer: Self-pay

## 2018-01-25 DIAGNOSIS — J4530 Mild persistent asthma, uncomplicated: Secondary | ICD-10-CM

## 2018-01-25 MED ORDER — ALBUTEROL SULFATE HFA 108 (90 BASE) MCG/ACT IN AERS: 2.0000 | INHALATION_SPRAY | RESPIRATORY_TRACT | 11 refills | Status: DC | PRN

## 2018-01-25 NOTE — Telephone Encounter (Signed)
-----   Message from Kallie Locks, [name] sent at 01/25/2018  3:51 PM EDT ----- Regarding: "Refill" Aaron [name],   Please inform patient that refill for Albuterol Inhaler was sent to pharmacy.    Thank you.

## 2018-01-25 NOTE — Telephone Encounter (Signed)
Patient states that he lost his inhaler and pharmacy want fill another one because it's to soon. Please advise!  Patient is aware that you are out of office.

## 2018-01-25 NOTE — Progress Notes (Signed)
Rx for Albuterol Inhaler sent to pharmacy today.

## 2018-01-25 NOTE — Telephone Encounter (Signed)
Patient notified

## 2018-01-26 ENCOUNTER — Other Ambulatory Visit: Payer: Self-pay

## 2018-01-26 DIAGNOSIS — J4530 Mild persistent asthma, uncomplicated: Secondary | ICD-10-CM

## 2018-01-26 MED ORDER — ALBUTEROL SULFATE HFA 108 (90 BASE) MCG/ACT IN AERS: 2.0000 | INHALATION_SPRAY | RESPIRATORY_TRACT | 11 refills | Status: DC | PRN

## 2018-02-13 ENCOUNTER — Encounter: Payer: Self-pay | Admitting: Family Medicine

## 2018-02-13 ENCOUNTER — Ambulatory Visit (INDEPENDENT_AMBULATORY_CARE_PROVIDER_SITE_OTHER): Payer: Self-pay | Admitting: Family Medicine

## 2018-02-13 VITALS — BP 123/80 | HR 77 | Temp 98.1°F | Resp 16 | Ht 70.0 in | Wt 180.0 lb

## 2018-02-13 DIAGNOSIS — R0602 Shortness of breath: Secondary | ICD-10-CM

## 2018-02-13 DIAGNOSIS — R059 Cough, unspecified: Secondary | ICD-10-CM

## 2018-02-13 DIAGNOSIS — J4530 Mild persistent asthma, uncomplicated: Secondary | ICD-10-CM

## 2018-02-13 DIAGNOSIS — K2100 Gastro-esophageal reflux disease with esophagitis, without bleeding: Secondary | ICD-10-CM

## 2018-02-13 DIAGNOSIS — R05 Cough: Secondary | ICD-10-CM

## 2018-02-13 DIAGNOSIS — Z09 Encounter for follow-up examination after completed treatment for conditions other than malignant neoplasm: Secondary | ICD-10-CM

## 2018-02-13 DIAGNOSIS — Z23 Encounter for immunization: Secondary | ICD-10-CM

## 2018-02-13 DIAGNOSIS — Z716 Tobacco abuse counseling: Secondary | ICD-10-CM

## 2018-02-13 DIAGNOSIS — F419 Anxiety disorder, unspecified: Secondary | ICD-10-CM

## 2018-02-13 MED ORDER — OMEPRAZOLE 20 MG PO CPDR: 20.0000 mg | DELAYED_RELEASE_CAPSULE | Freq: Every day | ORAL | 3 refills | Status: DC

## 2018-02-13 MED ORDER — FLUTICASONE PROPIONATE HFA 110 MCG/ACT IN AERO: 2.0000 | INHALATION_SPRAY | Freq: Two times a day (BID) | RESPIRATORY_TRACT | 11 refills | Status: DC

## 2018-02-13 NOTE — Progress Notes (Signed)
Sick Visit  Subjective:    Patient ID: Aaron [name], male    DOB: 08/13/1970, 47 y.o.   MRN: [id number]   Chief Complaint  Patient presents with  . Medication Refill  . Asthma    ASTHMA FLARE UPS IN THE LAST 3 WEEKS   . Shortness of Breath   HPI  Aaron [name] is a 47 year old male with a past medical history of Asthma. He is here today for Sick Visit.   Current Status: Since his last office visit, he states that he has had about 3 bad attacks weekly r/t Asthma. He continues to smoke 1 pack a day for 3 days. Denies chest pain, and heart palpitations. He has not been able to afford Singulair.  He denies fevers, chills, fatigue, recent infections, weight loss, and night sweats. He has not had any headaches, visual changes, dizziness, and falls. No reports of GI problems such as nausea, vomiting, diarrhea, and constipation. H has no reports of blood in stools, dysuria and hematuria. No depression or anxiety, and denies suicidal ideations, homicidal ideations, or auditory hallucinations. He denies pain today.   Past Medical History:  Diagnosis Date  . Asthma     Family History  Problem Relation Age of Onset  . Diabetes Mother   . Hypertension Father     Social History   Socioeconomic History  . Marital status: Married    Spouse name: Not on file  . Number of children: Not on file  . Years of education: Not on file  . Highest education level: Not on file  Occupational History  . Not on file  Social Needs  . Financial resource strain: Not on file  . Food insecurity:    Worry: Not on file    Inability: Not on file  . Transportation needs:    Medical: Not on file    Non-medical: Not on file  Tobacco Use  . Smoking status: Current Every Day Smoker    Packs/day: 0.50    Types: Cigarettes  . Smokeless tobacco: Never Used  Substance and Sexual Activity  . Alcohol use: Yes    Comment: occasion  . Drug use: Yes    Types: Marijuana  . Sexual activity: Yes  Lifestyle  .  Physical activity:    Days per week: Not on file    Minutes per session: Not on file  . Stress: Not on file  Relationships  . Social connections:    Talks on phone: Not on file    Gets together: Not on file    Attends religious service: Not on file    Active member of club or organization: Not on file    Attends meetings of clubs or organizations: Not on file    Relationship status: Not on file  . Intimate partner violence:    Fear of current or ex partner: Not on file    Emotionally abused: Not on file    Physically abused: Not on file    Forced sexual activity: Not on file  Other Topics Concern  . Not on file  Social History Narrative   ** Merged History Encounter **        Past Surgical History:  Procedure Laterality Date  . ABDOMINAL SURGERY    . HERNIA REPAIR      Immunization History  Administered Date(s) Administered  . Influenza,inj,Quad PF,6+ Mos 02/25/2017, 02/13/2018  . Pneumococcal Polysaccharide-23 02/25/2017  . Tdap 03/15/2017    Current Meds  Medication Sig  .  albuterol (PROVENTIL HFA;VENTOLIN HFA) 108 (90 Base) MCG/ACT inhaler Inhale 2 puffs into the lungs every 4 (four) hours as needed for wheezing or shortness of breath.  Marland Kitchen albuterol (PROVENTIL) (2.5 MG/3ML) 0.083% nebulizer solution Take 3 mLs (2.5 mg total) by nebulization 3 (three) times daily.  Marland Kitchen albuterol (PROVENTIL) (5 MG/ML) 0.5% nebulizer solution Take 0.5 mLs (2.5 mg total) by nebulization every 2 (two) hours as needed for wheezing or shortness of breath.  . cetirizine (ZYRTEC) 10 MG tablet Take 1 tablet (10 mg total) by mouth daily.  . cholecalciferol (VITAMIN D) 1000 units tablet Take 1,000 Units by mouth daily.  . fluticasone (FLOVENT HFA) 110 MCG/ACT inhaler Inhale 2 puffs into the lungs 2 (two) times daily.  . [DISCONTINUED] fluticasone (FLOVENT HFA) 110 MCG/ACT inhaler Inhale 2 puffs into the lungs 2 (two) times daily.   Allergies  Allergen Reactions  . Keflex [Cephalexin] Diarrhea,  Nausea And Vomiting and Rash    hallucinations   BP 123/80 (BP Location: Left Arm, Patient Position: Sitting, Cuff Size: Normal)   Pulse 77   Temp 98.1 F (36.7 C) (Oral)   Resp 16   Ht 5\' 10"  (1.778 m)   Wt 180 lb (81.6 kg)   SpO2 99%   BMI 25.83 kg/m   Review of Systems  Constitutional: Negative.   HENT: Negative.   Respiratory: Positive for cough and shortness of breath (frequent).   Cardiovascular: Negative.   Gastrointestinal: Negative.   Genitourinary: Negative.   Musculoskeletal: Negative.   Skin: Negative.   Allergic/Immunologic: Negative.   Neurological: Negative.   Hematological: Negative.   Psychiatric/Behavioral: Negative.    Objective:   Physical Exam  Constitutional: He is oriented to person, place, and time. He appears well-developed and well-nourished.  HENT:  Head: Normocephalic.  Eyes: Pupils are equal, round, and reactive to light. EOM are normal.  Neck: Normal range of motion. Neck supple.  Cardiovascular: Normal rate and normal heart sounds.  Pulmonary/Chest: Effort normal.  Musculoskeletal:       Right lower leg: Normal.       Left lower leg: Normal.  Neurological: He is alert and oriented to person, place, and time.  Skin: Skin is warm.  Psychiatric: He has a normal mood and affect. His behavior is normal.  Nursing note and vitals reviewed.  Assessment & Plan:   1. Mild persistent asthma without complication He will begin to use Flovent as prescribed. Asthma medications reviewed with patient for effective use. He will take all other medications as prescribed. We will re-assess at next office appointment.  - fluticasone (FLOVENT HFA) 110 MCG/ACT inhaler; Inhale 2 puffs into the lungs 2 (two) times daily.  Dispense: 1 Inhaler; Refill: 11  2. Gastroesophageal reflux disease with esophagitis He will discontinue Ranitidine and we will initiate Prilosec.  - omeprazole (PRILOSEC) 20 MG capsule; Take 1 capsule (20 mg total) by mouth daily.   Dispense: 30 capsule; Refill: 3  3. Anxiety Stable.   4. Tobacco abuse counseling  5. Cough Stable today.   6. Shortness of breath  7. Follow up He will follow up in 1 month.  Meds ordered this encounter  Medications  . fluticasone (FLOVENT HFA) 110 MCG/ACT inhaler    Sig: Inhale 2 puffs into the lungs 2 (two) times daily.    Dispense:  1 Inhaler    Refill:  11  . omeprazole (PRILOSEC) 20 MG capsule    Sig: Take 1 capsule (20 mg total) by mouth daily.    Dispense:  30 capsule    Refill:  3   Raliegh Ip,  MSN, [facility] Patient [facility] [address] [place], [place] [address] [phone]

## 2018-02-13 NOTE — Patient Instructions (Signed)
Omeprazole capsules (sprinkle caps) - Rx What is this medicine? OMEPRAZOLE (oh ME pray zol) prevents the production of acid in the stomach. It is used to treat gastroesophageal reflux disease (GERD), ulcers, certain bacteria in the stomach, inflammation of the esophagus, and Zollinger-Ellison Syndrome. It is also used to treat other conditions that cause too much stomach acid. This medicine may be used for other purposes; ask your health care provider or pharmacist if you have questions. COMMON BRAND NAME(S): Prilosec What should I tell my health care provider before I take this medicine? They need to know if you have any of these conditions: -liver disease -low levels of magnesium in the blood -lupus -an unusual or allergic reaction to omeprazole, other medicines, foods, dyes, or preservatives -pregnant or trying to get pregnant -breast-feeding How should I use this medicine? Take this medicine by mouth with a glass of water. Follow the directions on the prescription label. Do not crush, break or chew the capsules. They can be opened and the contents sprinkled on a small amount of applesauce or yogurt, given with fruit juices, or swallowed immediately with water. This medicine works best if taken on an empty stomach 30 to 60 minutes before breakfast. Take your doses at regular intervals. Do not take your medicine more often than directed. Talk to your pediatrician regarding the use of this medicine in children. Special care may be needed. Overdosage: If you think you have taken too much of this medicine contact a poison control center or emergency room at once. NOTE: This medicine is only for you. Do not share this medicine with others. What if I miss a dose? If you miss a dose, take it as soon as you can. If it is almost time for your next dose, take only that dose. Do not take double or extra doses. What may interact with this medicine? Do not take this medicine with any of the following  medications: -atazanavir -clopidogrel -nelfinavir This medicine may also interact with the following medications: -ampicillin -certain medicines for anxiety or sleep -certain medicines that treat or prevent blood clots like warfarin -cyclosporine -diazepam -digoxin -disulfiram -diuretics -iron salts -methotrexate -mycophenolate mofetil -phenytoin -prescription medicine for fungal or yeast infection like itraconazole, ketoconazole, voriconazole -saquinavir -tacrolimus This list may not describe all possible interactions. Give your health care provider a list of all the medicines, herbs, non-prescription drugs, or dietary supplements you use. Also tell them if you smoke, drink alcohol, or use illegal drugs. Some items may interact with your medicine. What should I watch for while using this medicine? It can take several days before your stomach pain gets better. Check with your doctor or health care professional if your condition does not start to get better, or if it gets worse. You may need blood work done while you are taking this medicine. What side effects may I notice from receiving this medicine? Side effects that you should report to your doctor or health care professional as soon as possible: -allergic reactions like skin rash, itching or hives, swelling of the face, lips, or tongue -bone, muscle or joint pain -breathing problems -chest pain or chest tightness -dark yellow or brown urine -dizziness -fast, irregular heartbeat -feeling faint or lightheaded -fever or sore throat -muscle spasm -palpitations -rash on cheeks or arms that gets worse in the sun -redness, blistering, peeling or loosening of the skin, including inside the mouth -seizures -tremors -unusual bleeding or bruising -unusually weak or tired -yellowing of the eyes or skin Side effects  that usually do not require medical attention (report to your doctor or health care professional if they continue or  are bothersome): -constipation -diarrhea -dry mouth -headache -nausea This list may not describe all possible side effects. Call your doctor for medical advice about side effects. You may report side effects to FDA at 1-800-FDA-1088. Where should I keep my medicine? Keep out of the reach of children. Store at room temperature between 15 and 30 degrees C (59 and 86 degrees F). Protect from light and moisture. Throw away any unused medicine after the expiration date. NOTE: This sheet is a summary. It may not cover all possible information. If you have questions about this medicine, talk to your doctor, pharmacist, or health care provider.  2018 Elsevier/Gold Standard (2015-05-21 12:18:47)  

## 2018-02-15 MED FILL — ALBUTEROL 0.083% INHAL SOLN: (2.5 MG/3ML | 10 days supply | Qty: 90 | Fill #0

## 2018-02-15 MED FILL — raNITIdine HCL 150 MG TABS: 150 | 30 days supply | Qty: 30 | Fill #0

## 2018-02-15 MED FILL — OMEPRAZOLE 20 MG CAP: 20 | 30 days supply | Qty: 30 | Fill #0

## 2018-02-15 MED FILL — MONTELUKAST SOD 10 MG TAB: 10 | 30 days supply | Qty: 30 | Fill #0

## 2018-02-15 MED FILL — FLOVENT HFA 110 MCG INHALER: 110 | 30 days supply | Qty: 12 | Fill #0

## 2018-02-15 MED FILL — ALBUTEROL SULFATE HFA 108 (: 108 (90 BAS | 16 days supply | Qty: 18 | Fill #0

## 2018-02-16 ENCOUNTER — Encounter (HOSPITAL_COMMUNITY): Payer: Self-pay | Admitting: Emergency Medicine

## 2018-02-16 ENCOUNTER — Emergency Department (HOSPITAL_COMMUNITY): Payer: Self-pay

## 2018-02-16 ENCOUNTER — Other Ambulatory Visit: Payer: Self-pay

## 2018-02-16 ENCOUNTER — Observation Stay (HOSPITAL_COMMUNITY)
Admission: EM | Admit: 2018-02-16 | Discharge: 2018-02-17 | Disposition: A | Discharge status: Home / Self Care | Payer: Self-pay | Attending: Internal Medicine | Admitting: Internal Medicine

## 2018-02-16 DIAGNOSIS — Z79899 Other long term (current) drug therapy: Secondary | ICD-10-CM | POA: Insufficient documentation

## 2018-02-16 DIAGNOSIS — K219 Gastro-esophageal reflux disease without esophagitis: Secondary | ICD-10-CM | POA: Insufficient documentation

## 2018-02-16 DIAGNOSIS — F419 Anxiety disorder, unspecified: Secondary | ICD-10-CM | POA: Insufficient documentation

## 2018-02-16 DIAGNOSIS — J4541 Moderate persistent asthma with (acute) exacerbation: Secondary | ICD-10-CM

## 2018-02-16 DIAGNOSIS — J45901 Unspecified asthma with (acute) exacerbation: Secondary | ICD-10-CM

## 2018-02-16 DIAGNOSIS — Z720 Tobacco use: Secondary | ICD-10-CM

## 2018-02-16 DIAGNOSIS — F17210 Nicotine dependence, cigarettes, uncomplicated: Secondary | ICD-10-CM | POA: Insufficient documentation

## 2018-02-16 DIAGNOSIS — J4551 Severe persistent asthma with (acute) exacerbation: Secondary | ICD-10-CM | POA: Insufficient documentation

## 2018-02-16 DIAGNOSIS — J9601 Acute respiratory failure with hypoxia: Principal | ICD-10-CM | POA: Diagnosis present

## 2018-02-16 DIAGNOSIS — E876 Hypokalemia: Secondary | ICD-10-CM | POA: Insufficient documentation

## 2018-02-16 DIAGNOSIS — J441 Chronic obstructive pulmonary disease with (acute) exacerbation: Secondary | ICD-10-CM | POA: Insufficient documentation

## 2018-02-16 HISTORY — DX: Chronic obstructive pulmonary disease, unspecified: J44.9

## 2018-02-16 LAB — I-STAT CHEM 8, ED
BUN: 18 mg/dL (ref 6–20)
Calcium, Ion: 1.12 mmol/L — ABNORMAL LOW (ref 1.15–1.40)
Chloride: 103 mmol/L (ref 98–111)
Creatinine, Ser: 1.2 mg/dL (ref 0.61–1.24)
Glucose, Bld: 112 mg/dL — ABNORMAL HIGH (ref 70–99)
HEMATOCRIT: 44 % (ref 39.0–52.0)
Hemoglobin: 15 g/dL (ref 13.0–17.0)
Potassium: 3.2 mmol/L — ABNORMAL LOW (ref 3.5–5.1)
SODIUM: 141 mmol/L (ref 135–145)
TCO2: 30 mmol/L (ref 22–32)

## 2018-02-16 LAB — COMPREHENSIVE METABOLIC PANEL
ALBUMIN: 4.4 g/dL (ref 3.5–5.0)
ALT: 24 U/L (ref 0–44)
AST: 27 U/L (ref 15–41)
Alkaline Phosphatase: 81 U/L (ref 38–126)
Anion gap: 7 (ref 5–15)
BUN: 13 mg/dL (ref 6–20)
CHLORIDE: 106 mmol/L (ref 98–111)
CO2: 28 mmol/L (ref 22–32)
Calcium: 8.8 mg/dL — ABNORMAL LOW (ref 8.9–10.3)
Creatinine, Ser: 1.25 mg/dL — ABNORMAL HIGH (ref 0.61–1.24)
GFR calc Af Amer: >60 mL/min (ref >60)
GFR calc non Af Amer: >60 mL/min (ref >60)
GLUCOSE: 119 mg/dL — AB (ref 70–99)
Potassium: 3 mmol/L — ABNORMAL LOW (ref 3.5–5.1)
Sodium: 141 mmol/L (ref 135–145)
Total Bilirubin: 0.7 mg/dL (ref 0.3–1.2)
Total Protein: 7.3 g/dL (ref 6.5–8.1)

## 2018-02-16 LAB — I-STAT ARTERIAL BLOOD GAS, ED
Acid-base deficit: 1 mmol/L (ref 0.0–2.0)
BICARBONATE: 25.1 mmol/L (ref 20.0–28.0)
O2 SAT: 99 %
PO2 ART: 121 mmHg — AB (ref 83.0–108.0)
Patient temperature: 97
TCO2: 26 mmol/L (ref 22–32)
pCO2 arterial: 43.2 mmHg (ref 32.0–48.0)
pH, Arterial: 7.369 (ref 7.350–7.450)

## 2018-02-16 LAB — CBC
HCT: 44.3 % (ref 39.0–52.0)
Hemoglobin: 14.3 g/dL (ref 13.0–17.0)
MCH: 30 pg (ref 26.0–34.0)
MCHC: 32.3 g/dL (ref 30.0–36.0)
MCV: 92.9 fL (ref 80.0–100.0)
NRBC: 0 % (ref 0.0–0.2)
PLATELETS: 211 10*3/uL (ref 150–400)
RBC: 4.77 MIL/uL (ref 4.22–5.81)
RDW: 13.1 % (ref 11.5–15.5)
WBC: 7.3 10*3/uL (ref 4.0–10.5)

## 2018-02-16 MED ORDER — PANTOPRAZOLE SODIUM 40 MG PO TBEC
40.0000 mg | DELAYED_RELEASE_TABLET | Freq: Every day | ORAL | Status: DC
  Administered 2018-02-16 – 2018-02-17 (×2): 40 mg via ORAL
  Filled 2018-02-16 (×2): qty 1

## 2018-02-16 MED ORDER — FAMOTIDINE 20 MG PO TABS: 20.0000 mg | ORAL_TABLET | Freq: Every day | ORAL | Status: DC | PRN

## 2018-02-16 MED ORDER — LORAZEPAM 2 MG/ML IJ SOLN
0.5000 mg | Freq: Once | INTRAMUSCULAR | Status: AC
  Administered 2018-02-16: 0.5 mg via INTRAVENOUS
  Filled 2018-02-16: qty 1

## 2018-02-16 MED ORDER — ALBUTEROL (5 MG/ML) CONTINUOUS INHALATION SOLN
10.0000 mg/h | INHALATION_SOLUTION | Freq: Once | RESPIRATORY_TRACT | Status: AC
  Administered 2018-02-16: 10 mg/h via RESPIRATORY_TRACT

## 2018-02-16 MED ORDER — PREDNISONE 20 MG PO TABS
60.0000 mg | ORAL_TABLET | Freq: Every day | ORAL | Status: DC
  Administered 2018-02-17: 60 mg via ORAL
  Filled 2018-02-16: qty 3

## 2018-02-16 MED ORDER — IPRATROPIUM BROMIDE 0.02 % IN SOLN
0.5000 mg | RESPIRATORY_TRACT | Status: DC
  Administered 2018-02-16 – 2018-02-17 (×4): 0.5 mg via RESPIRATORY_TRACT
  Filled 2018-02-16 (×4): qty 2.5

## 2018-02-16 MED ORDER — LORATADINE 10 MG PO TABS
10.0000 mg | ORAL_TABLET | Freq: Every day | ORAL | Status: DC
  Administered 2018-02-17: 10 mg via ORAL
  Filled 2018-02-16: qty 1

## 2018-02-16 MED ORDER — LORAZEPAM 1 MG PO TABS: 1.0000 mg | ORAL_TABLET | Freq: Once | ORAL | Status: DC

## 2018-02-16 MED ORDER — KETAMINE HCL 50 MG/5ML IJ SOSY
0.1500 mg/kg | PREFILLED_SYRINGE | Freq: Once | INTRAMUSCULAR | Status: AC
  Administered 2018-02-16: 12 mg via INTRAVENOUS
  Filled 2018-02-16: qty 5

## 2018-02-16 MED ORDER — ALBUTEROL (5 MG/ML) CONTINUOUS INHALATION SOLN
INHALATION_SOLUTION | RESPIRATORY_TRACT | Status: AC
  Filled 2018-02-16: qty 20

## 2018-02-16 MED ORDER — POTASSIUM CHLORIDE CRYS ER 20 MEQ PO TBCR
20.0000 meq | EXTENDED_RELEASE_TABLET | Freq: Once | ORAL | Status: AC
  Administered 2018-02-16: 20 meq via ORAL
  Filled 2018-02-16: qty 1

## 2018-02-16 MED ORDER — VITAMIN D 1000 UNITS PO TABS
1000.0000 [IU] | ORAL_TABLET | Freq: Every day | ORAL | Status: DC
  Administered 2018-02-16 – 2018-02-17 (×2): 1000 [IU] via ORAL
  Filled 2018-02-16 (×2): qty 1

## 2018-02-16 MED ORDER — ALPRAZOLAM 0.5 MG PO TABS: 0.5000 mg | ORAL_TABLET | Freq: Three times a day (TID) | ORAL | Status: DC | PRN

## 2018-02-16 MED ORDER — ENOXAPARIN SODIUM 40 MG/0.4ML ~~LOC~~ SOLN
40.0000 mg | SUBCUTANEOUS | Status: DC
  Filled 2018-02-16: qty 0.4

## 2018-02-16 MED ORDER — NICOTINE 14 MG/24HR TD PT24
14.0000 mg | MEDICATED_PATCH | Freq: Every day | TRANSDERMAL | Status: DC
  Administered 2018-02-16 – 2018-02-17 (×2): 14 mg via TRANSDERMAL
  Filled 2018-02-16 (×2): qty 1

## 2018-02-16 MED ORDER — LEVALBUTEROL HCL 0.63 MG/3ML IN NEBU
0.6300 mg | INHALATION_SOLUTION | RESPIRATORY_TRACT | Status: DC
  Administered 2018-02-16 – 2018-02-17 (×4): 0.63 mg via RESPIRATORY_TRACT
  Filled 2018-02-16 (×4): qty 3

## 2018-02-16 MED ORDER — POTASSIUM CHLORIDE 10 MEQ/100ML IV SOLN
10.0000 meq | INTRAVENOUS | Status: DC
  Administered 2018-02-16: 10 meq via INTRAVENOUS
  Filled 2018-02-16 (×2): qty 100

## 2018-02-16 NOTE — Consult Note (Signed)
PULMONARY / CRITICAL CARE MEDICINE   NAME:  Aaron [name], MRN:  [id number], DOB:  01-28-1971, LOS: 0 ADMISSION DATE:  02/16/2018, CONSULTATION DATE:  02/16/18 REFERRING MD:  EDP, CHIEF COMPLAINT:  Asthma exacerbation  BRIEF HISTORY:    47 year old with history of vaping CBD, asthma, COPD.  Admitted with exacerbation  HISTORY OF PRESENT ILLNESS   Admitted with dyspnea, wheezing for the past few days. Given duo nebs, Solu-Medrol, magnesium with no improvement.  Subsequently he received continuous nebs, BiPAP and 1 dose of IV ketamine, IV Ativan. At time of my examination his resp status is improved, taken off BiPAP, no use of respiratory muscles  History noted for poor to control asthma.  He smokes a pack every 3 days Also vapes CBD oil which he obtains from the street. Maintained on albuterol, Flovent inhaler, singulair as outpatient but cannot obtain meds due to cost  History of intubation for asthma exacerbation in February 2019.  SIGNIFICANT PAST MEDICAL HISTORY   Asthma, COPD  SIGNIFICANT EVENTS:  10/18- Admit for asthma exacerbation  STUDIES:   Chest x-ray 02/16/2018- mild hyperinflation, clear lungs.  I reviewed the images personally  CULTURES:    ANTIBIOTICS:    LINES/TUBES:    CONSULTANTS:  PCCM 10/18 >  SUBJECTIVE:    CONSTITUTIONAL: BP (!) 152/85   Pulse (!) 112   Temp (!) 97 F (36.1 C) (Axillary)   Resp (!) 21   SpO2 100%   No intake/output data recorded.     FiO2 (%):  [50 %] 50 %  PHYSICAL EXAM: Gen:      No acute distress HEENT:  EOMI, sclera anicteric Neck:     No masses; no thyromegaly Lungs:    Mild exp wheeze CV:         Regular rate and rhythm; no murmurs Abd:      + bowel sounds; soft, non-tender; no palpable masses, no distension Ext:    No edema; adequate peripheral perfusion Skin:      Warm and dry; no rash Neuro: alert and oriented x 3 Psych: normal mood and affect  RESOLVED PROBLEM LIST    ASSESSMENT AND PLAN    47 year old with acute asthma exacerbation, severe asthma at baseline He has history of COPD but no PFTs on record Vapes cannabis but no lung infiltrates on CXR  Appears to have improved with treatment in the ED No need for ICU admission Okay for admission to stepdown Continue nebulizer, steroids with taper Discharge on Symbicort 160/4.5 or equivalent He will need follow-up in pulmonary clinic for PFTs and management  PCCM will be available as needed  SUMMARY OF TODAY'S PLAN:  Treat for asthma exacerbation BiPAP as needed, prednisone, nebulizer Will need follow-up in pulmonary clinic.  Best Practice / Goals of Care / Disposition.   DVT PROPHYLAXIS: NA SUP: NA NUTRITION:Diet MOBILITY:OOB, mobilize GOALS OF CARE:Full FAMILY DISCUSSIONS: Pt and girlfriend updated at bedside DISPOSITION SDU  LABS  Glucose No results for input(s): GLUCAP in the last 168 hours.  BMET Recent Labs  Lab 02/16/18 1530 02/16/18 1548  NA 141 141  K 3.0* 3.2*  CL 106 103  CO2 28  --   BUN 13 18  CREATININE 1.25* 1.20  GLUCOSE 119* 112*    Liver Enzymes Recent Labs  Lab 02/16/18 1530  AST 27  ALT 24  ALKPHOS 81  BILITOT 0.7  ALBUMIN 4.4    Electrolytes Recent Labs  Lab 02/16/18 1530  CALCIUM 8.8*    CBC Recent  Labs  Lab 02/16/18 1530 02/16/18 1548  WBC 7.3  --   HGB 14.3 15.0  HCT 44.3 44.0  PLT 211  --     ABG Recent Labs  Lab 02/16/18 1806  PHART 7.369  PCO2ART 43.2  PO2ART 121.0*    Coag's No results for input(s): APTT, INR in the last 168 hours.  Sepsis Markers No results for input(s): LATICACIDVEN, PROCALCITON, O2SATVEN in the last 168 hours.  Cardiac Enzymes No results for input(s): TROPONINI, PROBNP in the last 168 hours.  PAST MEDICAL HISTORY :   He  has a past medical history of Asthma and COPD (chronic obstructive pulmonary disease) (HCC).  PAST SURGICAL HISTORY:  He  has a past surgical history that includes Abdominal surgery and Hernia  repair.  Allergies  Allergen Reactions  . Keflex [Cephalexin] Diarrhea, Nausea And Vomiting and Rash    hallucinations    No current facility-administered medications on file prior to encounter.    Current Outpatient Medications on File Prior to Encounter  Medication Sig  . albuterol (PROVENTIL HFA;VENTOLIN HFA) 108 (90 Base) MCG/ACT inhaler Inhale 2 puffs into the lungs every 4 (four) hours as needed for wheezing or shortness of breath.  Marland Kitchen albuterol (PROVENTIL) (2.5 MG/3ML) 0.083% nebulizer solution Take 3 mLs (2.5 mg total) by nebulization 3 (three) times daily.  Marland Kitchen albuterol (PROVENTIL) (5 MG/ML) 0.5% nebulizer solution Take 0.5 mLs (2.5 mg total) by nebulization every 2 (two) hours as needed for wheezing or shortness of breath.  . ALPRAZolam (XANAX) 0.5 MG tablet Take 1 tablet (0.5 mg total) by mouth every 8 (eight) hours as needed for anxiety or sleep.  . cetirizine (ZYRTEC) 10 MG tablet Take 1 tablet (10 mg total) by mouth daily.  . cholecalciferol (VITAMIN D) 1000 units tablet Take 1,000 Units by mouth daily.  . fluticasone (FLOVENT HFA) 110 MCG/ACT inhaler Inhale 2 puffs into the lungs 2 (two) times daily.  Marland Kitchen dicyclomine (BENTYL) 10 MG capsule Take 1 capsule (10 mg total) by mouth 4 (four) times daily -  before meals and at bedtime. (Patient not taking: Reported on 02/13/2018)  . montelukast (SINGULAIR) 10 MG tablet Take 1 tablet (10 mg total) by mouth at bedtime. (Patient not taking: Reported on 02/13/2018)  . omeprazole (PRILOSEC) 20 MG capsule Take 1 capsule (20 mg total) by mouth daily.  . ranitidine (ZANTAC) 150 MG tablet Take 150 mg by mouth every evening.    FAMILY HISTORY:   His family history includes Diabetes in his mother; Hypertension in his father.  SOCIAL HISTORY:  He  reports that he has been smoking cigarettes. He has been smoking about 0.50 packs per day. He has never used smokeless tobacco. He reports that he drinks alcohol. He reports that he has current or past  drug history. Drug: Marijuana.  REVIEW OF SYSTEMS:     All negative; except for those that are bolded, which indicate positives.  Constitutional: weight loss, weight gain, night sweats, fevers, chills, fatigue, weakness.  HEENT: headaches, sore throat, sneezing, nasal congestion, post nasal drip, difficulty swallowing, tooth/dental problems, visual complaints, visual changes, ear aches. Neuro: difficulty with speech, weakness, numbness, ataxia. CV:  chest pain, orthopnea, PND, swelling in lower extremities, dizziness, palpitations, syncope.  Resp: cough, hemoptysis, dyspnea, wheezing. GI: heartburn, indigestion, abdominal pain, nausea, vomiting, diarrhea, constipation, change in bowel habits, loss of appetite, hematemesis, melena, hematochezia.  GU: dysuria, change in color of urine, urgency or frequency, flank pain, hematuria. MSK: joint pain or swelling, decreased range of  motion. Psych: change in mood or affect, depression, anxiety, suicidal ideations, homicidal ideations. Skin: rash, itching, bruising.  Chilton Greathouse MD [facility] 02/16/2018, 6:59 PM\

## 2018-02-16 NOTE — ED Notes (Signed)
Pt removed from bipap per admitting MD, placed on 2L [place]. Decreased potassium IV to 17mL/hour per MD.

## 2018-02-16 NOTE — ED Triage Notes (Signed)
Pt BIB [facility], sudden onset shortness of breath started to today. Pt had albuterol treatment at home without relief, given duoneb 125mg  solumedrol and 2g mag given PTA.

## 2018-02-16 NOTE — ED Provider Notes (Signed)
[facility] EMERGENCY DEPARTMENT Provider Note   CSN: [id number] Arrival date & time: 02/16/18  1501     History   Chief Complaint Chief Complaint  Patient presents with  . Shortness of Breath    HPI Aaron [name] is a 47 y.o. male.  HPI   Patient is a 47yo male with PMHx of poorly controlled mild persistent asthma, GERD, and anxiety who presents with constant worsening SOB and productive cough x  3 days.  SOB became worse today and he had no improvement with home albuterol treatment.  He c/o associated rhinorrhea, congestion, post tussive emesis, and chest tightness however denies fevers and abdominal pain.  No sick contacts.  On EMS arrival he was tachypneic and tripoding and subsequently given duoneb, 125mg  IV solumedrol, and 2g Mg without improvement. He was recently seen by PCP on 02/13/18 for asthma management.  Unable to afford Singulair.  Patient is an everyday smoker.  Per chart review patient seen in May 2019 for asthma exacerbation triggered by family deaths.  Patient was also seen in February 2019 for asthma exacerbation that required intubation after poor tolerance of BiPAP.    Past Medical History:  Diagnosis Date  . Asthma   . COPD (chronic obstructive pulmonary disease) [facility])     Patient Active Problem List   Diagnosis Date Noted  . Acute hypoxemic respiratory failure (HCC) 02/16/2018  . Severe asthma with acute exacerbation 02/16/2018  . Tobacco use 02/16/2018  . GERD (gastroesophageal reflux disease) 02/16/2018  . COPD with acute exacerbation (HCC) 06/11/2017  . Acute respiratory failure with hypoxia and hypercapnia (HCC) 06/11/2017  . Tobacco abuse (since age 26- > 55 Pack Years) 06/11/2017  . Anxiety 06/11/2017  . Asthma exacerbation 06/06/2017  . Hypokalemia 02/23/2017  . Acute on chronic respiratory failure with hypoxia (HCC) 02/23/2017    Past Surgical History:  Procedure Laterality Date  . ABDOMINAL SURGERY    . HERNIA  REPAIR          Home Medications    Prior to Admission medications   Medication Sig Start Date End Date Taking? Authorizing Provider  albuterol (PROVENTIL HFA;VENTOLIN HFA) 108 (90 Base) MCG/ACT inhaler Inhale 2 puffs into the lungs every 4 (four) hours as needed for wheezing or shortness of breath. 01/26/18  Yes Kallie Locks, [name]  albuterol (PROVENTIL) (2.5 MG/3ML) 0.083% nebulizer solution Take 3 mLs (2.5 mg total) by nebulization 3 (three) times daily. 10/17/17  Yes Kallie Locks, [name]  albuterol (PROVENTIL) (5 MG/ML) 0.5% nebulizer solution Take 0.5 mLs (2.5 mg total) by nebulization every 2 (two) hours as needed for wheezing or shortness of breath. 06/11/17  Yes Emokpae, Courage, MD  ALPRAZolam Prudy Feeler) 0.5 MG tablet Take 1 tablet (0.5 mg total) by mouth every 8 (eight) hours as needed for anxiety or sleep. 12/08/17 12/08/18 Yes Kallie Locks, [name]  cetirizine (ZYRTEC) 10 MG tablet Take 1 tablet (10 mg total) by mouth daily. 09/20/17  Yes Kallie Locks, [name]  cholecalciferol (VITAMIN D) 1000 units tablet Take 1,000 Units by mouth daily.   Yes [provider]  fluticasone (FLOVENT HFA) 110 MCG/ACT inhaler Inhale 2 puffs into the lungs 2 (two) times daily. 02/13/18  Yes Kallie Locks, [name]  dicyclomine (BENTYL) 10 MG capsule Take 1 capsule (10 mg total) by mouth 4 (four) times daily -  before meals and at bedtime. Patient not taking: Reported on 02/13/2018 06/23/17 07/23/17  Bing Neighbors, [name]  montelukast (SINGULAIR) 10 MG  tablet Take 1 tablet (10 mg total) by mouth at bedtime. Patient not taking: Reported on 02/13/2018 12/22/17 06/23/18  Kallie Locks, [name]  omeprazole (PRILOSEC) 20 MG capsule Take 1 capsule (20 mg total) by mouth daily. 02/13/18   Kallie Locks, [name]  ranitidine (ZANTAC) 150 MG tablet Take 150 mg by mouth every evening. 02/15/18   [provider]    Family History Family History  Problem Relation Age of Onset  . Diabetes Mother   .  Hypertension Father     Social History Social History   Tobacco Use  . Smoking status: Current Some Day Smoker    Packs/day: 0.50    Types: Cigarettes  . Smokeless tobacco: Never Used  Substance Use Topics  . Alcohol use: Yes    Comment: occasion  . Drug use: Yes    Types: Marijuana     Allergies   Keflex [cephalexin]   Review of Systems Review of Systems  Constitutional: Negative for chills and fever.  HENT: Positive for congestion and rhinorrhea.   Eyes: Negative for pain and visual disturbance.  Respiratory: Positive for cough, chest tightness, shortness of breath and wheezing.   Cardiovascular: Negative for chest pain and palpitations.  Gastrointestinal: Positive for vomiting (post tussive). Negative for abdominal pain, diarrhea and nausea.  Genitourinary: Negative for dysuria and hematuria.  Musculoskeletal: Negative for arthralgias and back pain.  Skin: Negative for color change and rash.  Neurological: Negative for seizures and syncope.  All other systems reviewed and are negative.    Physical Exam Updated Vital Signs BP 115/63 (BP Location: Right Arm)   Pulse (!) 101   Temp 98 F (36.7 C) (Oral)   Resp 18   SpO2 98%   Physical Exam  Constitutional: He appears well-developed and well-nourished. He appears distressed.  HENT:  Head: Normocephalic and atraumatic.  Mouth/Throat: Oropharynx is clear and moist.  Eyes: Pupils are equal, round, and reactive to light. Conjunctivae and EOM are normal.  Neck: Normal range of motion. Neck supple.  Cardiovascular: Regular rhythm and intact distal pulses. Tachycardia present.  Pulmonary/Chest: Accessory muscle usage present. Tachypnea noted. He is in respiratory distress. He has wheezes (diffuse expiratory).  Tripoding on albuterol neb treatment.  Abdominal: Soft. He exhibits no distension. There is no tenderness. There is no guarding.  Musculoskeletal: He exhibits no edema.  Neurological: He is alert.  Skin:  Skin is warm and dry. Capillary refill takes less than 2 seconds.  Psychiatric: He has a normal mood and affect.  Nursing note and vitals reviewed.    ED Treatments / Results  Labs (all labs ordered are listed, but only abnormal results are displayed) Labs Reviewed  COMPREHENSIVE METABOLIC PANEL - Abnormal; Notable for the following components:      Result Value   Potassium 3.0 (*)    Glucose, Bld 119 (*)    Creatinine, Ser 1.25 (*)    Calcium 8.8 (*)    All other components within normal limits  I-STAT CHEM 8, ED - Abnormal; Notable for the following components:   Potassium 3.2 (*)    Glucose, Bld 112 (*)    Calcium, Ion 1.12 (*)    All other components within normal limits  I-STAT ARTERIAL BLOOD GAS, ED - Abnormal; Notable for the following components:   pO2, Arterial 121.0 (*)    All other components within normal limits  CBC  BASIC METABOLIC PANEL    EKG EKG Interpretation  Date/Time:  Friday February 16 2018 15:29:13 EDT  Ventricular Rate:  113 PR Interval:    QRS Duration: 92 QT Interval:  328 QTC Calculation: 450 R Axis:   90 Text Interpretation:  Sinus tachycardia Borderline right axis deviation Anteroseptal infarct, old No STEMI  Confirmed by Alona Bene [phone]) on 02/16/2018 3:35:47 PM Also confirmed by Alona Bene [phone]), editor Barbette Hair [phone])  on 02/16/2018 4:37:16 PM   Radiology Dg Chest Portable 1 View  Result Date: 02/16/2018 CLINICAL DATA:  Shortness of breath and cough. History of asthma and COPD. Current smoker. EXAM: PORTABLE CHEST 1 VIEW COMPARISON:  08/31/2017 FINDINGS: The heart size and mediastinal contours are within normal limits. There is no evidence of pulmonary edema, consolidation, pneumothorax, nodule or pleural fluid. The visualized skeletal structures are unremarkable. IMPRESSION: No active disease. Electronically Signed   By: Irish Lack M.D.   On: 02/16/2018 15:39    Procedures Procedures (including critical care  time)  Medications Ordered in ED Medications  albuterol (PROVENTIL, VENTOLIN) (5 MG/ML) 0.5% continuous inhalation solution (has no administration in time range)  nicotine (NICODERM CQ - dosed in mg/24 hours) patch 14 mg (14 mg Transdermal Patch Applied 02/16/18 2221)  ALPRAZolam (XANAX) tablet 0.5 mg (has no administration in time range)  pantoprazole (PROTONIX) EC tablet 40 mg (40 mg Oral Given 02/16/18 2146)  famotidine (PEPCID) tablet 20 mg (has no administration in time range)  cholecalciferol (VITAMIN D) tablet 1,000 Units (1,000 Units Oral Given 02/16/18 2146)  loratadine (CLARITIN) tablet 10 mg (has no administration in time range)  ipratropium (ATROVENT) nebulizer solution 0.5 mg (0.5 mg Nebulization Given 02/16/18 2337)  predniSONE (DELTASONE) tablet 60 mg (has no administration in time range)  enoxaparin (LOVENOX) injection 40 mg (40 mg Subcutaneous Not Given 02/16/18 2148)  levalbuterol (XOPENEX) nebulizer solution 0.63 mg (0.63 mg Nebulization Given 02/16/18 2337)  albuterol (PROVENTIL,VENTOLIN) solution continuous neb (10 mg/hr Nebulization Given 02/16/18 1515)  LORazepam (ATIVAN) injection 0.5 mg (0.5 mg Intravenous Given 02/16/18 1523)  ketamine 50 mg in normal saline 5 mL (10 mg/mL) syringe (12 mg Intravenous Given 02/16/18 1639)  albuterol (PROVENTIL,VENTOLIN) solution continuous neb (10 mg/hr Nebulization Given 02/16/18 1652)  potassium chloride SA (K-DUR,KLOR-CON) CR tablet 20 mEq (20 mEq Oral Given 02/16/18 2221)     Initial Impression / Assessment and Plan / ED Course  I have reviewed the triage vital signs and the nursing notes.  Pertinent labs & imaging results that were available during my care of the patient were reviewed by me and considered in my medical decision making (see chart for details).      Patient is a 47yo male with PMHx of poorly controlled mild persistent asthma, GERD, and anxiety who presents with acute asthma exacerbation and URI symptoms over  the last 3 days.  Required intubation in February for the same.  On arrival he is in respiratory distress, tripoding, tachypneic, and tachycardic.  Normotensive and satting 98% on 3L [place].  Exam as above significant for diffuse wheezes and accessory muscle use.  He was immediately placed on continuous albuterol treatment and given ativan for anxiety (history of the same).  He has thus far received solumedrol, duoneb, and magnesium via EMS.  If no improvement will attempt BiPAP and ketamine.    Portable CXR without consolidation to suggest PNA or PTX.  EKG shows sinus tachycardia and no evidence of acute ischemic changes.  Baseline labs with hypokalemia of 3.0 otherwise unremarkable.  Cr 1.20 which is at baseline.  No leukocytosis.  Potassium repleted.  1630 - Patient placed  on BiPAP and given ketamine as no improvement.  1730 - Patient with significant improvement.  Will admit for further observation and management.  Case discussed with hospitalist will admit however is requesting critical care evaluation.  Critical care does not advise ICU admission, patient okay for stepdown.  HDS at transfer.   The plan for this patient was discussed with Dr. Jacqulyn Bath who voiced agreement and who oversaw evaluation and treatment of this patient.   Final Clinical Impressions(s) / ED Diagnoses   Final diagnoses:  Moderate persistent asthma with acute exacerbation    ED Discharge Orders    None       Abelardo Diesel, MD 02/17/18 9629    Maia Plan, MD 02/17/18 1000

## 2018-02-16 NOTE — Progress Notes (Signed)
Patient refused NIV as needed at this time, no distress noted.

## 2018-02-16 NOTE — H&P (Signed)
History and Physical    Aaron [name]:[id number] DOB: 1970-09-08 DOA: 02/16/2018  PCP: Kallie Locks, [name] Patient coming from: Home  Chief Complaint: Shortness of breath  HPI: Aaron [name] is a 47 y.o. male with medical history significant of asthma, COPD presenting to the hospital for further evaluation of sudden onset shortness of breath.  Patient states his uncle was cutting grass earlier today and he was standing there and suddenly felt short of breath, started wheezing and coughing.  States his asthma has been worse lately as he has been requiring more than the usual dose of his inhalers, and as such, ran out of them 2 days ago.  He has also been coughing and sputum is clear white in color.  Also mentions he was confused about how to use his home inhalers.  States he has been smoking half pack per day since the age of 57.  He also vapes CBD oil.   Per chart review, patient was admitted to [facility] hospital in February 2019 for acute hypoxic and hypercapnic respiratory failure secondary to asthma which required intubation after poor tolerance of BiPAP.Marland Kitchen  ED Course: On arrival to the ED, afebrile, tachycardic, tachypneic, and SPO2 100% on nonrebreather.  Patient had an albuterol treatment at home without relief.  He was given DuoNeb, 125 mg Solu-Medrol, and 2 mg magnesium by EMS.  Patient did not improve and noted to be tripoding.  He received continuous albuterol nebulizer treatment with no improvement.  Subsequently placed on BiPAP and received ketamine 50 mg, IV Ativan 0.5 mg, and IV potassium 10 mEq x 4 in the ED.  Labs showing no leukocytosis.  Potassium 3.0.  Creatinine 1.2 (at baseline).  ABG on BiPAP showing pH 7.36, PCO2 43, and PO2 121.  ED provider informed me that the patient improved after receiving BiPAP and ketamine.  Chest x-ray showing no active disease.  TRH patient to admit.  ED provider will be discussing the case with critical care.    Review of Systems: As  per HPI otherwise 10 point review of systems negative.  Past Medical History:  Diagnosis Date  . Asthma   . COPD (chronic obstructive pulmonary disease) (HCC)     Past Surgical History:  Procedure Laterality Date  . ABDOMINAL SURGERY    . HERNIA REPAIR       reports that he has been smoking cigarettes. He has been smoking about 0.50 packs per day. He has never used smokeless tobacco. He reports that he drinks alcohol. He reports that he has current or past drug history. Drug: Marijuana.  Allergies  Allergen Reactions  . Keflex [Cephalexin] Diarrhea, Nausea And Vomiting and Rash    hallucinations    Family History  Problem Relation Age of Onset  . Diabetes Mother   . Hypertension Father     Prior to Admission medications   Medication Sig Start Date End Date Taking? Authorizing Provider  albuterol (PROVENTIL HFA;VENTOLIN HFA) 108 (90 Base) MCG/ACT inhaler Inhale 2 puffs into the lungs every 4 (four) hours as needed for wheezing or shortness of breath. 01/26/18  Yes Kallie Locks, [name]  albuterol (PROVENTIL) (2.5 MG/3ML) 0.083% nebulizer solution Take 3 mLs (2.5 mg total) by nebulization 3 (three) times daily. 10/17/17  Yes Kallie Locks, [name]  albuterol (PROVENTIL) (5 MG/ML) 0.5% nebulizer solution Take 0.5 mLs (2.5 mg total) by nebulization every 2 (two) hours as needed for wheezing or shortness of breath. 06/11/17  Yes Shon Hale, MD  ALPRAZolam (XANAX) 0.5 MG tablet Take 1 tablet (0.5 mg total) by mouth every 8 (eight) hours as needed for anxiety or sleep. 12/08/17 12/08/18 Yes Kallie Locks, [name]  cetirizine (ZYRTEC) 10 MG tablet Take 1 tablet (10 mg total) by mouth daily. 09/20/17  Yes Kallie Locks, [name]  cholecalciferol (VITAMIN D) 1000 units tablet Take 1,000 Units by mouth daily.   Yes [provider]  fluticasone (FLOVENT HFA) 110 MCG/ACT inhaler Inhale 2 puffs into the lungs 2 (two) times daily. 02/13/18  Yes Kallie Locks, [name]  dicyclomine  (BENTYL) 10 MG capsule Take 1 capsule (10 mg total) by mouth 4 (four) times daily -  before meals and at bedtime. Patient not taking: Reported on 02/13/2018 06/23/17 07/23/17  Bing Neighbors, [name]  montelukast (SINGULAIR) 10 MG tablet Take 1 tablet (10 mg total) by mouth at bedtime. Patient not taking: Reported on 02/13/2018 12/22/17 06/23/18  Kallie Locks, [name]  omeprazole (PRILOSEC) 20 MG capsule Take 1 capsule (20 mg total) by mouth daily. 02/13/18   Kallie Locks, [name]  ranitidine (ZANTAC) 150 MG tablet Take 150 mg by mouth every evening. 02/15/18   [provider]    Physical Exam: Vitals:   02/16/18 1652 02/16/18 1730 02/16/18 1800 02/16/18 1845  BP:  131/72 (!) 152/85 (!) 152/81  Pulse:  (!) 108 (!) 112 (!) 115  Resp:  (!) 21 (!) 21 19  Temp:      TempSrc:      SpO2: 99% 100% 100% 96%    Physical Exam  Constitutional: He is oriented to person, place, and time. He appears well-developed and well-nourished. No distress.  Resting comfortably in a hospital stretcher.  HENT:  Head: Normocephalic and atraumatic.  Mouth/Throat: Oropharynx is clear and moist.  Eyes: Right eye exhibits no discharge. Left eye exhibits no discharge.  Neck: Neck supple. No tracheal deviation present.  Cardiovascular: Regular rhythm and intact distal pulses. Exam reveals no gallop and no friction rub.  No murmur heard. Tachycardic  Pulmonary/Chest: Effort normal and breath sounds normal. He has no wheezes. He has no rales.  Able to speak clearly in full sentences On 2 L supplemental oxygen via nasal cannula  Abdominal: Soft. Bowel sounds are normal. He exhibits no distension. There is no tenderness.  Musculoskeletal: He exhibits no edema.  Neurological: He is alert and oriented to person, place, and time.  Skin: Skin is warm and dry.     Labs on Admission: I have personally reviewed following labs and imaging studies  CBC: Recent Labs  Lab 02/16/18 1530 02/16/18 1548  WBC 7.3   --   HGB 14.3 15.0  HCT 44.3 44.0  MCV 92.9  --   PLT 211  --    Basic Metabolic Panel: Recent Labs  Lab 02/16/18 1530 02/16/18 1548  NA 141 141  K 3.0* 3.2*  CL 106 103  CO2 28  --   GLUCOSE 119* 112*  BUN 13 18  CREATININE 1.25* 1.20  CALCIUM 8.8*  --    GFR: Estimated Creatinine Clearance: 79.4 mL/min (by C-G formula based on SCr of 1.2 mg/dL). Liver Function Tests: Recent Labs  Lab 02/16/18 1530  AST 27  ALT 24  ALKPHOS 81  BILITOT 0.7  PROT 7.3  ALBUMIN 4.4   No results for input(s): LIPASE, AMYLASE in the last 168 hours. No results for input(s): AMMONIA in the last 168 hours. Coagulation Profile: No results for input(s): INR, PROTIME in the last 168  hours. Cardiac Enzymes: No results for input(s): CKTOTAL, CKMB, CKMBINDEX, TROPONINI in the last 168 hours. BNP (last 3 results) No results for input(s): PROBNP in the last 8760 hours. HbA1C: No results for input(s): HGBA1C in the last 72 hours. CBG: No results for input(s): GLUCAP in the last 168 hours. Lipid Profile: No results for input(s): CHOL, HDL, LDLCALC, TRIG, CHOLHDL, LDLDIRECT in the last 72 hours. Thyroid Function Tests: No results for input(s): TSH, T4TOTAL, FREET4, T3FREE, THYROIDAB in the last 72 hours. Anemia Panel: No results for input(s): VITAMINB12, FOLATE, FERRITIN, TIBC, IRON, RETICCTPCT in the last 72 hours. Urine analysis:    Component Value Date/Time   COLORURINE YELLOW 08/31/2017 1016   APPEARANCEUR CLEAR 08/31/2017 1016   LABSPEC 1.020 08/31/2017 1016   PHURINE 7.0 08/31/2017 1016   GLUCOSEU NEGATIVE 08/31/2017 1016   HGBUR NEGATIVE 08/31/2017 1016   BILIRUBINUR negative 09/20/2017 0915   KETONESUR negative 09/20/2017 0915   KETONESUR NEGATIVE 08/31/2017 1016   PROTEINUR negative 09/20/2017 0915   PROTEINUR NEGATIVE 08/31/2017 1016   UROBILINOGEN 0.2 09/20/2017 0915   NITRITE Negative 09/20/2017 0915   NITRITE NEGATIVE 08/31/2017 1016   LEUKOCYTESUR Negative 09/20/2017  0915    Radiological Exams on Admission: Dg Chest Portable 1 View  Result Date: 02/16/2018 CLINICAL DATA:  Shortness of breath and cough. History of asthma and COPD. Current smoker. EXAM: PORTABLE CHEST 1 VIEW COMPARISON:  08/31/2017 FINDINGS: The heart size and mediastinal contours are within normal limits. There is no evidence of pulmonary edema, consolidation, pneumothorax, nodule or pleural fluid. The visualized skeletal structures are unremarkable. IMPRESSION: No active disease. Electronically Signed   By: Irish Lack M.D.   On: 02/16/2018 15:39    EKG: Independently reviewed.  Sinus tachycardia and nonspecific T wave changes.  Assessment/Plan Principal Problem:   Acute hypoxemic respiratory failure (HCC) Active Problems:   Hypokalemia   Anxiety   Severe asthma with acute exacerbation   Tobacco use   GERD (gastroesophageal reflux disease)   Acute hypoxemic respiratory failure secondary to acute exacerbation of severe baseline asthma -COPD also listed in his history but no prior PFTs in the chart. -Vapes CBD but chest x-ray without infiltrates. -Currently improved with nebulizer treatments, ketamine, and BiPAP in the ED -Patient was seen by critical care and ICU admission not recommended at this time -Continue Xopenex-ipratropium every 4 hours -Patient already received IV Solu-Medrol 125 mg today.  Start prednisone 60 mg oral tomorrow with taper. -Claritin for allergies -Discharge on Symbicort 160/4.5 or equivalent per critical care recommendations -Supplemental oxygen, BiPAP as needed -He will need pulmonology follow-up for PFTs and continued management of his severe asthma  Hypokalemia Potassium 3.2.  Received IV potassium 10 mEq x 4 in the ED.  Received magnesium treatment by EMS. -BMP in a.m.  Tobacco use -Discussed smoking cessation -NicoDerm patch  Anxiety -Continue home Xanax prn  GERD -Continue PPI, H2 blocker  DVT prophylaxis: Lovenox Code Status:  Full code; discussed with patient Family Communication: Fianc at bedside updated. Disposition Plan: Anticipate discharge to home in 1 to 2 days. Consults called: PCCM (Dr. Isaiah Serge) Admission status: Observation   John Giovanni MD [facility] Pager [phone]  If 7PM-7AM, please contact night-coverage [email or web address] Password [email or web address]  02/16/2018, 7:40 PM

## 2018-02-17 LAB — BASIC METABOLIC PANEL
ANION GAP: 9 (ref 5–15)
BUN: 18 mg/dL (ref 6–20)
CALCIUM: 9.3 mg/dL (ref 8.9–10.3)
CHLORIDE: 107 mmol/L (ref 98–111)
CO2: 23 mmol/L (ref 22–32)
Creatinine, Ser: 1.2 mg/dL (ref 0.61–1.24)
GFR calc non Af Amer: >60 mL/min (ref >60)
Glucose, Bld: 167 mg/dL — ABNORMAL HIGH (ref 70–99)
POTASSIUM: 3.9 mmol/L (ref 3.5–5.1)
Sodium: 139 mmol/L (ref 135–145)

## 2018-02-17 MED ORDER — NICOTINE 14 MG/24HR TD PT24: 14.0000 mg | MEDICATED_PATCH | Freq: Every day | TRANSDERMAL | 0 refills | Status: DC

## 2018-02-17 MED ORDER — PREDNISONE 20 MG PO TABS: 20.0000 mg | ORAL_TABLET | Freq: Every day | ORAL | 0 refills | Status: DC

## 2018-02-17 NOTE — Progress Notes (Signed)
Pt given dc rx and dc instructions. Dc home in stable condition with family.

## 2018-02-17 NOTE — Clinical Social Work Note (Signed)
CSW acknowledges consult that patient has difficulty paying for medications. Please consult RNCM.  CSW signing off. Consult again if any social work needs arise.  Charlynn Court, CSW [phone]

## 2018-02-19 ENCOUNTER — Telehealth: Payer: Self-pay

## 2018-02-19 ENCOUNTER — Other Ambulatory Visit: Payer: Self-pay | Admitting: Family Medicine

## 2018-02-19 DIAGNOSIS — F419 Anxiety disorder, unspecified: Secondary | ICD-10-CM

## 2018-02-19 NOTE — Telephone Encounter (Signed)
Pt has been scheduled for HFU on 02/28/18 with Dr. Isaiah Serge.  Nothing further is needed.

## 2018-02-19 NOTE — Telephone Encounter (Signed)
-----   Message from Chilton Greathouse, MD sent at 02/17/2018  6:09 AM EDT ----- Regarding: Hospital follow up Hi Margie,  Can you make follow up in clinic with me after hospital discharge for asthma exacerbation.  Praveen

## 2018-02-23 ENCOUNTER — Telehealth: Payer: Self-pay

## 2018-02-26 NOTE — Discharge Summary (Signed)
Physician Discharge Summary  Aaron [name] ZOX:[id number] DOB: 08/09/1970 DOA: 02/16/2018  PCP: Kallie Locks, [name]  Admit date: 02/16/2018 Discharge date: 02/17/2018  Time spent: 35 minutes  Recommendations for Outpatient Follow-up:  1. PCP in 1 week, please started ICS/LABA at FU 2. Pulm Dr.Mannam in 2 weeks   Discharge Diagnoses:  Principal Problem:   Acute hypoxemic respiratory failure (HCC) Active Problems:   Hypokalemia   Anxiety   Severe asthma with acute exacerbation   Tobacco use   GERD (gastroesophageal reflux disease)   Discharge Condition: stable  Diet recommendation: heart healthy  There were no vitals filed for this visit.  History of present illness:  Aaron [name] is a 47 y.o. male with medical history significant of asthma, COPD presented to the hospital for further evaluation of sudden onset shortness of breath, wheezing and coughing.  States his asthma has been worse lately as he has been requiring more than the usual dose of his inhalers.  Stated he has been smoking half pack per day since the age of 67.  He also vapes CBD oil.  Hospital Course:   Acute asthma exacerbation -history suggestive of severe asthma/COPD  at baseline -smoking since 19years, counseled -treated in SDU with IV steroids, Abx and nebs -H/o vaping CBD oil but improved quickly with supportive care and did not have lung infiltrates on CXR -discharged home in a stable condition with Pulm FU with Dr.Mannam -counseled regarding smoking cessation and vaping cessation -discharged home on prednisone taper, singulair, albuterol PRN, will need ICS/Laba started at FU   Consultations:  Pulmonary  Discharge Exam: Vitals:   02/17/18 0800 02/17/18 0843  BP:  118/73  Pulse: 99 94  Resp: 18 17  Temp:  98 F (36.7 C)  SpO2: 97% 94%    General: AAOx3 Cardiovascular: S1S2/RRR Respiratory: no wheezing  Discharge Instructions   Discharge Instructions    Diet - low sodium  heart healthy   Complete by:  As directed    Increase activity slowly   Complete by:  As directed      Allergies as of 02/17/2018      Reactions   Keflex [cephalexin] Diarrhea, Nausea And Vomiting, Rash   hallucinations      Medication List    STOP taking these medications   dicyclomine 10 MG capsule Commonly known as:  BENTYL   ranitidine 150 MG tablet Commonly known as:  ZANTAC     TAKE these medications   albuterol (5 MG/ML) 0.5% nebulizer solution Commonly known as:  PROVENTIL Take 0.5 mLs (2.5 mg total) by nebulization every 2 (two) hours as needed for wheezing or shortness of breath.   albuterol (2.5 MG/3ML) 0.083% nebulizer solution Commonly known as:  PROVENTIL Take 3 mLs (2.5 mg total) by nebulization 3 (three) times daily.   albuterol 108 (90 Base) MCG/ACT inhaler Commonly known as:  PROVENTIL HFA;VENTOLIN HFA Inhale 2 puffs into the lungs every 4 (four) hours as needed for wheezing or shortness of breath.   ALPRAZolam 0.5 MG tablet Commonly known as:  XANAX Take 1 tablet (0.5 mg total) by mouth every 8 (eight) hours as needed for anxiety or sleep.   cetirizine 10 MG tablet Commonly known as:  ZYRTEC Take 1 tablet (10 mg total) by mouth daily.   cholecalciferol 1000 units tablet Commonly known as:  VITAMIN D Take 1,000 Units by mouth daily.   fluticasone 110 MCG/ACT inhaler Commonly known as:  FLOVENT HFA Inhale 2 puffs into the lungs 2 (  two) times daily.   montelukast 10 MG tablet Commonly known as:  SINGULAIR Take 1 tablet (10 mg total) by mouth at bedtime.   nicotine 14 mg/24hr patch Commonly known as:  NICODERM CQ - dosed in mg/24 hours Place 1 patch (14 mg total) onto the skin daily.   omeprazole 20 MG capsule Commonly known as:  PRILOSEC Take 1 capsule (20 mg total) by mouth daily.   predniSONE 20 MG tablet Commonly known as:  DELTASONE Take 1-2 tablets (20-40 mg total) by mouth daily with breakfast. TAke 40mg  daily for 2days then 20mg   daily for 2days      Allergies  Allergen Reactions  . Keflex [Cephalexin] Diarrhea, Nausea And Vomiting and Rash    hallucinations   Follow-up Information    Kallie Locks, [name]. Schedule an appointment as soon as possible for a visit in 1 week(s).   Specialty:  Family Medicine Contact information: [address] [place] [address] [phone]            The results of significant diagnostics from this hospitalization (including imaging, microbiology, ancillary and laboratory) are listed below for reference.    Significant Diagnostic Studies: Dg Chest Portable 1 View  Result Date: 02/16/2018 CLINICAL DATA:  Shortness of breath and cough. History of asthma and COPD. Current smoker. EXAM: PORTABLE CHEST 1 VIEW COMPARISON:  08/31/2017 FINDINGS: The heart size and mediastinal contours are within normal limits. There is no evidence of pulmonary edema, consolidation, pneumothorax, nodule or pleural fluid. The visualized skeletal structures are unremarkable. IMPRESSION: No active disease. Electronically Signed   By: Irish Lack M.D.   On: 02/16/2018 15:39    Microbiology: No results found for this or any previous visit (from the past 240 hour(s)).   Labs: Basic Metabolic Panel: No results for input(s): NA, K, CL, CO2, GLUCOSE, BUN, CREATININE, CALCIUM, MG, PHOS in the last 168 hours. Liver Function Tests: No results for input(s): AST, ALT, ALKPHOS, BILITOT, PROT, ALBUMIN in the last 168 hours. No results for input(s): LIPASE, AMYLASE in the last 168 hours. No results for input(s): AMMONIA in the last 168 hours. CBC: No results for input(s): WBC, NEUTROABS, HGB, HCT, MCV, PLT in the last 168 hours. Cardiac Enzymes: No results for input(s): CKTOTAL, CKMB, CKMBINDEX, TROPONINI in the last 168 hours. BNP: BNP (last 3 results) Recent Labs    06/06/17 0644  BNP 17.6    ProBNP (last 3 results) No results for input(s): PROBNP in the last 8760 hours.  CBG: No  results for input(s): GLUCAP in the last 168 hours.     Signed:  Zannie Cove MD.  [facility] 02/26/2018, 9:45 AM

## 2018-02-28 ENCOUNTER — Ambulatory Visit (INDEPENDENT_AMBULATORY_CARE_PROVIDER_SITE_OTHER): Payer: Self-pay | Admitting: Pulmonary Disease

## 2018-02-28 ENCOUNTER — Encounter: Payer: Self-pay | Admitting: Pulmonary Disease

## 2018-02-28 VITALS — BP 118/78 | HR 88 | Resp 18 | Ht 70.0 in | Wt 180.2 lb

## 2018-02-28 DIAGNOSIS — R0602 Shortness of breath: Secondary | ICD-10-CM

## 2018-02-28 LAB — POCT EXHALED NITRIC OXIDE: FENO LEVEL (PPB): 23

## 2018-02-28 MED ORDER — FLUTICASONE-UMECLIDIN-VILANT 100-62.5-25 MCG/INH IN AEPB: 1.0000 | INHALATION_SPRAY | Freq: Every day | RESPIRATORY_TRACT | 5 refills | Status: DC

## 2018-02-28 NOTE — Patient Instructions (Signed)
Continue the prednisone taper We will give you samples and patient assistance for inhaler called trelegy.  Use this instead of Flovent Do not go back to vaping alcohol and continue to work on smoking cessation Please work on Advertising account planner I will see her back in clinic in 1 to 2 months.

## 2018-02-28 NOTE — Progress Notes (Signed)
Aaron [name]    [id number]    [birth year]  Primary Care Physician:Stroud, Rolm Gala, [name]  Referring Physician: Kallie Locks, [name] [address] [place], [place] [address]  Chief complaint:   Follow-up for respiratory failure.  HPI: 47 year old with history of vaping THC, asthma, COPD.    History of intubation for asthma/COPD exacerbation, ARDS in February 2019. Admitted with repeat exacerbation on 02/16/2018 with acute exacerbation, dyspnea, wheezing.  This time he turned around quickly with prednisone, nebulizers and was discharged in 1 day. Maintained on albuterol, Flovent inhaler, singulair as outpatient but cannot obtain meds due to cost  Pets: No pets Occupation: Landscaper Exposures: Reports exposure to mold at home around September 2019 Smoking history: 5-pack-year smoker, continues to smoke 2 pack a day.  Also smokes cannabis and vapes THC.  His last vaping was on day of admission on 02/16/2018 Travel history: No significant travel history Relevant family history: No significant history of lung disease.  Outpatient Encounter Medications as of 02/28/2018  Medication Sig  . albuterol (PROVENTIL HFA;VENTOLIN HFA) 108 (90 Base) MCG/ACT inhaler Inhale 2 puffs into the lungs every 4 (four) hours as needed for wheezing or shortness of breath.  Marland Kitchen albuterol (PROVENTIL) (2.5 MG/3ML) 0.083% nebulizer solution Take 3 mLs (2.5 mg total) by nebulization 3 (three) times daily.  Marland Kitchen albuterol (PROVENTIL) (5 MG/ML) 0.5% nebulizer solution Take 0.5 mLs (2.5 mg total) by nebulization every 2 (two) hours as needed for wheezing or shortness of breath.  . ALPRAZolam (XANAX) 0.5 MG tablet Take 1 tablet (0.5 mg total) by mouth every 8 (eight) hours as needed for anxiety or sleep.  . cetirizine (ZYRTEC) 10 MG tablet Take 1 tablet (10 mg total) by mouth daily.  . cholecalciferol (VITAMIN D) 1000 units tablet Take 1,000 Units by mouth daily.  . fluticasone (FLOVENT HFA) 110 MCG/ACT  inhaler Inhale 2 puffs into the lungs 2 (two) times daily.  . montelukast (SINGULAIR) 10 MG tablet Take 1 tablet (10 mg total) by mouth at bedtime.  . nicotine (NICODERM CQ - DOSED IN MG/24 HOURS) 14 mg/24hr patch Place 1 patch (14 mg total) onto the skin daily.  Marland Kitchen omeprazole (PRILOSEC) 20 MG capsule Take 1 capsule (20 mg total) by mouth daily.  . predniSONE (DELTASONE) 20 MG tablet Take 1-2 tablets (20-40 mg total) by mouth daily with breakfast. TAke 40mg  daily for 2days then 20mg  daily for 2days   No facility-administered encounter medications on file as of 02/28/2018.     Allergies as of 02/28/2018 - Review Complete 02/28/2018  Allergen Reaction Noted  . Keflex [cephalexin] Diarrhea, Nausea And Vomiting, and Rash 06/18/2017    Past Medical History:  Diagnosis Date  . Asthma   . COPD (chronic obstructive pulmonary disease) (HCC)     Past Surgical History:  Procedure Laterality Date  . ABDOMINAL SURGERY    . HERNIA REPAIR      Family History  Problem Relation Age of Onset  . Diabetes Mother   . Hypertension Father     Social History   Socioeconomic History  . Marital status: Married    Spouse name: Not on file  . Number of children: Not on file  . Years of education: Not on file  . Highest education level: Not on file  Occupational History  . Not on file  Social Needs  . Financial resource strain: Not on file  . Food insecurity:    Worry: Not on file  Inability: Not on file  . Transportation needs:    Medical: Not on file    Non-medical: Not on file  Tobacco Use  . Smoking status: Current Some Day Smoker    Packs/day: 0.50    Types: Cigarettes  . Smokeless tobacco: Never Used  . Tobacco comment: 6 cigarettes/day 02/28/2018  Substance and Sexual Activity  . Alcohol use: Yes    Comment: occasion  . Drug use: Yes    Types: Marijuana  . Sexual activity: Yes  Lifestyle  . Physical activity:    Days per week: Not on file    Minutes per session: Not on  file  . Stress: Not on file  Relationships  . Social connections:    Talks on phone: Not on file    Gets together: Not on file    Attends religious service: Not on file    Active member of club or organization: Not on file    Attends meetings of clubs or organizations: Not on file    Relationship status: Not on file  . Intimate partner violence:    Fear of current or ex partner: Not on file    Emotionally abused: Not on file    Physically abused: Not on file    Forced sexual activity: Not on file  Other Topics Concern  . Not on file  Social History Narrative   ** Merged History Encounter **        Review of systems: Review of Systems  Constitutional: Negative for fever and chills.  HENT: Negative.   Eyes: Negative for blurred vision.  Respiratory: as per HPI  Cardiovascular: Negative for chest pain and palpitations.  Gastrointestinal: Negative for vomiting, diarrhea, blood per rectum. Genitourinary: Negative for dysuria, urgency, frequency and hematuria.  Musculoskeletal: Negative for myalgias, back pain and joint pain.  Skin: Negative for itching and rash.  Neurological: Negative for dizziness, tremors, focal weakness, seizures and loss of consciousness.  Endo/Heme/Allergies: Negative for environmental allergies.  Psychiatric/Behavioral: Negative for depression, suicidal ideas and hallucinations.  All other systems reviewed and are negative.  Physical Exam: Blood pressure 118/78, pulse 88, resp. rate 18, height 5\' 10"  (1.778 m), weight 180 lb 3.2 oz (81.7 kg), SpO2 98 %. Gen:      No acute distress HEENT:  EOMI, sclera anicteric Neck:     No masses; no thyromegaly Lungs:    Clear to auscultation bilaterally; normal respiratory effort CV:         Regular rate and rhythm; no murmurs Abd:      + bowel sounds; soft, non-tender; no palpable masses, no distension Ext:    No edema; adequate peripheral perfusion Skin:      Warm and dry; no rash Neuro: alert and oriented x  3 Psych: normal mood and affect  Data Reviewed: Imaging: Chest x-ray 02/16/2018- mild hyperinflation, clear lungs.  I reviewed the images personally  PFTs: Pending  FENO 02/28/2018-23  Labs: CBC 09/20/2017-WBC 4.4, eos 7%, absolute eosinophil count 308.  Assessment:  Recurrent respiratory failure Unclear if this is asthma, COPD exacerbation  versus vape related lung injury.  He has no PFTs on record  Emphasized to him that he needs to quit vaping THC right now and work on smoking cessation as well We will give him samples of Trelegy inhaler and a coupon to help with expense of inhaler  He will need to get full pulmonary function test, blood test including CBC with differential, IgE for baseline evaluation. We will wait until  he is off prednisone altogether and also after he is able to get on insurance [he is currently uninsured]  Plan/Recommendations: - Samples, coupon for patient assistance to start trelegy - Continue prednisone taper - Follow-up in 1-2 months   Chilton Greathouse MD [facility] 02/28/2018, 10:51 AM  CC: Kallie Locks, [name]

## 2018-03-19 MED FILL — OMEPRAZOLE 20 MG CAP: 20 | 30 days supply | Qty: 30 | Fill #1

## 2018-03-26 ENCOUNTER — Encounter: Payer: Self-pay | Admitting: Family Medicine

## 2018-03-26 ENCOUNTER — Ambulatory Visit (INDEPENDENT_AMBULATORY_CARE_PROVIDER_SITE_OTHER): Payer: Self-pay | Admitting: Family Medicine

## 2018-03-26 VITALS — BP 118/72 | HR 82 | Temp 98.1°F | Ht 70.0 in | Wt 189.6 lb

## 2018-03-26 DIAGNOSIS — J45909 Unspecified asthma, uncomplicated: Secondary | ICD-10-CM

## 2018-03-26 DIAGNOSIS — Z716 Tobacco abuse counseling: Secondary | ICD-10-CM

## 2018-03-26 DIAGNOSIS — R0602 Shortness of breath: Secondary | ICD-10-CM

## 2018-03-26 DIAGNOSIS — R05 Cough: Secondary | ICD-10-CM

## 2018-03-26 DIAGNOSIS — Z09 Encounter for follow-up examination after completed treatment for conditions other than malignant neoplasm: Secondary | ICD-10-CM

## 2018-03-26 DIAGNOSIS — R059 Cough, unspecified: Secondary | ICD-10-CM

## 2018-03-26 DIAGNOSIS — F419 Anxiety disorder, unspecified: Secondary | ICD-10-CM

## 2018-03-26 MED ORDER — BUSPIRONE HCL 5 MG PO TABS: 5.0000 mg | ORAL_TABLET | Freq: Two times a day (BID) | ORAL | 2 refills | Status: DC

## 2018-03-26 MED FILL — busPIRone HCL 5 MG TABS: 5 | 30 days supply | Qty: 60 | Fill #0

## 2018-03-26 NOTE — Patient Instructions (Signed)
Buspirone tablets What is this medicine? BUSPIRONE (byoo SPYE rone) is used to treat anxiety disorders. This medicine may be used for other purposes; ask your health care provider or pharmacist if you have questions. COMMON BRAND NAME(S): BuSpar What should I tell my health care provider before I take this medicine? They need to know if you have any of these conditions: -kidney or liver disease -an unusual or allergic reaction to buspirone, other medicines, foods, dyes, or preservatives -pregnant or trying to get pregnant -breast-feeding How should I use this medicine? Take this medicine by mouth with a glass of water. Follow the directions on the prescription label. You may take this medicine with or without food. To ensure that this medicine always works the same way for you, you should take it either always with or always without food. Take your doses at regular intervals. Do not take your medicine more often than directed. Do not stop taking except on the advice of your doctor or health care professional. Talk to your pediatrician regarding the use of this medicine in children. Special care may be needed. Overdosage: If you think you have taken too much of this medicine contact a poison control center or emergency room at once. NOTE: This medicine is only for you. Do not share this medicine with others. What if I miss a dose? If you miss a dose, take it as soon as you can. If it is almost time for your next dose, take only that dose. Do not take double or extra doses. What may interact with this medicine? Do not take this medicine with any of the following medications: -linezolid -MAOIs like Carbex, Eldepryl, Marplan, Nardil, and Parnate -methylene blue -procarbazine This medicine may also interact with the following medications: -diazepam -digoxin -diltiazem -erythromycin -grapefruit juice -haloperidol -medicines for mental depression or mood problems -medicines for seizures like  carbamazepine, phenobarbital and phenytoin -nefazodone -other medications for anxiety -rifampin -ritonavir -some antifungal medicines like itraconazole, ketoconazole, and voriconazole -verapamil -warfarin This list may not describe all possible interactions. Give your health care provider a list of all the medicines, herbs, non-prescription drugs, or dietary supplements you use. Also tell them if you smoke, drink alcohol, or use illegal drugs. Some items may interact with your medicine. What should I watch for while using this medicine? Visit your doctor or health care professional for regular checks on your progress. It may take 1 to 2 weeks before your anxiety gets better. You may get drowsy or dizzy. Do not drive, use machinery, or do anything that needs mental alertness until you know how this drug affects you. Do not stand or sit up quickly, especially if you are an older patient. This reduces the risk of dizzy or fainting spells. Alcohol can make you more drowsy and dizzy. Avoid alcoholic drinks. What side effects may I notice from receiving this medicine? Side effects that you should report to your doctor or health care professional as soon as possible: -blurred vision or other vision changes -chest pain -confusion -difficulty breathing -feelings of hostility or anger -muscle aches and pains -numbness or tingling in hands or feet -ringing in the ears -skin rash and itching -vomiting -weakness Side effects that usually do not require medical attention (report to your doctor or health care professional if they continue or are bothersome): -disturbed dreams, nightmares -headache -nausea -restlessness or nervousness -sore throat and nasal congestion -stomach upset This list may not describe all possible side effects. Call your doctor for medical advice about side   effects. You may report side effects to FDA at 1-800-FDA-1088. Where should I keep my medicine? Keep out of the reach  of children. Store at room temperature below 30 degrees C (86 degrees F). Protect from light. Keep container tightly closed. Throw away any unused medicine after the expiration date. NOTE: This sheet is a summary. It may not cover all possible information. If you have questions about this medicine, talk to your doctor, pharmacist, or health care provider.  2018 Elsevier/Gold Standard (2009-11-26 18:06:11)  

## 2018-03-26 NOTE — Progress Notes (Signed)
Follow Up  Subjective:    Patient ID: Aaron [name], male    DOB: [birth year], 47 y.o.   MRN: [id number]  Chief Complaint  Patient presents with  . Follow-up    Asthma   HPI  Mr. Aaron [name] is a 47 year old male with a past medical history of COPD and Asthma. He is here today for follow up of chronic diseases.  Current Status: Since his last office visit, he is doing well with no complaints. He is currently following up with [facility]. His last office visit was 02/28/2018, with a follow up visit on 04/05/2018. He was prescribed Trelegy X month, to d/c Flovent until completed Trelegy, then restart Flovent as directed. He continues to smoke approximately 1 pack every 3 days. He has not began Nicotine Patches. He has occasional shortness of breath and cough. No chest pain, heart palpitations reported.   He denies fevers, chills, fatigue, recent infections, weight loss, and night sweats. He has not had any headaches, visual changes, dizziness, and falls. No reports of GI problems such as nausea, vomiting, diarrhea, and constipation. he has no reports of blood in stools, dysuria and hematuria. No depression or anxiety reported.  He denies pain today.   Past Medical History:  Diagnosis Date  . Asthma   . COPD (chronic obstructive pulmonary disease) (HCC)     Family History  Problem Relation Age of Onset  . Diabetes Mother   . Hypertension Father     Social History   Socioeconomic History  . Marital status: Married    Spouse name: Not on file  . Number of children: Not on file  . Years of education: Not on file  . Highest education level: Not on file  Occupational History  . Not on file  Social Needs  . Financial resource strain: Not on file  . Food insecurity:    Worry: Not on file    Inability: Not on file  . Transportation needs:    Medical: Not on file    Non-medical: Not on file  Tobacco Use  . Smoking status: Current Some Day Smoker    Packs/day: 0.50    Types:  Cigarettes  . Smokeless tobacco: Never Used  . Tobacco comment: 6 cigarettes/day 02/28/2018  Substance and Sexual Activity  . Alcohol use: Yes    Comment: occasion  . Drug use: Yes    Types: Marijuana  . Sexual activity: Yes  Lifestyle  . Physical activity:    Days per week: Not on file    Minutes per session: Not on file  . Stress: Not on file  Relationships  . Social connections:    Talks on phone: Not on file    Gets together: Not on file    Attends religious service: Not on file    Active member of club or organization: Not on file    Attends meetings of clubs or organizations: Not on file    Relationship status: Not on file  . Intimate partner violence:    Fear of current or ex partner: Not on file    Emotionally abused: Not on file    Physically abused: Not on file    Forced sexual activity: Not on file  Other Topics Concern  . Not on file  Social History Narrative   ** Merged History Encounter **        Past Surgical History:  Procedure Laterality Date  . ABDOMINAL SURGERY    . HERNIA REPAIR  Immunization History  Administered Date(s) Administered  . Influenza,inj,Quad PF,6+ Mos 02/25/2017, 02/13/2018  . Pneumococcal Polysaccharide-23 02/25/2017  . Tdap 03/15/2017    Current Meds  Medication Sig  . albuterol (PROVENTIL HFA;VENTOLIN HFA) 108 (90 Base) MCG/ACT inhaler Inhale 2 puffs into the lungs every 4 (four) hours as needed for wheezing or shortness of breath.  Marland Kitchen. albuterol (PROVENTIL) (2.5 MG/3ML) 0.083% nebulizer solution Take 3 mLs (2.5 mg total) by nebulization 3 (three) times daily.  Marland Kitchen. albuterol (PROVENTIL) (5 MG/ML) 0.5% nebulizer solution Take 0.5 mLs (2.5 mg total) by nebulization every 2 (two) hours as needed for wheezing or shortness of breath.  . ALPRAZolam (XANAX) 0.5 MG tablet Take 1 tablet (0.5 mg total) by mouth every 8 (eight) hours as needed for anxiety or sleep.  . cetirizine (ZYRTEC) 10 MG tablet Take 1 tablet (10 mg total) by mouth  daily.  . cholecalciferol (VITAMIN D) 1000 units tablet Take 1,000 Units by mouth daily.  . Fluticasone-Umeclidin-Vilant (TRELEGY ELLIPTA) 100-62.5-25 MCG/INH AEPB Inhale 1 puff into the lungs daily.  . montelukast (SINGULAIR) 10 MG tablet Take 1 tablet (10 mg total) by mouth at bedtime.  Marland Kitchen. omeprazole (PRILOSEC) 20 MG capsule Take 1 capsule (20 mg total) by mouth daily.    Allergies  Allergen Reactions  . Keflex [Cephalexin] Diarrhea, Nausea And Vomiting and Rash    hallucinations    BP 118/72 (BP Location: Right Arm, Patient Position: Sitting, Cuff Size: Large)   Pulse 82   Temp 98.1 F (36.7 C) (Oral)   Ht 5\' 10"  (1.778 m)   Wt 189 lb 9.6 oz (86 kg)   SpO2 99%   BMI 27.20 kg/m   Review of Systems  Constitutional: Negative.   HENT: Negative.   Eyes: Negative.   Respiratory: Positive for cough (Occasional ) and shortness of breath (Occasional).   Cardiovascular: Negative.   Gastrointestinal: Negative.   Endocrine: Negative.   Genitourinary: Negative.   Musculoskeletal: Negative.   Skin: Negative.   Allergic/Immunologic: Negative.   Neurological: Positive for dizziness and headaches.  Hematological: Negative.   Psychiatric/Behavioral: Negative.    Objective:   Physical Exam  Constitutional: He is oriented to person, place, and time. He appears well-developed and well-nourished.  HENT:  Head: Normocephalic and atraumatic.  Eyes: Pupils are equal, round, and reactive to light. Conjunctivae and EOM are normal.  Neck: Normal range of motion. Neck supple.  Cardiovascular: Normal rate, regular rhythm, normal heart sounds and intact distal pulses.  Pulmonary/Chest: Effort normal and breath sounds normal.  Abdominal: Soft. Bowel sounds are normal.  Musculoskeletal: Normal range of motion.  Neurological: He is alert and oriented to person, place, and time.  Skin: Skin is warm and dry.  Psychiatric: He has a normal mood and affect. His behavior is normal. Judgment and thought  content normal.  Nursing note and vitals reviewed.  Assessment & Plan:   1. Asthma, unspecified asthma severity, unspecified whether complicated, unspecified whether persistent Stable. No signs of respiratory distress reported or noted today. He will continue following up with Dr. Isaiah SergeMannam at [facility]. Continue Asthma medications as prescribed.   2. Anxiety We will initiate Buspar today.  - busPIRone (BUSPAR) 5 MG tablet; Take 1 tablet (5 mg total) by mouth 2 (two) times daily.  Dispense: 60 tablet; Refill: 2  3. Tobacco abuse counseling He will pick up Nicotine Patches today.   4. Cough  5. Shortness of breath Stable.   6. Follow up He will follow up in 2 months.  Meds ordered this encounter  Medications  . busPIRone (BUSPAR) 5 MG tablet    Sig: Take 1 tablet (5 mg total) by mouth 2 (two) times daily.    Dispense:  60 tablet    Refill:  2    Raliegh Ip,  MSN, [name]-C [facility] [address] [place], [place] [address] [phone]

## 2018-04-05 ENCOUNTER — Ambulatory Visit (INDEPENDENT_AMBULATORY_CARE_PROVIDER_SITE_OTHER): Payer: Self-pay | Admitting: Pulmonary Disease

## 2018-04-05 ENCOUNTER — Encounter: Payer: Self-pay | Admitting: Pulmonary Disease

## 2018-04-05 VITALS — BP 122/72 | HR 82 | Ht 70.0 in | Wt 187.6 lb

## 2018-04-05 DIAGNOSIS — R0602 Shortness of breath: Secondary | ICD-10-CM

## 2018-04-05 DIAGNOSIS — J449 Chronic obstructive pulmonary disease, unspecified: Secondary | ICD-10-CM

## 2018-04-05 MED ORDER — FLUTICASONE-UMECLIDIN-VILANT 100-62.5-25 MCG/INH IN AEPB: 1.0000 | INHALATION_SPRAY | Freq: Every day | RESPIRATORY_TRACT | 0 refills | Status: AC

## 2018-04-05 NOTE — Addendum Note (Signed)
Addended by: Maxwell MarionBLANKENSHIP, Usha Slager A on: 04/05/2018 10:53 AM   Modules accepted: Orders

## 2018-04-05 NOTE — Patient Instructions (Addendum)
Continue the Trelegy inhaler We will give you coupons to help with the cost of the inhaler Use nicotine patches over-the-counter for smoking cessation  Once you get your lung tests done for disability application have them send us a copy. Follow-up in 3 months.

## 2018-04-05 NOTE — Progress Notes (Signed)
Aaron [name]    [id number]    06-11-1970  Primary Care Physician:Stroud, Rolm Gala, [name]  Referring Physician: Kallie Locks, [name] [address] [place], [place] [address]  Chief complaint:   Follow-up for respiratory failure.  HPI: 47 year old with history of vaping THC, asthma, COPD.    History of intubation for asthma/COPD exacerbation, ARDS in February 2019. Admitted with repeat exacerbation on 02/16/2018 with acute exacerbation, dyspnea, wheezing.  This time he turned around quickly with prednisone, nebulizers and was discharged in 1 day. 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  Also smokes cannabis and vapes THC.  His last vaping was on day of admission on 02/16/2018 Travel history: No significant travel history Relevant family history: No significant history of lung disease.   Interim history: Started on Trelegy inhaler at last visit.  He feels that this helps with his breathing. Continues to smoke.  He has not started nicotine patches yet.  States that he is has quit vaping THC.  He is due to be evaluated for disability and has pulmonary tests scheduled this week.  Outpatient Encounter Medications as of 04/05/2018  Medication Sig  . albuterol (PROVENTIL HFA;VENTOLIN HFA) 108 (90 Base) MCG/ACT inhaler Inhale 2 puffs into the lungs every 4 (four) hours as needed for wheezing or shortness of breath.  Marland Kitchen albuterol (PROVENTIL) (2.5 MG/3ML) 0.083% nebulizer solution Take 3 mLs (2.5 mg total) by nebulization 3 (three) times daily.  Marland Kitchen albuterol (PROVENTIL) (5 MG/ML) 0.5% nebulizer solution Take 0.5 mLs (2.5 mg total) by nebulization every 2 (two) hours as needed for wheezing or shortness of breath.  . ALPRAZolam (XANAX) 0.5 MG  tablet Take 1 tablet (0.5 mg total) by mouth every 8 (eight) hours as needed for anxiety or sleep.  . busPIRone (BUSPAR) 5 MG tablet Take 1 tablet (5 mg total) by mouth 2 (two) times daily.  . cetirizine (ZYRTEC) 10 MG tablet Take 1 tablet (10 mg total) by mouth daily.  . cholecalciferol (VITAMIN D) 1000 units tablet Take 1,000 Units by mouth daily.  . Fluticasone-Umeclidin-Vilant (TRELEGY ELLIPTA) 100-62.5-25 MCG/INH AEPB Inhale 1 puff into the lungs daily.  . montelukast (SINGULAIR) 10 MG tablet Take 1 tablet (10 mg total) by mouth at bedtime.  Marland Kitchen omeprazole (PRILOSEC) 20 MG capsule Take 1 capsule (20 mg total) by mouth daily.  . [DISCONTINUED] fluticasone (FLOVENT HFA) 110 MCG/ACT inhaler Inhale 2 puffs into the lungs 2 (two) times daily.  . nicotine (NICODERM CQ - DOSED IN MG/24 HOURS) 14 mg/24hr patch Place 1 patch (14 mg total) onto the skin daily. (Patient not taking: Reported on 04/05/2018)   No facility-administered encounter medications on file as of 04/05/2018.    Physical Exam: Blood pressure 122/72, pulse 82, height 5\' 10"  (1.778 m), weight 187 lb 9.6 oz (85.1 kg), SpO2 95 %. Gen:      No acute distress HEENT:  EOMI, sclera anicteric Neck:     No masses; no thyromegaly Lungs:    Clear to auscultation bilaterally; normal respiratory effort CV:         Regular rate and rhythm; no murmurs Abd:      + bowel sounds; soft, non-tender; no palpable masses, no distension Ext:    No edema; adequate peripheral perfusion Skin:  Warm and dry; no rash Neuro: alert and oriented x 3 Psych: normal mood and affect  Data Reviewed: Imaging: Chest x-ray 02/16/2018- mild hyperinflation, clear lungs.  I reviewed the images personally  PFTs: Pending  FENO 02/28/2018-23  Labs: CBC 09/20/2017-WBC 4.4, eos 7%, absolute eosinophil count 308.  Assessment:  Recurrent respiratory failure Unclear if this is asthma, COPD exacerbation  versus vape related lung injury.  He has no PFTs on  record Continue Trelegy inhaler.  Give coupon to help with expensive inhalers  He needs pulmonary function test for evaluation of COPD, asthma.  Since he is uninsured and he has lung test coming up for his disability application we will hold off on ordering from our office. I have asked him to fax the results of the lung test to us when completed.  Plan/Recommendations: - Continue Trelegy - Follow results of lung tests for disability application.   Chilton GreathousePraveen Aiza Vollrath MD [facility] 04/05/2018, 10:24 AM  CC: [name], [name], [name]

## 2018-05-10 MED FILL — $VENTOLIN HFA 18G INHALER: 108 (90 BAS | 75 days supply | Qty: 54 | Fill #1

## 2018-05-22 ENCOUNTER — Telehealth: Payer: Self-pay | Admitting: Pulmonary Disease

## 2018-05-22 MED ORDER — FLUTICASONE-UMECLIDIN-VILANT 100-62.5-25 MCG/INH IN AEPB: 1.0000 | INHALATION_SPRAY | Freq: Every day | RESPIRATORY_TRACT | 0 refills | Status: DC

## 2018-05-22 MED ORDER — FLUTICASONE-UMECLIDIN-VILANT 100-62.5-25 MCG/INH IN AEPB: 1.0000 | INHALATION_SPRAY | Freq: Every day | RESPIRATORY_TRACT | 5 refills | Status: DC

## 2018-05-22 NOTE — Telephone Encounter (Signed)
Spoke with Patient.  He was requesting a refill and a sample of Trelegy. Requested pharmacy is [facility].last OV was with Dr. Isaiah Serge, 04/05/18.  Trelegy sample given.  Prescription sent to requested pharmacy. Nothing further this time.

## 2018-05-22 NOTE — Telephone Encounter (Signed)
Patient is in the lobby needing help with his inhaler prescription//fmb

## 2018-05-28 ENCOUNTER — Encounter: Payer: Self-pay | Admitting: Family Medicine

## 2018-05-28 ENCOUNTER — Ambulatory Visit (INDEPENDENT_AMBULATORY_CARE_PROVIDER_SITE_OTHER): Payer: Self-pay | Admitting: Family Medicine

## 2018-05-28 VITALS — BP 128/78 | HR 96 | Temp 98.2°F | Ht 70.0 in | Wt 192.0 lb

## 2018-05-28 DIAGNOSIS — M25511 Pain in right shoulder: Secondary | ICD-10-CM | POA: Insufficient documentation

## 2018-05-28 DIAGNOSIS — R0602 Shortness of breath: Secondary | ICD-10-CM

## 2018-05-28 DIAGNOSIS — Z716 Tobacco abuse counseling: Secondary | ICD-10-CM

## 2018-05-28 DIAGNOSIS — F17200 Nicotine dependence, unspecified, uncomplicated: Secondary | ICD-10-CM

## 2018-05-28 DIAGNOSIS — K2100 Gastro-esophageal reflux disease with esophagitis, without bleeding: Secondary | ICD-10-CM

## 2018-05-28 DIAGNOSIS — J45909 Unspecified asthma, uncomplicated: Secondary | ICD-10-CM

## 2018-05-28 DIAGNOSIS — F419 Anxiety disorder, unspecified: Secondary | ICD-10-CM

## 2018-05-28 DIAGNOSIS — R05 Cough: Secondary | ICD-10-CM

## 2018-05-28 DIAGNOSIS — R059 Cough, unspecified: Secondary | ICD-10-CM

## 2018-05-28 DIAGNOSIS — M25512 Pain in left shoulder: Secondary | ICD-10-CM

## 2018-05-28 DIAGNOSIS — Z09 Encounter for follow-up examination after completed treatment for conditions other than malignant neoplasm: Secondary | ICD-10-CM

## 2018-05-28 MED ORDER — CYCLOBENZAPRINE HCL 10 MG PO TABS: 10.0000 mg | ORAL_TABLET | Freq: Two times a day (BID) | ORAL | 3 refills | Status: DC | PRN

## 2018-05-28 MED ORDER — OMEPRAZOLE 20 MG PO CPDR: 20.0000 mg | DELAYED_RELEASE_CAPSULE | Freq: Every day | ORAL | 3 refills | Status: DC

## 2018-05-28 MED ORDER — FLUTICASONE-UMECLIDIN-VILANT 100-62.5-25 MCG/INH IN AEPB: 1.0000 | INHALATION_SPRAY | Freq: Every day | RESPIRATORY_TRACT | 5 refills | Status: DC

## 2018-05-28 MED ORDER — BUSPIRONE HCL 10 MG PO TABS: 10.0000 mg | ORAL_TABLET | Freq: Two times a day (BID) | ORAL | 2 refills | Status: DC

## 2018-05-28 MED FILL — busPIRone HCL 10 MG TABS: 10 | 30 days supply | Qty: 60 | Fill #0

## 2018-05-28 MED FILL — $TRELEGY ELLIPTA 100-62.5-2: 100-62.5-25 | 90 days supply | Qty: 180 | Fill #0

## 2018-05-28 MED FILL — OMEPRAZOLE 20 MG CAP: 20 | 30 days supply | Qty: 30 | Fill #0

## 2018-05-28 MED FILL — CYCLOBENZAPRINE 10 MG TAB: 10 | 15 days supply | Qty: 30 | Fill #0
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
## 2018-05-28 NOTE — Patient Instructions (Signed)
Smoking Tobacco Information, Adult Smoking tobacco can be harmful to your health. Tobacco contains a poisonous (toxic), colorless chemical called nicotine. Nicotine is addictive. It changes the brain and can make it hard to stop smoking. Tobacco also has other toxic chemicals that can hurt your body and raise your risk of many cancers. How can smoking tobacco affect me? Smoking tobacco puts you at risk for:  Cancer. Smoking is most commonly associated with lung cancer, but can also lead to cancer in other parts of the body.  Chronic obstructive pulmonary disease (COPD). This is a long-term lung condition that makes it hard to breathe. It also gets worse over time.  High blood pressure (hypertension), heart disease, stroke, or heart attack.  Lung infections, such as pneumonia.  Cataracts. This is when the lenses in the eyes become clouded.  Digestive problems. This may include peptic ulcers, heartburn, and gastroesophageal reflux disease (GERD).  Oral health problems, such as gum disease and tooth loss.  Loss of taste and smell. Smoking can affect your appearance by causing:  Wrinkles.  Yellow or stained teeth, fingers, and fingernails. Smoking tobacco can also affect your social life, because:  It may be challenging to find places to smoke when away from home. Many workplaces, restaurants, hotels, and public places are tobacco-free.  Smoking is expensive. This is due to the cost of tobacco and the long-term costs of treating health problems from smoking.  Secondhand smoke may affect those around you. Secondhand smoke can cause lung cancer, breathing problems, and heart disease. Children of smokers have a higher risk for: ? Sudden infant death syndrome (SIDS). ? Ear infections. ? Lung infections. If you currently smoke tobacco, quitting now can help you:  Lead a longer and healthier life.  Look, smell, breathe, and feel better over time.  Save money.  Protect others from the  harms of secondhand smoke. What actions can I take to prevent health problems? Quit smoking   Do not start smoking. Quit if you already do.  Make a plan to quit smoking and commit to it. Look for programs to help you and ask your health care provider for recommendations and ideas.  Set a date and write down all the reasons you want to quit.  Let your friends and family know you are quitting so they can help and support you. Consider finding friends who also want to quit. It can be easier to quit with someone else, so that you can support each other.  Talk with your health care provider about using nicotine replacement medicines to help you quit, such as gum, lozenges, patches, sprays, or pills.  Do not replace cigarette smoking with electronic cigarettes, which are commonly called e-cigarettes. The safety of e-cigarettes is not known, and some may contain harmful chemicals.  If you try to quit but return to smoking, stay positive. It is common to slip up when you first quit, so take it one day at a time.  Be prepared for cravings. When you feel the urge to smoke, chew gum or suck on hard candy. Lifestyle  Stay busy and take care of your body.  Drink enough fluid to keep your urine pale yellow.  Get plenty of exercise and eat a healthy diet. This can help prevent weight gain after quitting.  Monitor your eating habits. Quitting smoking can cause you to have a larger appetite than when you smoke.  Find ways to relax. Go out with friends or family to a movie or a restaurant   where people do not smoke.  Ask your health care provider about having regular tests (screenings) to check for cancer. This may include blood tests, imaging tests, and other tests.  Find ways to manage your stress, such as meditation, yoga, or exercise. Where to find support To get support to quit smoking, consider:  Asking your health care provider for more information and resources.  Taking classes to learn  more about quitting smoking.  Looking for local organizations that offer resources about quitting smoking.  Joining a support group for people who want to quit smoking in your local community.  Calling the smokefree.gov counselor helpline: 1-800-Quit-Now (775)049-2693(1-930 294 4998) Where to find more information You may find more information about quitting smoking from:  HelpGuide.org: www.helpguide.org  BankRights.uySmokefree.gov: smokefree.gov  American Lung Association: www.lung.org Contact a health care provider if you:  Have problems breathing.  Notice that your lips, nose, or fingers turn blue.  Have chest pain.  Are coughing up blood.  Feel faint or you pass out.  Have other health changes that cause you to worry. Summary  Smoking tobacco can negatively affect your health, the health of those around you, your finances, and your social life.  Do not start smoking. Quit if you already do. If you need help quitting, ask your health care provider.  Think about joining a support group for people who want to quit smoking in your local community. There are many effective programs that will help you to quit this behavior. This information is not intended to replace advice given to you by your health care provider. Make sure you discuss any questions you have with your health care provider. Document Released: 05/03/2016 Document Revised: 06/07/2017 Document Reviewed: 05/03/2016 Elsevier Interactive Patient Education  2019 Elsevier Inc. Muscle Cramps and Spasms Muscle cramps and spasms are when muscles tighten by themselves. They usually get better within minutes. Muscle cramps are painful. They are usually stronger and last longer than muscle spasms. Muscle spasms may or may not be painful. They can last a few seconds or much longer. Cramps and spasms can affect any muscle, but they occur most often in the calf muscles of the leg. They are usually not caused by a serious problem. In many cases, the  cause is not known. Some common causes include:  Doing more physical work or exercise than your body is ready for.  Using the muscles too much (overuse) by repeating certain movements too many times.  Staying in a certain position for a long time.  Playing a sport or doing an activity without preparing properly.  Using bad form or technique while playing a sport or doing an activity.  Not having enough water in your body (dehydration).  Injury.  Side effects of some medicines.  Low levels of the salts and minerals in your blood (electrolytes), such as low potassium or calcium. Follow these instructions at home: Managing pain and stiffness      Massage, stretch, and relax the muscle. Do this for many minutes at a time.  If told, put heat on tight or tense muscles as often as told by your doctor. Use the heat source that your doctor recommends, such as a moist heat pack or a heating pad. ? Place a towel between your skin and the heat source. ? Leave the heat on for 20-30 minutes. ? Remove the heat if your skin turns bright red. This is very important if you are not able to feel pain, heat, or cold. You may have a  greater risk of getting burned.  If told, put ice on the affected area. This may help if you are sore or have pain after a cramp or spasm. ? Put ice in a plastic bag. ? Place a towel between your skin and the bag. ? Leave the ice on for 20 minutes, 2-3 times a day.  Try taking hot showers or baths to help relax tight muscles. Eating and drinking  Drink enough fluid to keep your pee (urine) pale yellow.  Eat a healthy diet to help ensure that your muscles work well. This should include: ? Fruits and vegetables. ? Lean protein. ? Whole grains. ? Low-fat or nonfat dairy products. General instructions  If you are having cramps often, avoid intense exercise for several days.  Take over-the-counter and prescription medicines only as told by your doctor.  Watch  for any changes in your symptoms.  Keep all follow-up visits as told by your doctor. This is important. Contact a doctor if:  Your cramps or spasms get worse or happen more often.  Your cramps or spasms do not get better with time. Summary  Muscle cramps and spasms are when muscles tighten by themselves. They usually get better within minutes.  Cramps and spasms occur most often in the calf muscles of the leg.  Massage, stretch, and relax the muscle. This may help the cramp or spasm go away.  Drink enough fluid to keep your pee (urine) pale yellow. This information is not intended to replace advice given to you by your health care provider. Make sure you discuss any questions you have with your health care provider. Document Released: 03/31/2008 Document Revised: 09/11/2017 Document Reviewed: 09/11/2017 Elsevier Interactive Patient Education  2019 [employer] Inc. Shoulder Pain Many things can cause shoulder pain, including:  An injury.  Moving the shoulder in the same way again and again (overuse).  Joint pain (arthritis). Pain can come from:  Swelling and irritation (inflammation) of any part of the shoulder.  An injury to the shoulder joint.  An injury to: ? Tissues that connect muscle to bone (tendons). ? Tissues that connect bones to each other (ligaments). ? Bones. Follow these instructions at home: Watch for changes in your symptoms. Let your doctor know about them. Follow these instructions to help with your pain. If you have a sling:  Wear the sling as told by your doctor. Remove it only as told by your doctor.  Loosen the sling if your fingers: ? Tingle. ? Become numb. ? Turn cold and blue.  Keep the sling clean.  If the sling is not waterproof: ? Do not let it get wet. ? Take the sling off when you shower or bathe. Managing pain, stiffness, and swelling   If told, put ice on the painful area: ? Put ice in a plastic bag. ? Place a towel between your  skin and the bag. ? Leave the ice on for 20 minutes, 2-3 times a day. Stop putting ice on if it does not help with the pain.  Squeeze a soft ball or a foam pad as much as possible. This prevents swelling in the shoulder. It also helps to strengthen the arm. General instructions  Take over-the-counter and prescription medicines only as told by your doctor.  Keep all follow-up visits as told by your doctor. This is important. Contact a doctor if:  Your pain gets worse.  Medicine does not help your pain.  You have new pain in your arm, hand, or fingers. Get  help right away if:  Your arm, hand, or fingers: ? Tingle. ? Are numb. ? Are swollen. ? Are painful. ? Turn white or blue. Summary  Shoulder pain can be caused by many things. These include injury, moving the shoulder in the same away again and again, and joint pain.  Watch for changes in your symptoms. Let your doctor know about them.  This condition may be treated with a sling, ice, and pain medicine.  Contact your doctor if the pain gets worse or you have new pain. Get help right away if your arm, hand, or fingers tingle or get numb, swollen, or painful.  Keep all follow-up visits as told by your doctor. This is important. This information is not intended to replace advice given to you by your health care provider. Make sure you discuss any questions you have with your health care provider. Document Released: 10/05/2007 Document Revised: 10/31/2017 Document Reviewed: 10/31/2017 Elsevier Interactive Patient Education  2019 [employer]

## 2018-07-09 ENCOUNTER — Ambulatory Visit (INDEPENDENT_AMBULATORY_CARE_PROVIDER_SITE_OTHER): Payer: Self-pay | Admitting: Pulmonary Disease

## 2018-07-09 ENCOUNTER — Encounter: Payer: Self-pay | Admitting: Pulmonary Disease

## 2018-07-09 ENCOUNTER — Other Ambulatory Visit: Payer: Self-pay

## 2018-07-09 ENCOUNTER — Ambulatory Visit (INDEPENDENT_AMBULATORY_CARE_PROVIDER_SITE_OTHER)
Admission: RE | Admit: 2018-07-09 | Discharge: 2018-07-09 | Disposition: A | Discharge status: Home / Self Care | Payer: Self-pay | Source: Ambulatory Visit | Attending: Pulmonary Disease | Admitting: Pulmonary Disease

## 2018-07-09 VITALS — BP 130/80 | HR 94 | Ht 70.0 in | Wt 202.0 lb

## 2018-07-09 DIAGNOSIS — J449 Chronic obstructive pulmonary disease, unspecified: Secondary | ICD-10-CM

## 2018-07-09 DIAGNOSIS — R0602 Shortness of breath: Secondary | ICD-10-CM

## 2018-07-09 MED ORDER — PREDNISONE 10 MG PO TABS: 10.0000 mg | ORAL_TABLET | Freq: Every day | ORAL | 0 refills | Status: DC

## 2018-07-09 MED FILL — predniSONE 10 MG TABS: 10 | 12 days supply | Qty: 30 | Fill #0

## 2018-07-09 NOTE — Patient Instructions (Signed)
We will get a spirometry today and chest x-ray Continue the Trelegy inhaler We will start you on prednisone taper starting at 40 mg.  Reduce dose by 10 mg every 3 days Continue to work on smoking cessation.  We will give you nicotine patches to help with that.  Follow-up in 3 months

## 2018-07-09 NOTE — Progress Notes (Signed)
Aaron [name]    [id number]    11-16-1970  Primary Care Physician:Stroud, Rolm Gala, [name]  Referring Physician: Kallie Locks, [name] [address] [place], [place] [address]  Chief complaint:   Follow-up for respiratory failure.  HPI: 48 year old with history of vaping THC, asthma, COPD.    History of intubation for asthma/COPD exacerbation, ARDS in February 2019. Admitted with repeat exacerbation on 02/16/2018 with acute exacerbation, dyspnea, wheezing.  This time he turned around quickly with prednisone, nebulizers and was discharged in 1 day. 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  Also smokes cannabis and vapes THC.  His last vaping was on day of admission on 02/16/2018 Travel history: No significant travel history Relevant family history: No significant history of lung disease.   Interim history:  Continues on Trelegy inhaler.  Is that breathing is up-and-down.  Has chronic cough with white mucus, dyspnea, wheezing  He was evaluated at disability with PFTs and told that they were abnormal.  He does not have any more details on those and I do not have the test to review  Continues to smoke but wants to quit.  Willing to try nicotine patches.  Outpatient Encounter Medications as of 07/09/2018  Medication Sig  . albuterol (PROVENTIL HFA;VENTOLIN HFA) 108 (90 Base) MCG/ACT inhaler Inhale 2 puffs into the lungs every 4 (four) hours as needed for wheezing or shortness of breath.  Marland Kitchen albuterol (PROVENTIL) (2.5 MG/3ML) 0.083% nebulizer solution Take 3 mLs (2.5 mg total) by nebulization 3 (three) times daily.  Marland Kitchen ALPRAZolam (XANAX) 0.5 MG tablet Take 1 tablet (0.5 mg total) by mouth every 8 (eight) hours as needed for anxiety or sleep.  .  busPIRone (BUSPAR) 10 MG tablet Take 1 tablet (10 mg total) by mouth 2 (two) times daily.  . cetirizine (ZYRTEC) 10 MG tablet Take 1 tablet (10 mg total) by mouth daily.  . cholecalciferol (VITAMIN D) 1000 units tablet Take 1,000 Units by mouth daily.  . cyclobenzaprine (FLEXERIL) 10 MG tablet Take 1 tablet (10 mg total) by mouth 2 (two) times daily as needed for muscle spasms.  . Fluticasone-Umeclidin-Vilant (TRELEGY ELLIPTA) 100-62.5-25 MCG/INH AEPB Inhale 1 puff into the lungs daily.  Marland Kitchen omeprazole (PRILOSEC) 20 MG capsule Take 1 capsule (20 mg total) by mouth daily.  . montelukast (SINGULAIR) 10 MG tablet Take 1 tablet (10 mg total) by mouth at bedtime.  . [DISCONTINUED] nicotine (NICODERM CQ - DOSED IN MG/24 HOURS) 14 mg/24hr patch Place 1 patch (14 mg total) onto the skin daily. (Patient not taking: Reported on 07/09/2018)   No facility-administered encounter medications on file as of 07/09/2018.    Physical Exam: Blood pressure 130/80, pulse 94, height 5\' 10"  (1.778 m), weight 202 lb (91.6 kg), SpO2 95 %. Gen:      No acute distress HEENT:  EOMI, sclera anicteric Neck:     No masses; no thyromegaly Lungs:    Bilateral expiratory wheeze, crackles. CV:         Regular rate and rhythm; no murmurs Abd:      + bowel sounds; soft, non-tender; no palpable masses, no distension Ext:    No edema; adequate peripheral perfusion Skin:      Warm and dry; no rash Neuro: alert and  oriented x 3 Psych: normal mood and affect  Data Reviewed: Imaging: Chest x-ray 02/16/2018- mild hyperinflation, clear lungs.  I reviewed the images personally  PFTs: Pending  FENO 02/28/2018-23  Labs: CBC 09/20/2017-WBC 4.4, eos 7%, absolute eosinophil count 308.  Assessment:  Recurrent respiratory failure Unclear if this is asthma, COPD exacerbation  versus vape related lung injury.  He has no PFTs on record Continue Trelegy inhaler.   He is wheezing on examination.  We will treat this with a prednisone taper  starting at 40 mg.  Reduce dose by 10 mg every 3 days.  We will get chest x-ray today and PFTs Try to obtain records of full PFTs.obtain at the disability office.  Active smoker Smoking cessation discussed in detail.  Prescribe nicotine patches.  Reassess at next visit Time spent counseling-5 minutes.  Plan/Recommendations: - Continue Trelegy - Spirometry, chest x-ray - Prednisone taper. - Smoking cessation with nicotine patches.   Chilton Greathouse MD [facility] 07/09/2018, 10:04 AM  CC: Kallie Locks, [name]

## 2018-07-17 MED FILL — !VENTOLIN HFA INHALER: 108 (90 BAS | 25 days supply | Qty: 18 | Fill #2

## 2018-07-27 ENCOUNTER — Ambulatory Visit (INDEPENDENT_AMBULATORY_CARE_PROVIDER_SITE_OTHER): Payer: Self-pay | Admitting: Family Medicine

## 2018-07-27 ENCOUNTER — Encounter: Payer: Self-pay | Admitting: Family Medicine

## 2018-07-27 ENCOUNTER — Other Ambulatory Visit: Payer: Self-pay

## 2018-07-27 DIAGNOSIS — F17200 Nicotine dependence, unspecified, uncomplicated: Secondary | ICD-10-CM

## 2018-07-27 DIAGNOSIS — J302 Other seasonal allergic rhinitis: Secondary | ICD-10-CM

## 2018-07-27 DIAGNOSIS — J4530 Mild persistent asthma, uncomplicated: Secondary | ICD-10-CM

## 2018-07-27 DIAGNOSIS — K2100 Gastro-esophageal reflux disease with esophagitis, without bleeding: Secondary | ICD-10-CM

## 2018-07-27 DIAGNOSIS — J45909 Unspecified asthma, uncomplicated: Secondary | ICD-10-CM

## 2018-07-27 MED ORDER — OMEPRAZOLE 20 MG PO CPDR: 20.0000 mg | DELAYED_RELEASE_CAPSULE | Freq: Every day | ORAL | 6 refills | Status: DC

## 2018-07-27 MED ORDER — CETIRIZINE HCL 10 MG PO TABS: 10.0000 mg | ORAL_TABLET | Freq: Every day | ORAL | 11 refills | Status: DC

## 2018-07-27 MED ORDER — VARENICLINE TARTRATE 1 MG PO TABS: 1.0000 mg | ORAL_TABLET | Freq: Two times a day (BID) | ORAL | 0 refills | Status: DC

## 2018-07-27 MED ORDER — ALBUTEROL SULFATE (2.5 MG/3ML) 0.083% IN NEBU: 2.5000 mg | INHALATION_SOLUTION | Freq: Three times a day (TID) | RESPIRATORY_TRACT | 11 refills | Status: DC

## 2018-07-27 MED ORDER — MONTELUKAST SODIUM 10 MG PO TABS: 10.0000 mg | ORAL_TABLET | Freq: Every day | ORAL | 6 refills | Status: DC

## 2018-07-27 MED ORDER — FLUTICASONE-UMECLIDIN-VILANT 100-62.5-25 MCG/INH IN AEPB: 1.0000 | INHALATION_SPRAY | Freq: Every day | RESPIRATORY_TRACT | 11 refills | Status: DC

## 2018-07-27 MED ORDER — ALBUTEROL SULFATE HFA 108 (90 BASE) MCG/ACT IN AERS: 2.0000 | INHALATION_SPRAY | RESPIRATORY_TRACT | 11 refills | Status: DC | PRN
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
## 2018-09-06 NOTE — Telephone Encounter (Signed)
Message sent to provider 

## 2018-09-10 ENCOUNTER — Other Ambulatory Visit: Payer: Self-pay | Admitting: Pulmonary Disease

## 2018-09-10 MED FILL — $VENTOLIN HFA 18G INHALER: 108 (90 BAS | 25 days supply | Qty: 18 | Fill #0

## 2018-09-10 MED FILL — $TRELEGY ELLIPTA 100-62.5-2: 100-62.5-25 | 90 days supply | Qty: 180 | Fill #1

## 2018-09-10 MED FILL — ALBUTEROL SUL 2.5 MG/3 ML S: (2.5 MG/3ML | 10 days supply | Qty: 90 | Fill #0

## 2018-10-09 ENCOUNTER — Encounter: Payer: Self-pay | Admitting: Pulmonary Disease

## 2018-10-09 ENCOUNTER — Other Ambulatory Visit: Payer: Self-pay

## 2018-10-09 ENCOUNTER — Ambulatory Visit (INDEPENDENT_AMBULATORY_CARE_PROVIDER_SITE_OTHER): Payer: Self-pay | Admitting: Pulmonary Disease

## 2018-10-09 VITALS — BP 128/72 | HR 87 | Temp 97.9°F | Ht 70.0 in | Wt 201.8 lb

## 2018-10-09 DIAGNOSIS — J45909 Unspecified asthma, uncomplicated: Secondary | ICD-10-CM

## 2018-10-09 DIAGNOSIS — J4530 Mild persistent asthma, uncomplicated: Secondary | ICD-10-CM

## 2018-10-09 DIAGNOSIS — J449 Chronic obstructive pulmonary disease, unspecified: Secondary | ICD-10-CM

## 2018-10-09 DIAGNOSIS — F17210 Nicotine dependence, cigarettes, uncomplicated: Secondary | ICD-10-CM

## 2018-10-09 DIAGNOSIS — K2100 Gastro-esophageal reflux disease with esophagitis, without bleeding: Secondary | ICD-10-CM

## 2018-10-09 LAB — CBC WITH DIFFERENTIAL/PLATELET
Basophils Absolute: 0.1 10*3/uL (ref 0.0–0.1)
Basophils Relative: 1.2 % (ref 0.0–3.0)
Eosinophils Absolute: 0.1 10*3/uL (ref 0.0–0.7)
Eosinophils Relative: 1.5 % (ref 0.0–5.0)
HCT: 44.3 % (ref 39.0–52.0)
Hemoglobin: 14.7 g/dL (ref 13.0–17.0)
Lymphocytes Relative: 32.1 % (ref 12.0–46.0)
Lymphs Abs: 1.8 10*3/uL (ref 0.7–4.0)
MCHC: 33.1 g/dL (ref 30.0–36.0)
MCV: 92.2 fl (ref 78.0–100.0)
Monocytes Absolute: 0.5 10*3/uL (ref 0.1–1.0)
Monocytes Relative: 8.3 % (ref 3.0–12.0)
Neutro Abs: 3.2 10*3/uL (ref 1.4–7.7)
Neutrophils Relative %: 56.9 % (ref 43.0–77.0)
Platelets: 194 10*3/uL (ref 150.0–400.0)
RBC: 4.81 Mil/uL (ref 4.22–5.81)
RDW: 14.1 % (ref 11.5–15.5)
WBC: 5.5 10*3/uL (ref 4.0–10.5)

## 2018-10-09 MED ORDER — ALBUTEROL SULFATE HFA 108 (90 BASE) MCG/ACT IN AERS: 2.0000 | INHALATION_SPRAY | RESPIRATORY_TRACT | 11 refills | Status: DC | PRN

## 2018-10-09 MED ORDER — MONTELUKAST SODIUM 10 MG PO TABS: 10.0000 mg | ORAL_TABLET | Freq: Every day | ORAL | 6 refills | Status: DC

## 2018-10-09 MED ORDER — OMEPRAZOLE 20 MG PO CPDR: 20.0000 mg | DELAYED_RELEASE_CAPSULE | Freq: Every day | ORAL | 6 refills | Status: DC

## 2018-10-09 MED FILL — $VENTOLIN HFA 18G INHALER: 108 (90 BAS | 25 days supply | Qty: 18 | Fill #0

## 2018-10-09 MED FILL — MONTELUKAST SOD 10 MG TAB: 10 | 30 days supply | Qty: 30 | Fill #0

## 2018-10-09 MED FILL — !CHANTIX CONT MONTH BOX: 1 | 28 days supply | Qty: 56 | Fill #0

## 2018-10-09 NOTE — Patient Instructions (Signed)
Glad you are feeling better Continue using the Trelegy and the Proventil We will renew your inhalers and also the Prilosec Please check with your primary care about renewal of BuSpar and Flexeril  Continue to work on smoking cessation We will call in the Chantix orders into the pharmacy  We will get some baseline labs today including CBC differential, IgE, alpha-1 antitrypsin levels and phenotype Follow-up in 6 months.

## 2018-10-09 NOTE — Addendum Note (Signed)
Addended by: Elton Sin on: 10/09/2018 10:04 AM   Modules accepted: Orders

## 2018-10-09 NOTE — Addendum Note (Signed)
Addended by: Elton Sin on: 10/09/2018 11:13 AM   Modules accepted: Orders

## 2018-10-09 NOTE — Addendum Note (Signed)
Addended by: Suzzanne Cloud E on: 10/09/2018 10:06 AM   Modules accepted: Orders

## 2018-10-09 NOTE — Progress Notes (Signed)
Aaron [name]    [id number]    Jul 25, 1970  Primary Care Physician:Stroud, Ellie Lunch, [name]  Referring Physician: Azzie Glatter, [place], [place] [address]  Chief complaint:   Follow-up for COPD  HPI: 48 year old with history of vaping THC, asthma, COPD.    History of intubation for asthma/COPD exacerbation, ARDS in February 2019. Admitted with repeat exacerbation on 02/16/2018 with acute exacerbation, dyspnea, wheezing.  This time he turned around quickly with prednisone, nebulizers and was discharged in 1 day. 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  Also smokes cannabis and vapes THC.  His last vaping was on day of admission on 02/16/2018 Travel history: No significant travel history Relevant family history: No significant history of lung disease.   Interim history:  Stable respiratory symptoms.  Continues on Trelegy inhaler He has albuterol which he uses a couple of times every day  Continues to smoke.  States that a pack lasts him about 3 days  Outpatient Encounter Medications as of 10/09/2018  Medication Sig  . albuterol (PROVENTIL HFA;VENTOLIN HFA) 108 (90 Base) MCG/ACT inhaler Inhale 2 puffs into the lungs every 4 (four) hours as needed for wheezing or shortness of breath.  Marland Kitchen albuterol (PROVENTIL) (2.5 MG/3ML) 0.083% nebulizer solution Take 3 mLs (2.5 mg total) by nebulization 3 (three) times daily.  . busPIRone (BUSPAR) 10 MG tablet Take 1 tablet (10 mg total) by mouth 2 (two) times daily.  . cetirizine (ZYRTEC) 10 MG tablet Take 1 tablet (10 mg total) by mouth daily.  . cholecalciferol (VITAMIN D) 1000 units tablet Take 1,000 Units by mouth daily.  . cyclobenzaprine (FLEXERIL) 10 MG tablet Take 1 tablet  (10 mg total) by mouth 2 (two) times daily as needed for muscle spasms.  . Fluticasone-Umeclidin-Vilant (TRELEGY ELLIPTA) 100-62.5-25 MCG/INH AEPB Inhale 1 puff into the lungs daily.  . montelukast (SINGULAIR) 10 MG tablet Take 1 tablet (10 mg total) by mouth at bedtime.  Marland Kitchen omeprazole (PRILOSEC) 20 MG capsule Take 1 capsule (20 mg total) by mouth daily.  . predniSONE (DELTASONE) 10 MG tablet Take 1 tablet (10 mg total) by mouth daily with breakfast. 4 tabs x 3 days, 3 tabs x 3 days, 2 tabs x 3 days, 1 tab x 3 days then stop  . varenicline (CHANTIX CONTINUING MONTH PAK) 1 MG tablet Take 1 tablet (1 mg total) by mouth 2 (two) times daily. (Patient not taking: Reported on 10/09/2018)   No facility-administered encounter medications on file as of 10/09/2018.    Physical Exam: Blood pressure 128/72, pulse 87, temperature 97.9 F (36.6 C), temperature source Oral, height 5\' 10"  (1.778 m), weight 201 lb 12.8 oz (91.5 kg), SpO2 97 %. Gen:      No acute distress HEENT:  EOMI, sclera anicteric Neck:     No masses; no thyromegaly Lungs:    Clear to auscultation bilaterally; normal respiratory effort CV:         Regular rate and rhythm; no murmurs Abd:      + bowel sounds; soft, non-tender; no palpable masses, no distension Ext:    No edema; adequate peripheral perfusion Skin:      Warm and dry; no rash Neuro: alert and oriented x 3 Psych: normal mood and  affect  Data Reviewed: Imaging: Chest x-ray 02/16/2018- mild hyperinflation, clear lungs. Chest x-ray 07/09/2018-hyperinflation, mild atelectasis of the right lower lobe  PFTs: Spirometry 07/09/2018 FVC 2.7 [64%], FEV1 1.7 [40%], F/F 61 Severe obstruction  FENO 02/28/2018-23  Labs: CBC 09/20/2017-WBC 4.4, eos 7%, absolute eosinophil count 308.  Assessment:  COPD Continue Trelegy inhaler, albuterol as needed We will check some baseline labs including CBC with differential, IgE, alpha-1 antitrypsin levels and phenotype  Active smoker Discussed  smoking cessation.  We will call in Chantix.  Reassess at next visit Time spent counseling-5 minutes.  Plan/Recommendations: - Continue Trelegy - Check CBC differential, IgE, alpha-1 antitrypsin levels and phenotype - Smoking cessation with chantix   [name] [facility] 10/09/2018, 9:43 AM  CC: [name], [name], [name]

## 2018-10-13 LAB — IGE: IgE (Immunoglobulin E), Serum: 168 kU/L — ABNORMAL HIGH (ref <=114)

## 2018-10-13 LAB — ALPHA-1 ANTITRYPSIN PHENOTYPE: A-1 Antitrypsin, Ser: 125 mg/dL (ref 83–199)

## 2018-10-19 MED FILL — !CHANTIX CONT MONTH BOX: 1 | 28 days supply | Qty: 56 | Fill #0

## 2018-10-19 MED FILL — $VENTOLIN HFA 18G INHALER: 108 (90 BAS | 25 days supply | Qty: 18 | Fill #0

## 2018-10-19 MED FILL — MONTELUKAST SOD 10 MG TAB: 10 | 30 days supply | Qty: 30 | Fill #0

## 2018-10-19 MED FILL — OMEPRAZOLE 20 MG CAP: 20 | 30 days supply | Qty: 30 | Fill #0

## 2018-10-29 ENCOUNTER — Ambulatory Visit (INDEPENDENT_AMBULATORY_CARE_PROVIDER_SITE_OTHER): Payer: Self-pay | Admitting: Family Medicine

## 2018-10-29 ENCOUNTER — Encounter: Payer: Self-pay | Admitting: Family Medicine

## 2018-10-29 ENCOUNTER — Other Ambulatory Visit: Payer: Self-pay

## 2018-10-29 VITALS — BP 128/88 | HR 96 | Temp 98.4°F | Ht 70.0 in | Wt 198.0 lb

## 2018-10-29 DIAGNOSIS — Z09 Encounter for follow-up examination after completed treatment for conditions other than malignant neoplasm: Secondary | ICD-10-CM

## 2018-10-29 DIAGNOSIS — Z716 Tobacco abuse counseling: Secondary | ICD-10-CM

## 2018-10-29 DIAGNOSIS — F4320 Adjustment disorder, unspecified: Secondary | ICD-10-CM | POA: Insufficient documentation

## 2018-10-29 DIAGNOSIS — R0602 Shortness of breath: Secondary | ICD-10-CM | POA: Insufficient documentation

## 2018-10-29 DIAGNOSIS — F419 Anxiety disorder, unspecified: Secondary | ICD-10-CM

## 2018-10-29 DIAGNOSIS — Z720 Tobacco use: Secondary | ICD-10-CM

## 2018-10-29 DIAGNOSIS — J4530 Mild persistent asthma, uncomplicated: Secondary | ICD-10-CM | POA: Insufficient documentation

## 2018-10-29 MED ORDER — BUSPIRONE HCL 15 MG PO TABS: 15.0000 mg | ORAL_TABLET | Freq: Two times a day (BID) | ORAL | 3 refills | Status: DC

## 2018-10-29 MED FILL — busPIRone HCL 15 MG TABS: 15 | 30 days supply | Qty: 60 | Fill #0
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
## 2018-10-29 NOTE — Patient Instructions (Signed)
Generalized Anxiety Disorder, Adult Generalized anxiety disorder (GAD) is a mental health disorder. People with this condition constantly worry about everyday events. Unlike normal anxiety, worry related to GAD is not triggered by a specific event. These worries also do not fade or get better with time. GAD interferes with life functions, including relationships, work, and school. GAD can vary from mild to severe. People with severe GAD can have intense waves of anxiety with physical symptoms (panic attacks). What are the causes? The exact cause of GAD is not known. What increases the risk? This condition is more likely to develop in:  Women.  People who have a family history of anxiety disorders.  People who are very shy.  People who experience very stressful life events, such as the death of a loved one.  People who have a very stressful family environment. What are the signs or symptoms? People with GAD often worry excessively about many things in their lives, such as their health and family. They may also be overly concerned about:  Doing well at work.  Being on time.  Natural disasters.  Friendships. Physical symptoms of GAD include:  Fatigue.  Muscle tension or having muscle twitches.  Trembling or feeling shaky.  Being easily startled.  Feeling like your heart is pounding or racing.  Feeling out of breath or like you cannot take a deep breath.  Having trouble falling asleep or staying asleep.  Sweating.  Nausea, diarrhea, or irritable bowel syndrome (IBS).  Headaches.  Trouble concentrating or remembering facts.  Restlessness.  Irritability. How is this diagnosed? Your health care provider can diagnose GAD based on your symptoms and medical history. You will also have a physical exam. The health care provider will ask specific questions about your symptoms, including how severe they are, when they started, and if they come and go. Your health care  provider may ask you about your use of alcohol or drugs, including prescription medicines. Your health care provider may refer you to a mental health specialist for further evaluation. Your health care provider will do a thorough examination and may perform additional tests to rule out other possible causes of your symptoms. To be diagnosed with GAD, a person must have anxiety that:  Is out of his or her control.  Affects several different aspects of his or her life, such as work and relationships.  Causes distress that makes him or her unable to take part in normal activities.  Includes at least three physical symptoms of GAD, such as restlessness, fatigue, trouble concentrating, irritability, muscle tension, or sleep problems. Before your health care provider can confirm a diagnosis of GAD, these symptoms must be present more days than they are not, and they must last for six months or longer. How is this treated? The following therapies are usually used to treat GAD:  Medicine. Antidepressant medicine is usually prescribed for long-term daily control. Antianxiety medicines may be added in severe cases, especially when panic attacks occur.  Talk therapy (psychotherapy). Certain types of talk therapy can be helpful in treating GAD by providing support, education, and guidance. Options include: ? Cognitive behavioral therapy (CBT). People learn coping skills and techniques to ease their anxiety. They learn to identify unrealistic or negative thoughts and behaviors and to replace them with positive ones. ? Acceptance and commitment therapy (ACT). This treatment teaches people how to be mindful as a way to cope with unwanted thoughts and feelings. ? Biofeedback. This process trains you to manage your body's response (  physiological response) through breathing techniques and relaxation methods. You will work with a therapist while machines are used to monitor your physical symptoms.  Stress  management techniques. These include yoga, meditation, and exercise. A mental health specialist can help determine which treatment is best for you. Some people see improvement with one type of therapy. However, other people require a combination of therapies. Follow these instructions at home:  Take over-the-counter and prescription medicines only as told by your health care provider.  Try to maintain a normal routine.  Try to anticipate stressful situations and allow extra time to manage them.  Practice any stress management or self-calming techniques as taught by your health care provider.  Do not punish yourself for setbacks or for not making progress.  Try to recognize your accomplishments, even if they are small.  Keep all follow-up visits as told by your health care provider. This is important. Contact a health care provider if:  Your symptoms do not get better.  Your symptoms get worse.  You have signs of depression, such as: ? A persistently sad, cranky, or irritable mood. ? Loss of enjoyment in activities that used to bring you joy. ? Change in weight or eating. ? Changes in sleeping habits. ? Avoiding friends or family members. ? Loss of energy for normal tasks. ? Feelings of guilt or worthlessness. Get help right away if:  You have serious thoughts about hurting yourself or others. If you ever feel like you may hurt yourself or others, or have thoughts about taking your own life, get help right away. You can go to your nearest emergency department or call:  Your local emergency services (911 in the [place]  A suicide crisis helpline, such as the National Suicide Prevention Lifeline at 1-800-273-8255. This is open 24 hours a day. Summary  Generalized anxiety disorder (GAD) is a mental health disorder that involves worry that is not triggered by a specific event.  People with GAD often worry excessively about many things in their lives, such as their health and  family.  GAD may cause physical symptoms such as restlessness, trouble concentrating, sleep problems, frequent sweating, nausea, diarrhea, headaches, and trembling or muscle twitching.  A mental health specialist can help determine which treatment is best for you. Some people see improvement with one type of therapy. However, other people require a combination of therapies. This information is not intended to replace advice given to you by your health care provider. Make sure you discuss any questions you have with your health care provider. Document Released: 08/13/2012 Document Revised: 03/31/2017 Document Reviewed: 03/08/2016 Elsevier Patient Education  2020 [employer] Buspirone tablets What is this medicine? BUSPIRONE (byoo SPYE rone) is used to treat anxiety disorders. This medicine may be used for other purposes; ask your health care provider or pharmacist if you have questions. COMMON BRAND NAME(S): BuSpar What should I tell my health care provider before I take this medicine? They need to know if you have any of these conditions:  kidney or liver disease  an unusual or allergic reaction to buspirone, other medicines, foods, dyes, or preservatives  pregnant or trying to get pregnant  breast-feeding How should I use this medicine? Take this medicine by mouth with a glass of water. Follow the directions on the prescription label. You may take this medicine with or without food. To ensure that this medicine always works the same way for you, you should take it either always with or always without food. Take your doses   at regular intervals. Do not take your medicine more often than directed. Do not stop taking except on the advice of your doctor or health care professional. Talk to your pediatrician regarding the use of this medicine in children. Special care may be needed. Overdosage: If you think you have taken too much of this medicine contact a poison control center or emergency  room at once. NOTE: This medicine is only for you. Do not share this medicine with others. What if I miss a dose? If you miss a dose, take it as soon as you can. If it is almost time for your next dose, take only that dose. Do not take double or extra doses. What may interact with this medicine? Do not take this medicine with any of the following medications:  linezolid  MAOIs like Carbex, Eldepryl, Marplan, Nardil, and Parnate  methylene blue  procarbazine This medicine may also interact with the following medications:  diazepam  digoxin  diltiazem  erythromycin  grapefruit juice  haloperidol  medicines for mental depression or mood problems  medicines for seizures like carbamazepine, phenobarbital and phenytoin  nefazodone  other medications for anxiety  rifampin  ritonavir  some antifungal medicines like itraconazole, ketoconazole, and voriconazole  verapamil  warfarin This list may not describe all possible interactions. Give your health care provider a list of all the medicines, herbs, non-prescription drugs, or dietary supplements you use. Also tell them if you smoke, drink alcohol, or use illegal drugs. Some items may interact with your medicine. What should I watch for while using this medicine? Visit your doctor or health care professional for regular checks on your progress. It may take 1 to 2 weeks before your anxiety gets better. You may get drowsy or dizzy. Do not drive, use machinery, or do anything that needs mental alertness until you know how this drug affects you. Do not stand or sit up quickly, especially if you are an older patient. This reduces the risk of dizzy or fainting spells. Alcohol can make you more drowsy and dizzy. Avoid alcoholic drinks. What side effects may I notice from receiving this medicine? Side effects that you should report to your doctor or health care professional as soon as possible:  blurred vision or other vision  changes  chest pain  confusion  difficulty breathing  feelings of hostility or anger  muscle aches and pains  numbness or tingling in hands or feet  ringing in the ears  skin rash and itching  vomiting  weakness Side effects that usually do not require medical attention (report to your doctor or health care professional if they continue or are bothersome):  disturbed dreams, nightmares  headache  nausea  restlessness or nervousness  sore throat and nasal congestion  stomach upset This list may not describe all possible side effects. Call your doctor for medical advice about side effects. You may report side effects to FDA at 1-800-FDA-1088. Where should I keep my medicine? Keep out of the reach of children. Store at room temperature below 30 degrees C (86 degrees F). Protect from light. Keep container tightly closed. Throw away any unused medicine after the expiration date. NOTE: This sheet is a summary. It may not cover all possible information. If you have questions about this medicine, talk to your doctor, pharmacist, or health care provider.  2020 Elsevier/Gold Standard (2009-11-26 18:06:11)  

## 2018-10-31 DIAGNOSIS — F4321 Adjustment disorder with depressed mood: Secondary | ICD-10-CM

## 2018-10-31 HISTORY — DX: Adjustment disorder with depressed mood: F43.21

## 2018-12-06 MED FILL — $VENTOLIN HFA 18G INHALER: 108 (90 BAS | 49 days supply | Qty: 36 | Fill #1

## 2019-01-29 ENCOUNTER — Encounter: Payer: Self-pay | Admitting: Family Medicine

## 2019-01-29 ENCOUNTER — Other Ambulatory Visit: Payer: Self-pay

## 2019-01-29 ENCOUNTER — Ambulatory Visit (INDEPENDENT_AMBULATORY_CARE_PROVIDER_SITE_OTHER): Payer: Self-pay | Admitting: Family Medicine

## 2019-01-29 VITALS — BP 126/76 | HR 86 | Temp 98.2°F | Ht 70.0 in | Wt 192.0 lb

## 2019-01-29 DIAGNOSIS — F4320 Adjustment disorder, unspecified: Secondary | ICD-10-CM

## 2019-01-29 DIAGNOSIS — Z720 Tobacco use: Secondary | ICD-10-CM

## 2019-01-29 DIAGNOSIS — R0602 Shortness of breath: Secondary | ICD-10-CM

## 2019-01-29 DIAGNOSIS — J45909 Unspecified asthma, uncomplicated: Secondary | ICD-10-CM

## 2019-01-29 DIAGNOSIS — F419 Anxiety disorder, unspecified: Secondary | ICD-10-CM

## 2019-01-29 DIAGNOSIS — K2100 Gastro-esophageal reflux disease with esophagitis, without bleeding: Secondary | ICD-10-CM

## 2019-01-29 DIAGNOSIS — J4489 Other specified chronic obstructive pulmonary disease: Secondary | ICD-10-CM

## 2019-01-29 DIAGNOSIS — Z23 Encounter for immunization: Secondary | ICD-10-CM

## 2019-01-29 DIAGNOSIS — Z09 Encounter for follow-up examination after completed treatment for conditions other than malignant neoplasm: Secondary | ICD-10-CM

## 2019-01-29 DIAGNOSIS — J449 Chronic obstructive pulmonary disease, unspecified: Secondary | ICD-10-CM

## 2019-01-29 MED ORDER — MONTELUKAST SODIUM 10 MG PO TABS: 10.0000 mg | ORAL_TABLET | Freq: Every day | ORAL | 6 refills | Status: DC

## 2019-01-29 MED ORDER — OMEPRAZOLE 20 MG PO CPDR: 20.0000 mg | DELAYED_RELEASE_CAPSULE | Freq: Every day | ORAL | 6 refills | Status: DC
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
## 2019-01-30 ENCOUNTER — Encounter: Payer: Self-pay | Admitting: Family Medicine

## 2019-01-31 MED FILL — $VENTOLIN HFA 18G INHALER: 108 (90 BAS | 49 days supply | Qty: 36 | Fill #2

## 2019-02-07 IMAGING — CR DG CHEST 2V
2 series · 2 of 2 positions shown · non-contrast
Comparison: February 23, 2017

CLINICAL DATA: Asthma attack.  Shortness of breath.

EXAM:
CHEST  2 VIEW

[w chest pa]
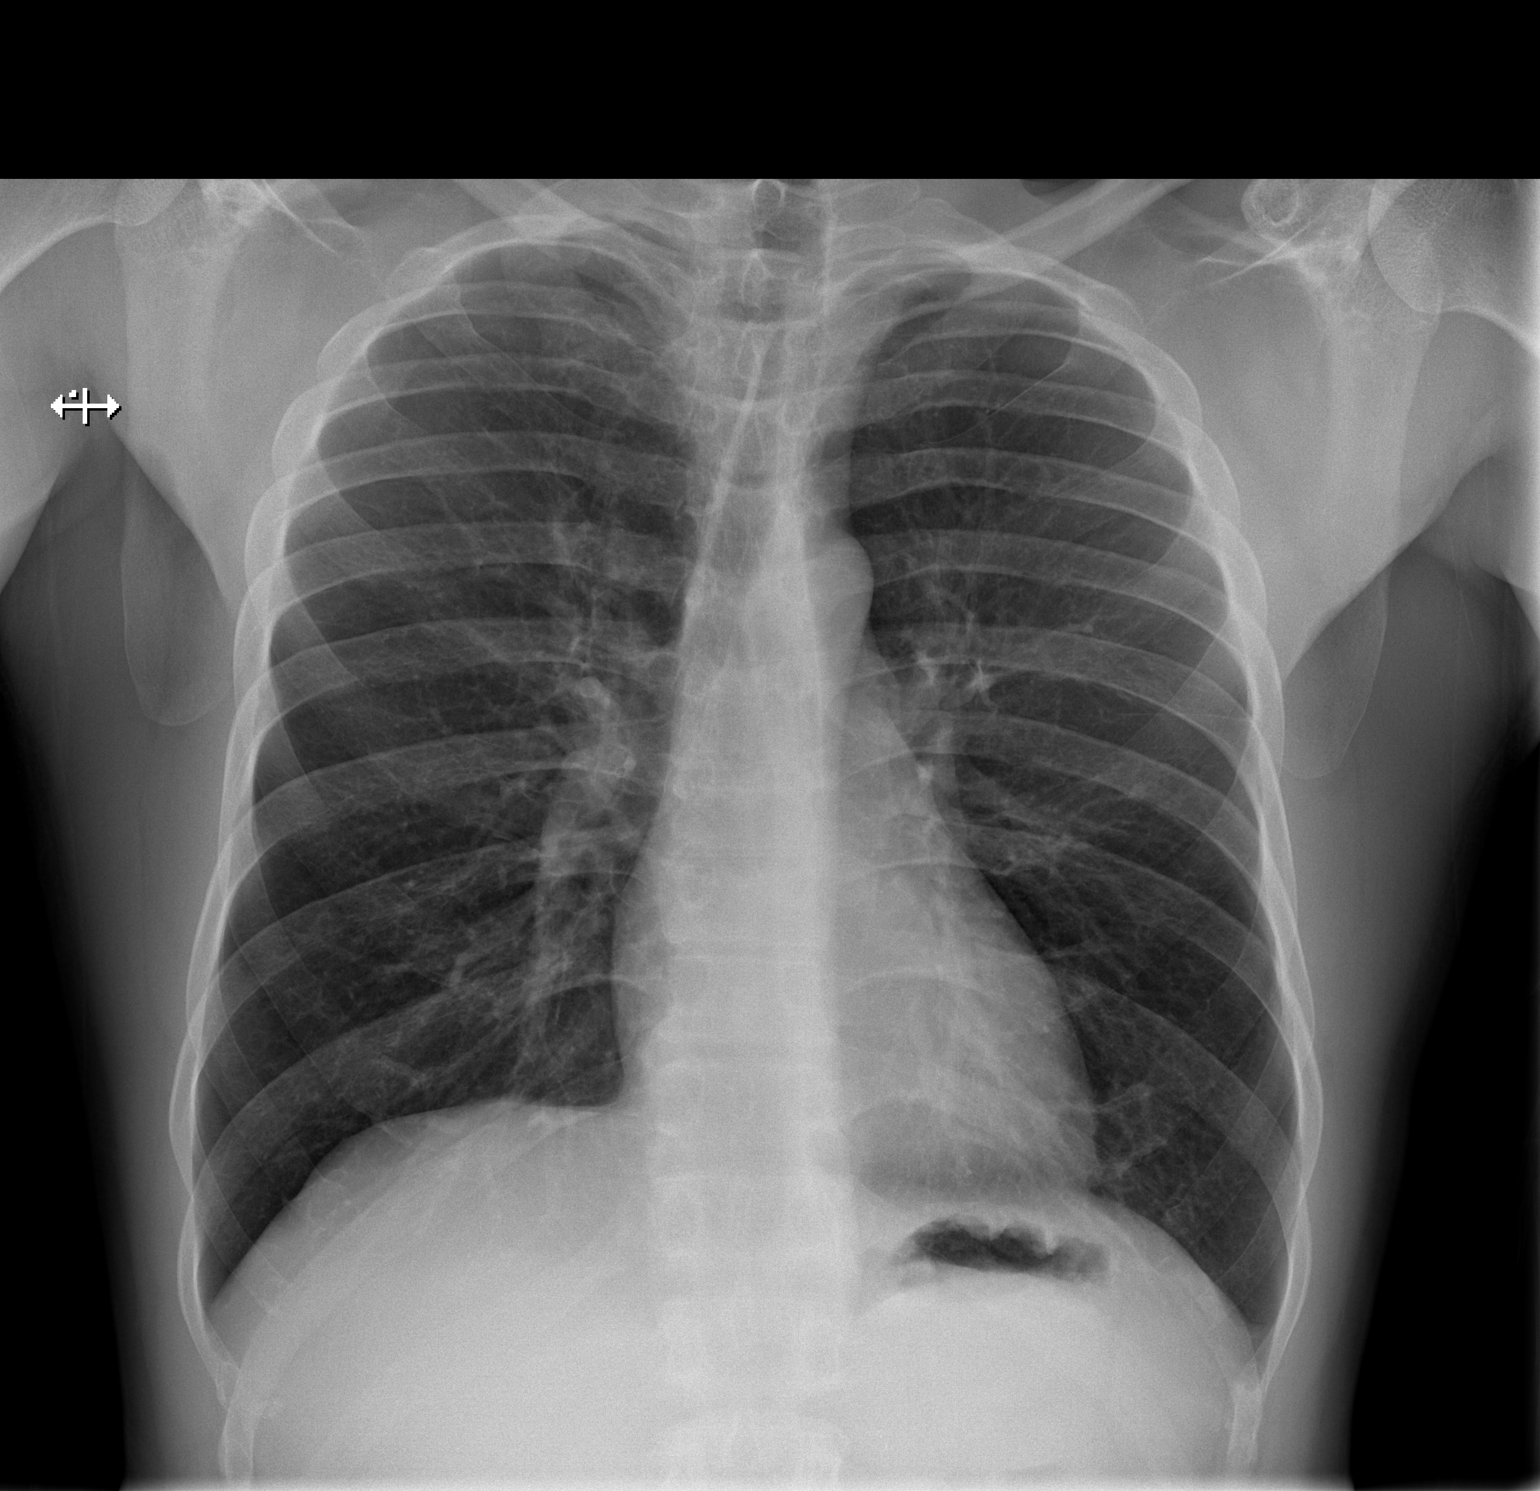

[w chest lat]
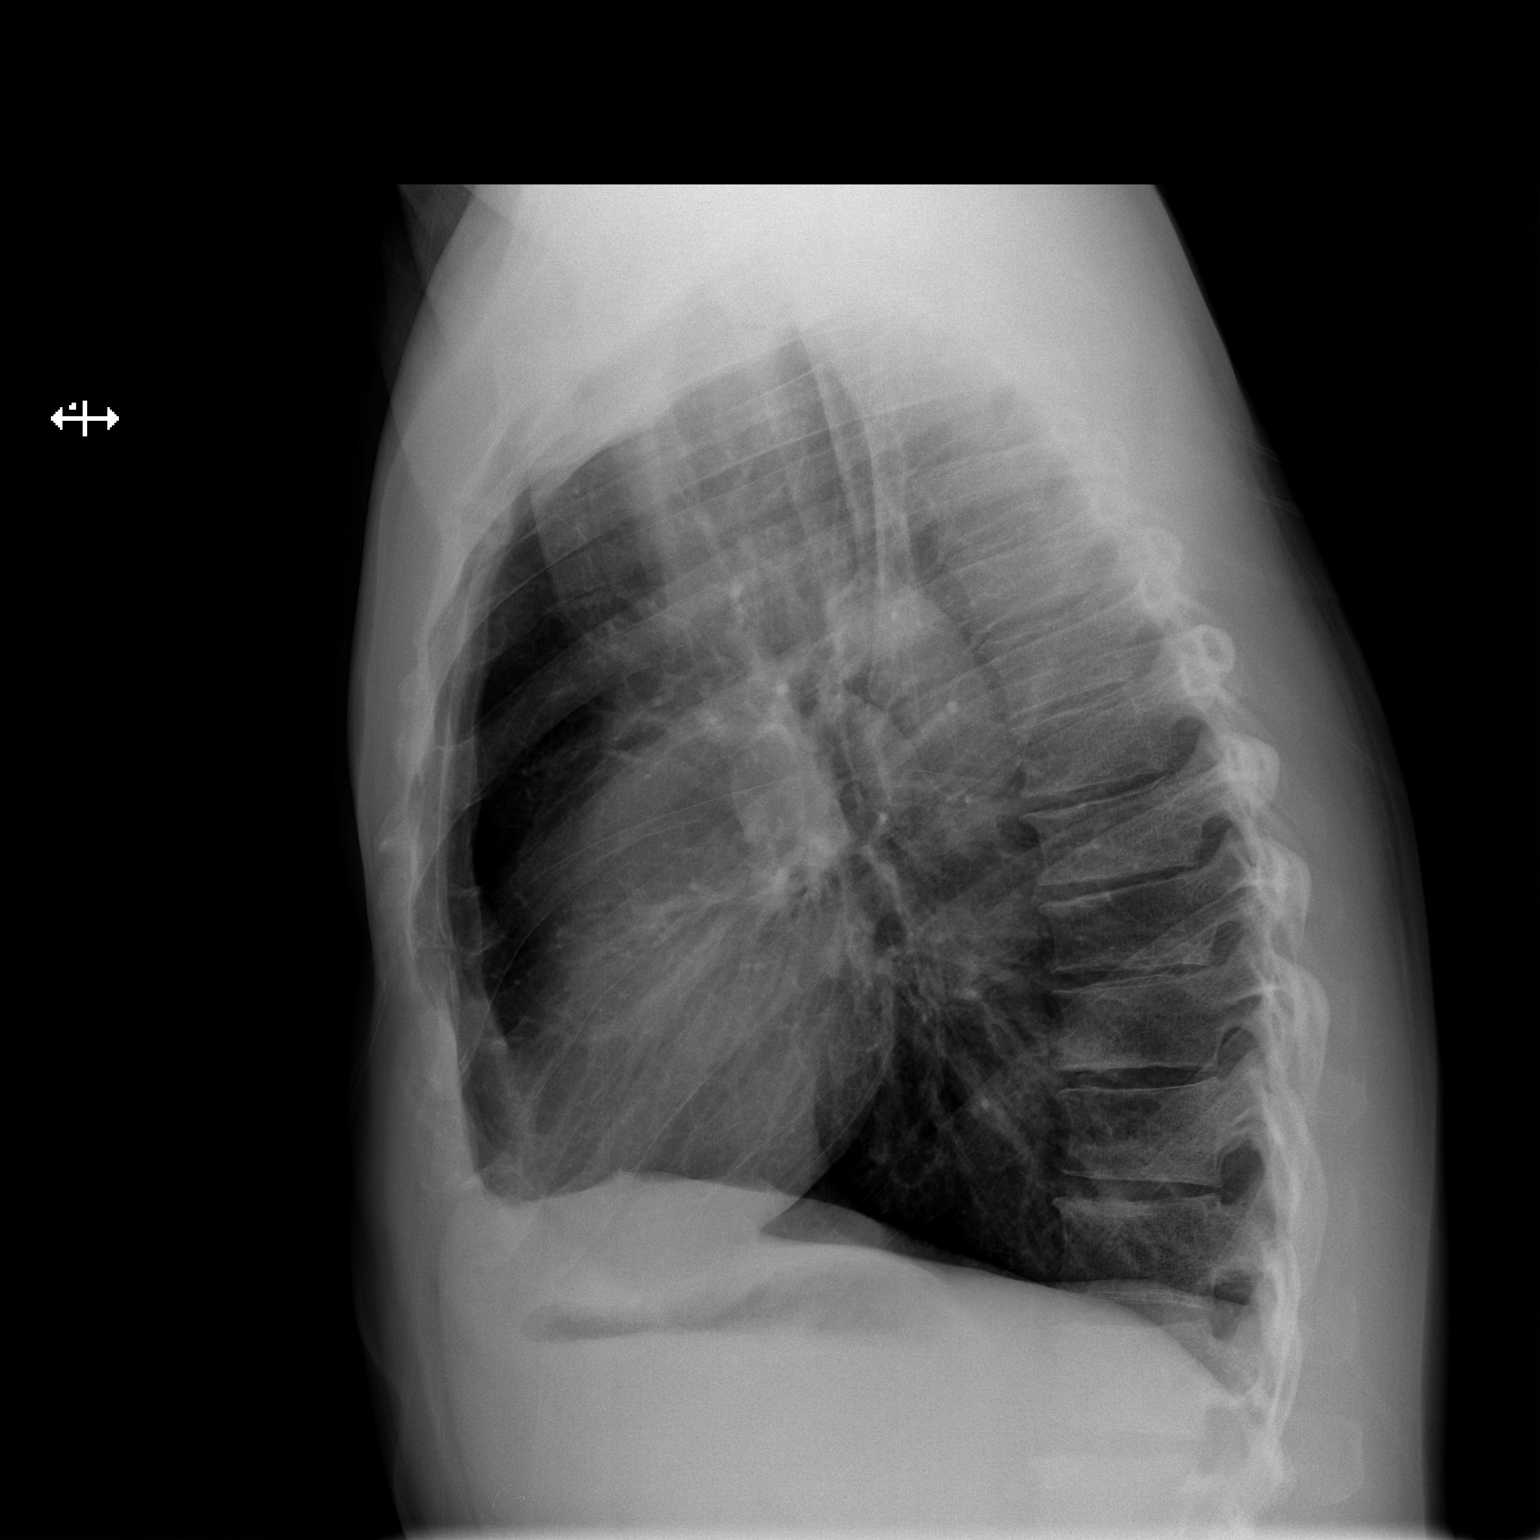

[2 of 2 positions shown; findings below may reference images not displayed]

FINDINGS: The heart size and mediastinal contours are within normal limits.
Both lungs are clear. The visualized skeletal structures are
unremarkable.
IMPRESSION: No active cardiopulmonary disease.

## 2019-02-08 IMAGING — DX DG ABDOMEN 1V
1 series · 1 of 1 positions shown · non-contrast
Comparison: None.

CLINICAL DATA: Orogastric tube placement.

EXAM:
ABDOMEN - 1 VIEW

[abdomen kub]
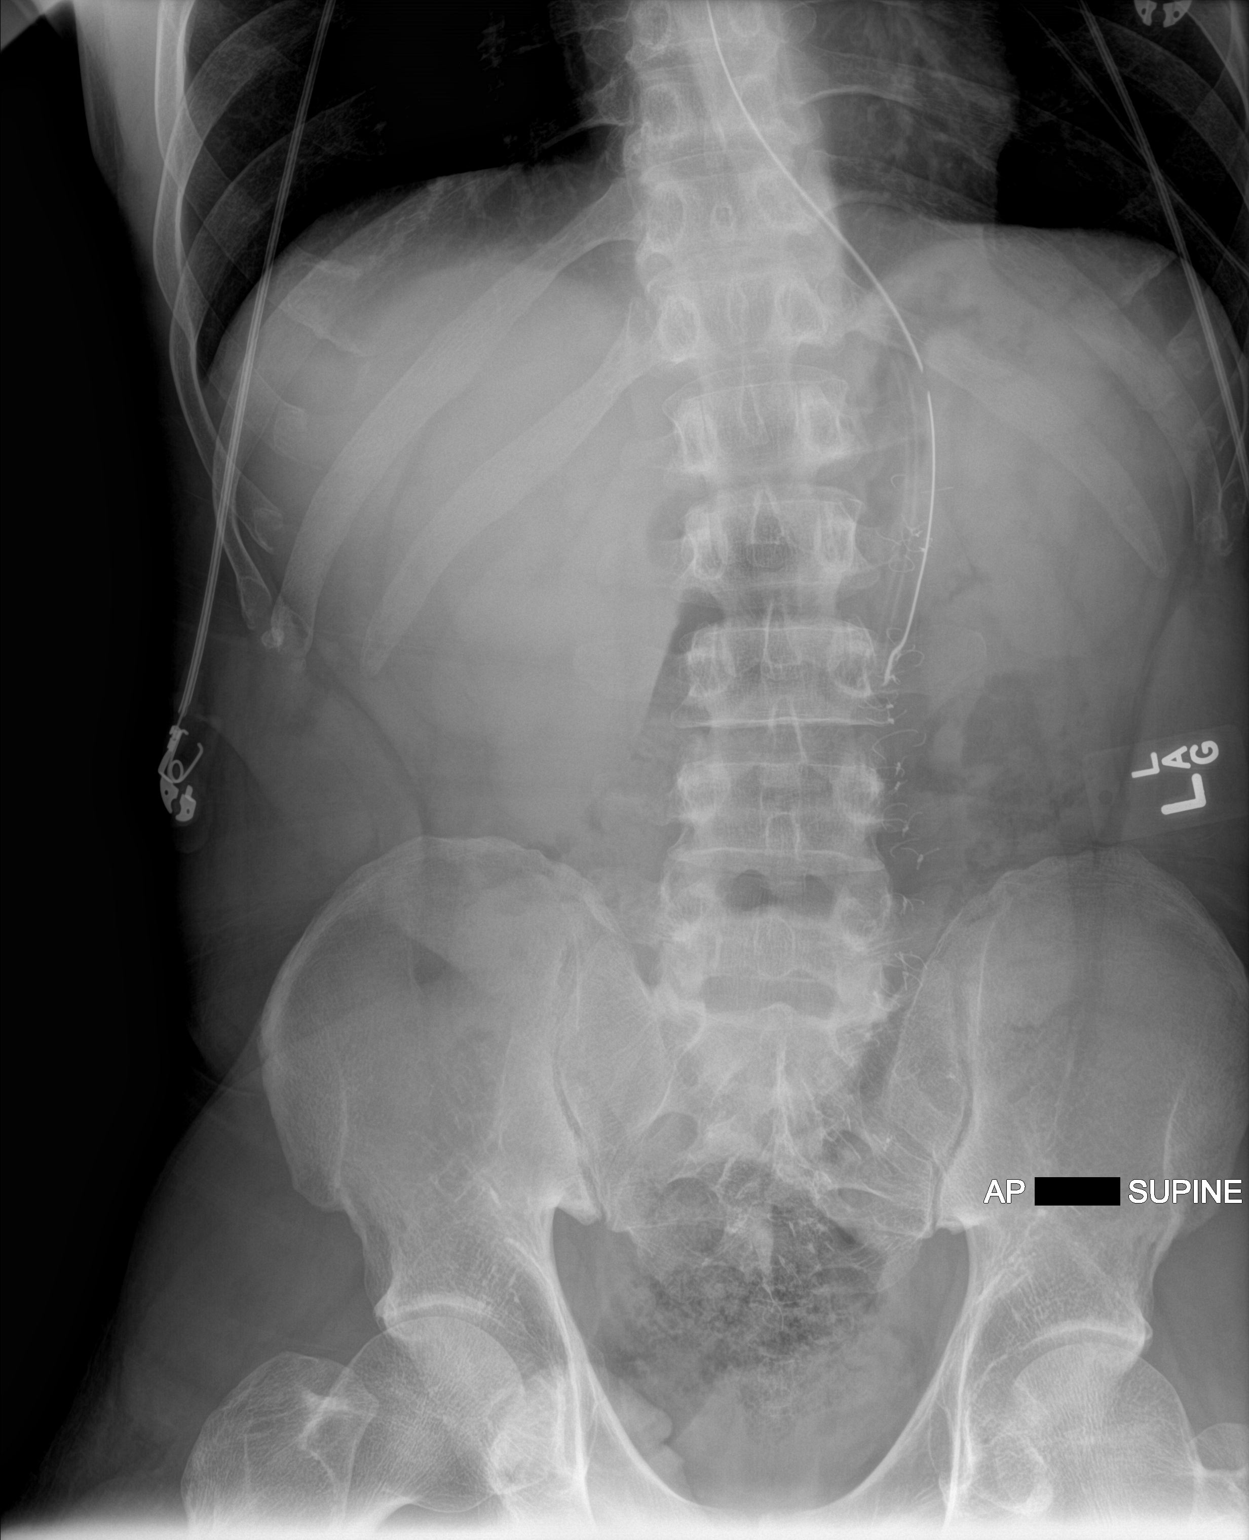

[1 of 1 positions shown; findings below may reference images not displayed]

FINDINGS: The patient's enteric tube is noted ending overlying the body of the
stomach, with the side port about the fundus of the stomach.

The visualized bowel gas pattern is grossly unremarkable, with a
small amount of stool noted in the colon. Postoperative change is
noted along the midline abdominal wall. No acute osseous
abnormalities are seen. The visualized lung bases are clear.
IMPRESSION: Enteric tube noted ending overlying the body of the stomach.

## 2019-02-08 IMAGING — DX DG CHEST 1V PORT
1 series · 1 of 1 positions shown · non-contrast
Comparison: Chest radiograph performed 06/05/2017

CLINICAL DATA: Endotracheal tube placement.

EXAM:
PORTABLE CHEST 1 VIEW

[chest ap]
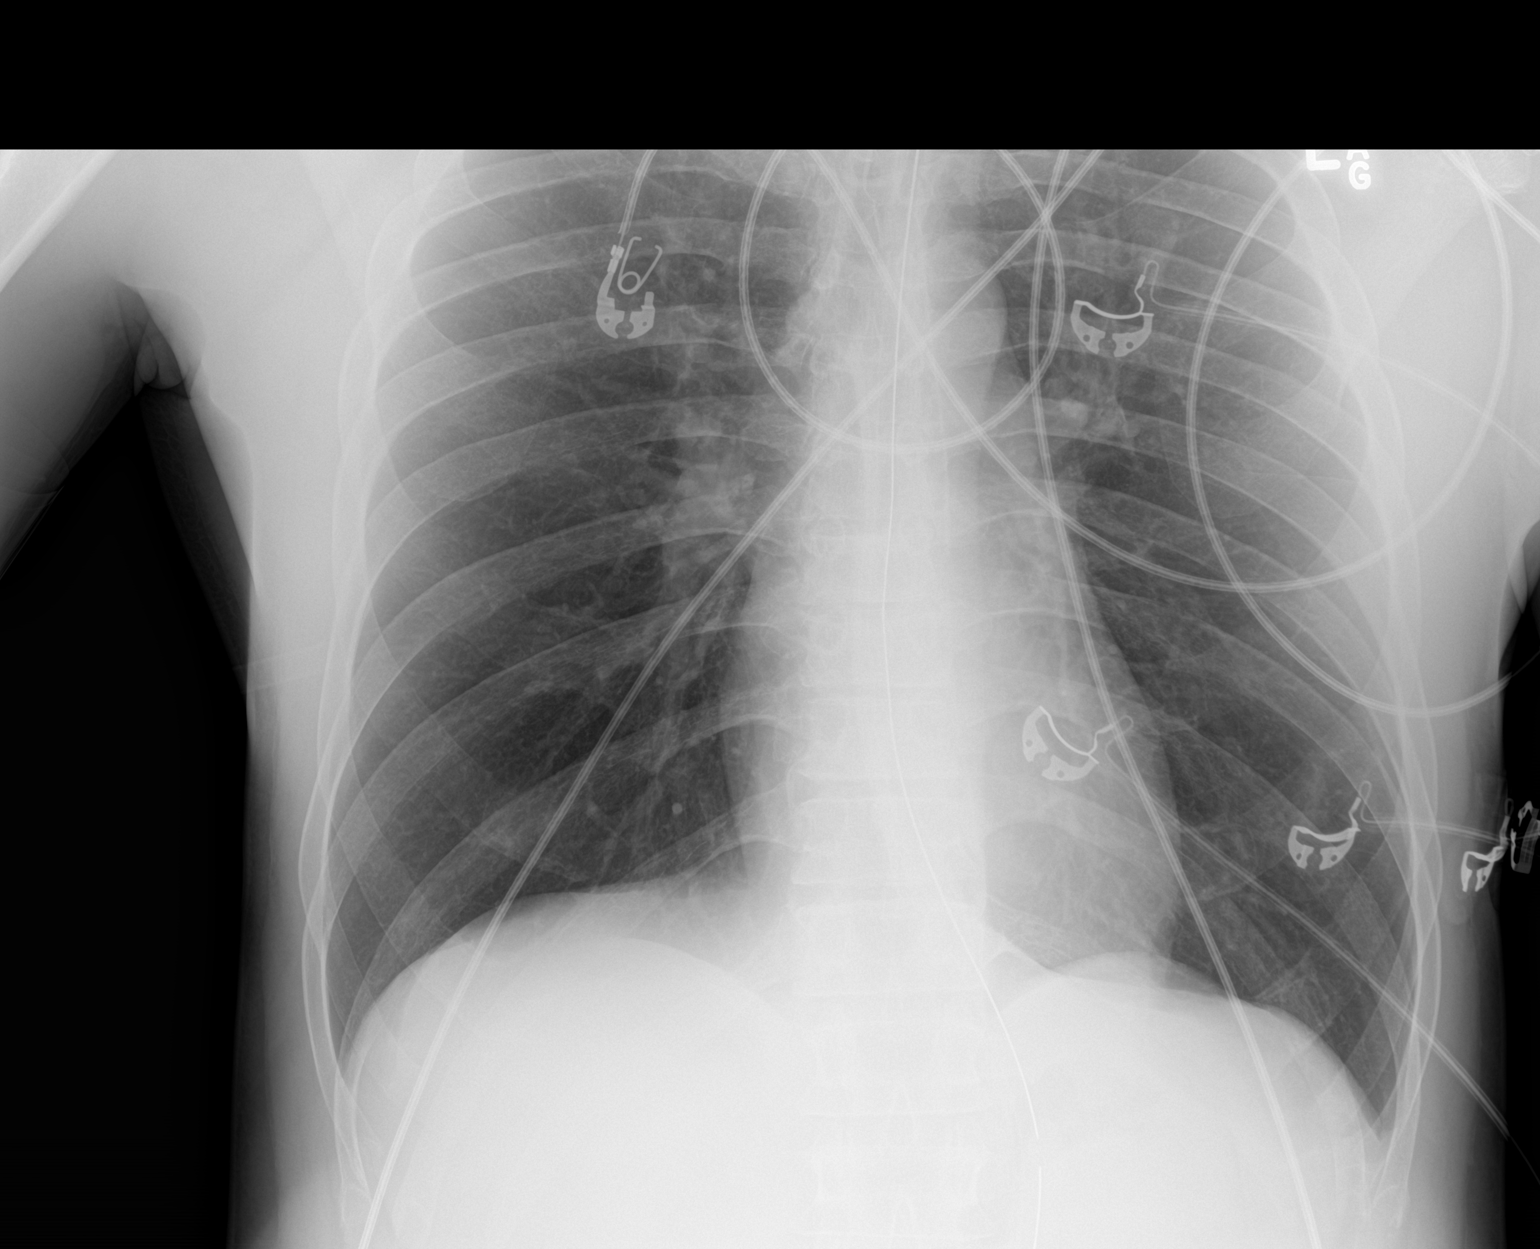

[1 of 1 positions shown; findings below may reference images not displayed]

FINDINGS: The patient's endotracheal tube is seen ending 3 cm above the
carina. An enteric tube is noted extending below the diaphragm.

The lungs are well-aerated and clear. There is no evidence of focal
opacification, pleural effusion or pneumothorax.

The cardiomediastinal silhouette is within normal limits. No acute
osseous abnormalities are seen.
IMPRESSION: 1. Endotracheal tube seen ending 3 cm above the carina.
2. Enteric tube seen extending below the diaphragm.
3. No acute cardiopulmonary process seen.

## 2019-02-10 IMAGING — DX DG CHEST 1V PORT
1 series · 1 of 1 positions shown · non-contrast
Comparison: Radiograph June 07, 2017.

CLINICAL DATA: Asthma.

EXAM:
PORTABLE CHEST 1 VIEW

[chest ap]
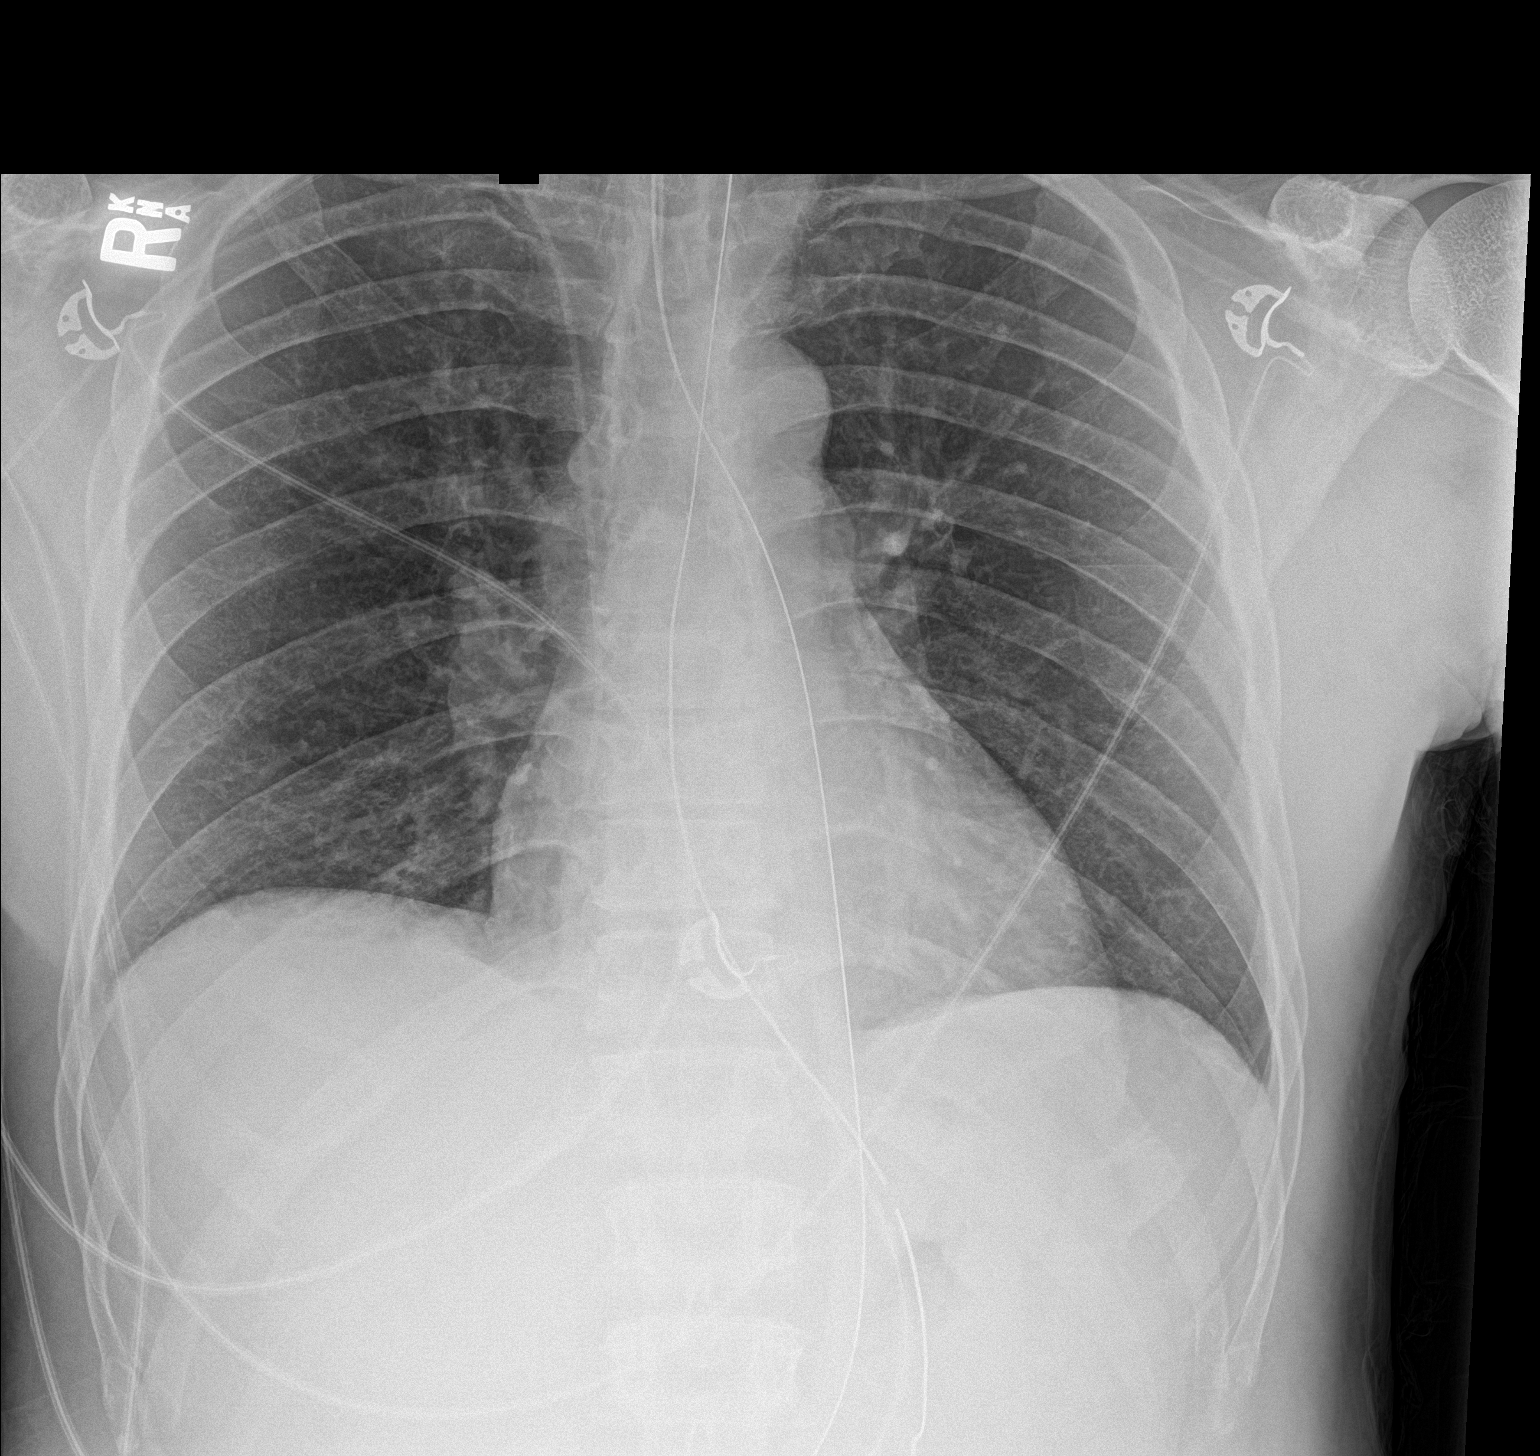

[1 of 1 positions shown; findings below may reference images not displayed]

FINDINGS: The heart size and mediastinal contours are within normal limits.
Both lungs are clear. Endotracheal and nasogastric tubes are
unchanged in position. Right-sided PICC line is unchanged in
position. No pneumothorax or pleural effusion is noted. The
visualized skeletal structures are unremarkable.
IMPRESSION: Stable support apparatus. No acute cardiopulmonary abnormality seen.

## 2019-02-11 IMAGING — DX DG CHEST 1V PORT
1 series · 1 of 1 positions shown · non-contrast
Comparison: 06/08/2017

CLINICAL DATA: Acute respiratory failure, hypoxia

EXAM:
PORTABLE CHEST 1 VIEW

[chest]
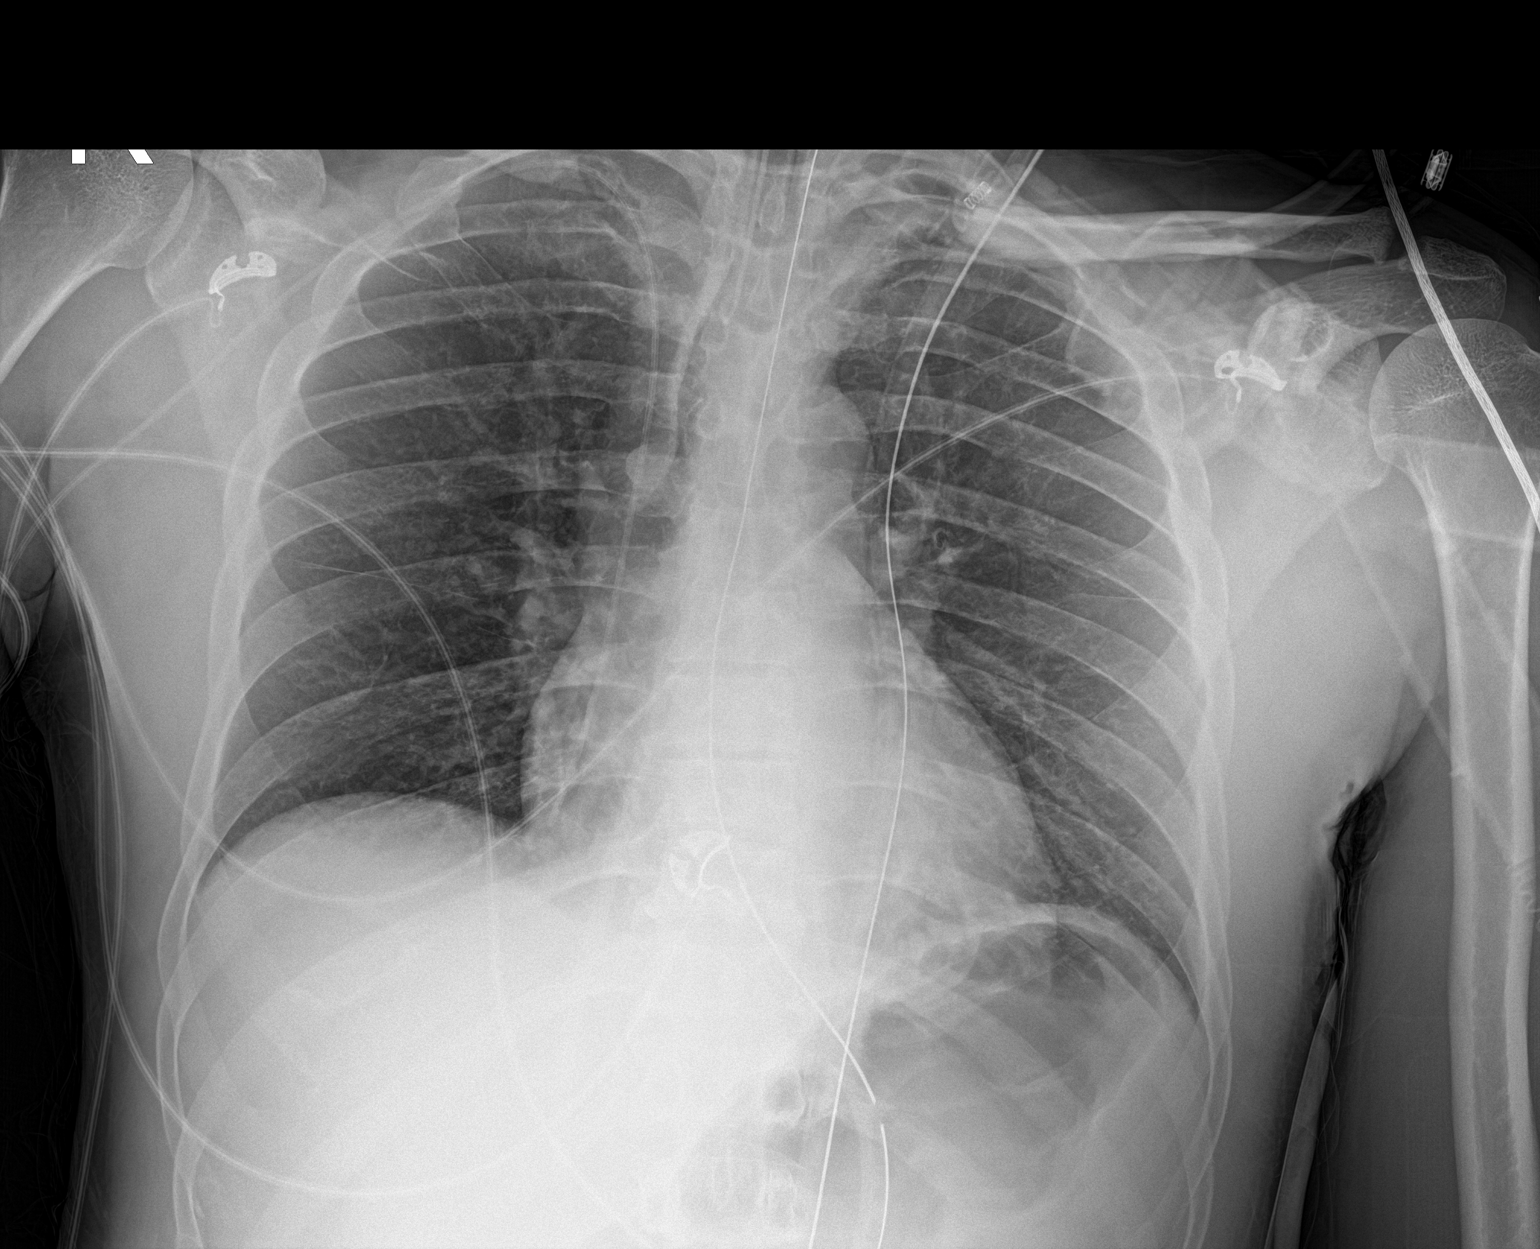

[1 of 1 positions shown; findings below may reference images not displayed]

FINDINGS: Right PICC line tip is in the lower right atrium approximately
cm below the IVC right atrial junction. Endotracheal tube and NG
tube are unchanged. Minimal left base atelectasis. Right lung clear.
Heart is normal size.
IMPRESSION: Left base atelectasis.

Right side PICC line tip in the lower right atrium 5.7 cm below the
IVC right atrial junction.

## 2019-03-26 MED FILL — $VENTOLIN HFA 18G INHALER: 108 (90 BAS | 25 days supply | Qty: 18 | Fill #3

## 2019-04-24 MED FILL — ALBUTEROL SULFATE HFA 108 (: 108 (90 BAS | 25 days supply | Qty: 18 | Fill #4

## 2019-04-30 ENCOUNTER — Ambulatory Visit: Payer: Self-pay | Admitting: Family Medicine

## 2019-05-03 DIAGNOSIS — E559 Vitamin D deficiency, unspecified: Secondary | ICD-10-CM

## 2019-05-03 HISTORY — DX: Vitamin D deficiency, unspecified: E55.9

## 2019-05-06 ENCOUNTER — Other Ambulatory Visit: Payer: Self-pay

## 2019-05-06 ENCOUNTER — Encounter: Payer: Self-pay | Admitting: Family Medicine

## 2019-05-06 ENCOUNTER — Ambulatory Visit (INDEPENDENT_AMBULATORY_CARE_PROVIDER_SITE_OTHER): Payer: Self-pay | Admitting: Family Medicine

## 2019-05-06 VITALS — BP 128/68 | HR 75 | Temp 97.9°F | Ht 70.0 in | Wt 191.0 lb

## 2019-05-06 DIAGNOSIS — J45909 Unspecified asthma, uncomplicated: Secondary | ICD-10-CM

## 2019-05-06 DIAGNOSIS — Z0000 Encounter for general adult medical examination without abnormal findings: Secondary | ICD-10-CM

## 2019-05-06 DIAGNOSIS — K2100 Gastro-esophageal reflux disease with esophagitis, without bleeding: Secondary | ICD-10-CM

## 2019-05-06 DIAGNOSIS — J449 Chronic obstructive pulmonary disease, unspecified: Secondary | ICD-10-CM

## 2019-05-06 DIAGNOSIS — Z09 Encounter for follow-up examination after completed treatment for conditions other than malignant neoplasm: Secondary | ICD-10-CM

## 2019-05-06 DIAGNOSIS — M25511 Pain in right shoulder: Secondary | ICD-10-CM

## 2019-05-06 DIAGNOSIS — J4489 Other specified chronic obstructive pulmonary disease: Secondary | ICD-10-CM

## 2019-05-06 DIAGNOSIS — M25512 Pain in left shoulder: Secondary | ICD-10-CM

## 2019-05-06 DIAGNOSIS — F419 Anxiety disorder, unspecified: Secondary | ICD-10-CM

## 2019-05-06 LAB — POCT URINALYSIS DIPSTICK
Bilirubin, UA: NEGATIVE
Blood, UA: NEGATIVE
Glucose, UA: NEGATIVE
Ketones, UA: NEGATIVE
Nitrite, UA: NEGATIVE
Protein, UA: NEGATIVE
Spec Grav, UA: 1.025 (ref 1.010–1.025)
Urobilinogen, UA: 0.2 E.U./dL
pH, UA: 7 (ref 5.0–8.0)

## 2019-05-06 MED ORDER — OMEPRAZOLE 20 MG PO CPDR: 20.0000 mg | DELAYED_RELEASE_CAPSULE | Freq: Every day | ORAL | 6 refills | Status: DC

## 2019-05-06 MED ORDER — BUSPIRONE HCL 15 MG PO TABS: 15.0000 mg | ORAL_TABLET | Freq: Two times a day (BID) | ORAL | 6 refills | Status: AC

## 2019-05-06 MED ORDER — CYCLOBENZAPRINE HCL 10 MG PO TABS: 10.0000 mg | ORAL_TABLET | Freq: Two times a day (BID) | ORAL | 6 refills | Status: DC | PRN

## 2019-05-06 MED ORDER — MONTELUKAST SODIUM 10 MG PO TABS: 10.0000 mg | ORAL_TABLET | Freq: Every day | ORAL | 6 refills | Status: DC

## 2019-05-06 MED FILL — OMEPRAZOLE 20 MG CAP: 20 | 30 days supply | Qty: 30 | Fill #0

## 2019-05-06 MED FILL — CYCLOBENZAPRINE 10 MG TAB: 10 | 15 days supply | Qty: 30 | Fill #0

## 2019-05-06 MED FILL — MONTELUKAST SOD 10 MG TAB: 10 | 30 days supply | Qty: 30 | Fill #0

## 2019-05-06 MED FILL — busPIRone HCL 15 MG TABS: 15 | 30 days supply | Qty: 60 | Fill #0
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
## 2019-05-07 LAB — COMPREHENSIVE METABOLIC PANEL
ALT: 15 IU/L (ref 0–44)
AST: 13 IU/L (ref 0–40)
Albumin/Globulin Ratio: 2 (ref 1.2–2.2)
Albumin: 4.5 g/dL (ref 4.0–5.0)
Alkaline Phosphatase: 116 IU/L (ref 39–117)
BUN/Creatinine Ratio: 13 (ref 9–20)
BUN: 16 mg/dL (ref 6–24)
Bilirubin Total: 0.3 mg/dL (ref 0.0–1.2)
CO2: 26 mmol/L (ref 20–29)
Calcium: 9.3 mg/dL (ref 8.7–10.2)
Chloride: 100 mmol/L (ref 96–106)
Creatinine, Ser: 1.22 mg/dL (ref 0.76–1.27)
GFR calc Af Amer: 81 mL/min/{1.73_m2} (ref >59)
GFR calc non Af Amer: 70 mL/min/{1.73_m2} (ref >59)
Globulin, Total: 2.3 g/dL (ref 1.5–4.5)
Glucose: 98 mg/dL (ref 65–99)
Potassium: 4.1 mmol/L (ref 3.5–5.2)
Sodium: 138 mmol/L (ref 134–144)
Total Protein: 6.8 g/dL (ref 6.0–8.5)

## 2019-05-07 LAB — CBC WITH DIFFERENTIAL/PLATELET
Basophils Absolute: 0.1 10*3/uL (ref 0.0–0.2)
Basos: 1 %
EOS (ABSOLUTE): 0.2 10*3/uL (ref 0.0–0.4)
Eos: 2 %
Hematocrit: 48.4 % (ref 37.5–51.0)
Hemoglobin: 16.5 g/dL (ref 13.0–17.7)
Immature Grans (Abs): 0 10*3/uL (ref 0.0–0.1)
Immature Granulocytes: 0 %
Lymphocytes Absolute: 1.9 10*3/uL (ref 0.7–3.1)
Lymphs: 30 %
MCH: 31 pg (ref 26.6–33.0)
MCHC: 34.1 g/dL (ref 31.5–35.7)
MCV: 91 fL (ref 79–97)
Monocytes Absolute: 0.6 10*3/uL (ref 0.1–0.9)
Monocytes: 10 %
Neutrophils Absolute: 3.5 10*3/uL (ref 1.4–7.0)
Neutrophils: 57 %
Platelets: 239 10*3/uL (ref 150–450)
RBC: 5.33 x10E6/uL (ref 4.14–5.80)
RDW: 12.7 % (ref 11.6–15.4)
WBC: 6.3 10*3/uL (ref 3.4–10.8)

## 2019-05-07 LAB — LIPID PANEL
Chol/HDL Ratio: 4.8 ratio (ref 0.0–5.0)
Cholesterol, Total: 174 mg/dL (ref 100–199)
HDL: 36 mg/dL — ABNORMAL LOW (ref >39)
LDL Chol Calc (NIH): 124 mg/dL — ABNORMAL HIGH (ref 0–99)
Triglycerides: 71 mg/dL (ref 0–149)
VLDL Cholesterol Cal: 14 mg/dL (ref 5–40)

## 2019-05-07 LAB — VITAMIN D 25 HYDROXY (VIT D DEFICIENCY, FRACTURES): Vit D, 25-Hydroxy: 13.4 ng/mL — ABNORMAL LOW (ref 30.0–100.0)

## 2019-05-07 LAB — TSH: TSH: 0.994 u[IU]/mL (ref 0.450–4.500)

## 2019-05-07 LAB — VITAMIN B12: Vitamin B-12: 286 pg/mL (ref 232–1245)

## 2019-05-09 ENCOUNTER — Other Ambulatory Visit: Payer: Self-pay | Admitting: Family Medicine

## 2019-05-09 ENCOUNTER — Encounter: Payer: Self-pay | Admitting: Family Medicine

## 2019-05-09 DIAGNOSIS — E559 Vitamin D deficiency, unspecified: Secondary | ICD-10-CM

## 2019-05-09 MED ORDER — VITAMIN D (ERGOCALCIFEROL) 1.25 MG (50000 UNIT) PO CAPS: 50000.0000 [IU] | ORAL_CAPSULE | ORAL | 6 refills | Status: DC

## 2019-05-09 MED FILL — VIT D2 1.25 MG (50,000 UNIT: 1.25 MG | 84 days supply | Qty: 12 | Fill #0

## 2019-05-24 MED FILL — !VENTOLIN HFA INHALER: 108 (90 BAS | 25 days supply | Qty: 18 | Fill #5

## 2019-06-21 MED FILL — $TRELEGY ELLIPTA 100-62.5-2: 100-62.5-25 | 60 days supply | Qty: 120 | Fill #1

## 2019-06-21 MED FILL — !VENTOLIN HFA INHALER: 108 (90 BAS | 25 days supply | Qty: 18 | Fill #6

## 2019-07-25 MED FILL — VENTOLIN HFA 90 MCG INHALER: 108 (90 BAS | 16 days supply | Qty: 18 | Fill #7

## 2019-08-26 ENCOUNTER — Encounter: Payer: Self-pay | Admitting: Pulmonary Disease

## 2019-08-26 ENCOUNTER — Other Ambulatory Visit: Payer: Self-pay

## 2019-08-26 ENCOUNTER — Ambulatory Visit (INDEPENDENT_AMBULATORY_CARE_PROVIDER_SITE_OTHER): Payer: Self-pay

## 2019-08-26 ENCOUNTER — Ambulatory Visit (INDEPENDENT_AMBULATORY_CARE_PROVIDER_SITE_OTHER): Payer: Self-pay | Admitting: Pulmonary Disease

## 2019-08-26 VITALS — BP 126/70 | HR 85 | Temp 97.2°F | Ht 70.0 in | Wt 190.8 lb

## 2019-08-26 DIAGNOSIS — J449 Chronic obstructive pulmonary disease, unspecified: Secondary | ICD-10-CM

## 2019-08-26 NOTE — Progress Notes (Signed)
Aaron [name]    [id number]    10-Dec-1970  Primary Care Physician:Stroud, Ellie Lunch, [name]  Referring Physician: Azzie Glatter, [place],  [place] [address]  Chief complaint:   Follow-up for COPD  HPI: 49 year old with history of vaping THC, asthma, COPD.    History of intubation for asthma/COPD exacerbation, ARDS in February 2019. Admitted with repeat exacerbation on 02/16/2018 with acute exacerbation, dyspnea, wheezing.  This time he turned around quickly with prednisone, nebulizers and was discharged in 1 day. 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  Also smokes cannabis and vapes THC.  His last vaping was on day of admission on 02/16/2018 Travel history: No significant travel history Relevant family history: No significant history of lung disease.   Interim history:  Continues on Trelegy inhaler.  Complains of chronic dyspnea on exertion, fatigue which is unchanged.  Continues to smoke.  States that a pack lasts him about 3 days  Outpatient Encounter Medications as of 08/26/2019  Medication Sig  . albuterol (PROVENTIL) (2.5 MG/3ML) 0.083% nebulizer solution Take 3 mLs (2.5 mg total) by nebulization 3 (three) times daily.  Marland Kitchen albuterol (VENTOLIN HFA) 108 (90 Base) MCG/ACT inhaler Inhale 2 puffs into the lungs every 4 (four) hours as needed for wheezing or shortness of breath.  . busPIRone (BUSPAR) 15 MG tablet Take 1 tablet (15 mg total) by mouth 2 (two) times daily.  . cetirizine (ZYRTEC) 10 MG tablet Take 1 tablet (10 mg total) by mouth daily.  . cholecalciferol (VITAMIN D) 1000 units tablet Take 1,000 Units by mouth daily.  . cyclobenzaprine (FLEXERIL) 10 MG tablet Take 1 tablet (10 mg total) by mouth 2 (two)  times daily as needed for muscle spasms.  . Fluticasone-Umeclidin-Vilant (TRELEGY ELLIPTA) 100-62.5-25 MCG/INH AEPB Inhale 1 puff into the lungs daily.  . montelukast (SINGULAIR) 10 MG tablet Take 1 tablet (10 mg total) by mouth at bedtime.  Marland Kitchen omeprazole (PRILOSEC) 20 MG capsule Take 1 capsule (20 mg total) by mouth daily.  . varenicline (CHANTIX CONTINUING MONTH PAK) 1 MG tablet Take 1 tablet (1 mg total) by mouth 2 (two) times daily.  . Vitamin D, Ergocalciferol, (DRISDOL) 1.25 MG (50000 UT) CAPS capsule Take 1 capsule (50,000 Units total) by mouth every 7 (seven) days.   No facility-administered encounter medications on file as of 08/26/2019.   Physical Exam: Blood pressure 126/70, pulse 85, temperature (!) 97.2 F (36.2 C), temperature source Temporal, height 5\' 10"  (1.778 m), weight 190 lb 12.8 oz (86.5 kg), SpO2 98 %. Gen:      No acute distress HEENT:  EOMI, sclera anicteric Neck:     No masses; no thyromegaly Lungs:    Clear to auscultation bilaterally; normal respiratory effort CV:         Regular rate and rhythm; no murmurs Abd:      + bowel sounds; soft, non-tender; no palpable masses, no distension Ext:    No edema; adequate peripheral perfusion Skin:      Warm and dry; no rash Neuro: alert and oriented x 3 Psych: normal mood and affect  Data Reviewed: Imaging: Chest x-ray 02/16/2018- mild hyperinflation, clear lungs. Chest x-ray 07/09/2018-hyperinflation, mild atelectasis of the right lower lobe  PFTs: Spirometry 07/09/2018 FVC  2.7 [64%], FEV1 1.7 [40%], F/F 61 Severe obstruction  FENO 02/28/2018-23  Labs: CBC 09/20/2017-WBC 4.4, eos 7%, absolute eosinophil count 308. CBC 05/06/2019-WBC 6.3, eos 2%, absolute eosinophil count 126  Alpha-1 antitrypsin 10/09/2018-125, PI MM.  Assessment:  COPD Continue Trelegy inhaler, albuterol as needed  Active smoker Discussed smoking cessation.  He has not tolerated Chantix due to mood disorders We will use nicotine patches.  Time  spent counseling-5 minutes Reassess at return visit.  Plan/Recommendations: - Continue Trelegy - Smoking cessation with nicotine patches.   Chilton Greathouse MD [facility] 08/26/2019, 9:33 AM  CC: Kallie Locks, [name]

## 2019-08-26 NOTE — Addendum Note (Signed)
Addended by: Jacquiline Doe on: 08/26/2019 09:42 AM   Modules accepted: Orders

## 2019-08-26 NOTE — Patient Instructions (Signed)
Continue to work on smoking cessation We will get a chest x-ray today  Follow-up in 6 months.

## 2019-08-28 NOTE — Progress Notes (Signed)
Spoke with pt and notified of results per Dr. Mannam.  Pt verbalized understanding and denied any questions. 

## 2019-08-30 ENCOUNTER — Other Ambulatory Visit: Payer: Self-pay | Admitting: Family Medicine

## 2019-08-30 DIAGNOSIS — J4530 Mild persistent asthma, uncomplicated: Secondary | ICD-10-CM

## 2019-08-30 MED FILL — $VENTOLIN HFA 18G INHALER: 108 (90 BAS | 25 days supply | Qty: 18 | Fill #8

## 2019-08-30 MED FILL — ALBUTEROL SUL 2.5 MG/3 ML S: (2.5 MG/3ML | 10 days supply | Qty: 90 | Fill #0

## 2019-08-30 MED FILL — OMEPRAZOLE 20 MG CAP: 20 | 30 days supply | Qty: 30 | Fill #1

## 2019-09-27 MED FILL — $VENTOLIN HFA 18G INHALER: 108 (90 BAS | 25 days supply | Qty: 18 | Fill #9

## 2019-10-24 ENCOUNTER — Other Ambulatory Visit: Payer: Self-pay | Admitting: Pulmonary Disease

## 2019-10-24 ENCOUNTER — Other Ambulatory Visit: Payer: Self-pay | Admitting: Nurse Practitioner

## 2019-10-24 DIAGNOSIS — J4530 Mild persistent asthma, uncomplicated: Secondary | ICD-10-CM

## 2019-10-24 MED ORDER — ALBUTEROL SULFATE HFA 108 (90 BASE) MCG/ACT IN AERS: 2.0000 | INHALATION_SPRAY | RESPIRATORY_TRACT | 11 refills | Status: DC | PRN

## 2019-10-24 MED FILL — ALBUTEROL SUL 2.5 MG/3 ML S: (2.5 MG/3ML | 10 days supply | Qty: 90 | Fill #1

## 2019-10-24 MED FILL — $VENTOLIN HFA 18G INHALER: 108 (90 BAS | 16 days supply | Qty: 18 | Fill #0

## 2019-10-24 MED FILL — VIT D2 1.25 MG (50,000 UNIT: 1.25 MG | 84 days supply | Qty: 12 | Fill #1

## 2019-11-06 ENCOUNTER — Encounter: Payer: Self-pay | Admitting: Family Medicine

## 2019-11-06 ENCOUNTER — Other Ambulatory Visit: Payer: Self-pay | Admitting: Family Medicine

## 2019-11-06 ENCOUNTER — Ambulatory Visit (INDEPENDENT_AMBULATORY_CARE_PROVIDER_SITE_OTHER): Payer: Self-pay | Admitting: Family Medicine

## 2019-11-06 ENCOUNTER — Other Ambulatory Visit: Payer: Self-pay

## 2019-11-06 VITALS — BP 126/82 | HR 86 | Temp 98.4°F | Resp 17 | Ht 70.0 in | Wt 184.4 lb

## 2019-11-06 DIAGNOSIS — F4321 Adjustment disorder with depressed mood: Secondary | ICD-10-CM | POA: Insufficient documentation

## 2019-11-06 DIAGNOSIS — F419 Anxiety disorder, unspecified: Secondary | ICD-10-CM

## 2019-11-06 DIAGNOSIS — M25511 Pain in right shoulder: Secondary | ICD-10-CM

## 2019-11-06 DIAGNOSIS — J45909 Unspecified asthma, uncomplicated: Secondary | ICD-10-CM

## 2019-11-06 DIAGNOSIS — Z09 Encounter for follow-up examination after completed treatment for conditions other than malignant neoplasm: Secondary | ICD-10-CM

## 2019-11-06 DIAGNOSIS — M25512 Pain in left shoulder: Secondary | ICD-10-CM

## 2019-11-06 DIAGNOSIS — J4530 Mild persistent asthma, uncomplicated: Secondary | ICD-10-CM

## 2019-11-06 DIAGNOSIS — Z720 Tobacco use: Secondary | ICD-10-CM

## 2019-11-06 DIAGNOSIS — J4489 Other specified chronic obstructive pulmonary disease: Secondary | ICD-10-CM | POA: Insufficient documentation

## 2019-11-06 DIAGNOSIS — J449 Chronic obstructive pulmonary disease, unspecified: Secondary | ICD-10-CM

## 2019-11-06 MED ORDER — CYCLOBENZAPRINE HCL 10 MG PO TABS: 10.0000 mg | ORAL_TABLET | Freq: Two times a day (BID) | ORAL | 6 refills | Status: DC | PRN

## 2019-11-06 MED ORDER — TRELEGY ELLIPTA 100-62.5-25 MCG/INH IN AEPB: 1.0000 | INHALATION_SPRAY | Freq: Every day | RESPIRATORY_TRACT | 11 refills | Status: DC

## 2019-11-06 MED FILL — CYCLOBENZAPRINE 10 MG TAB: 10 | 15 days supply | Qty: 30 | Fill #0

## 2019-11-06 MED FILL — $TRELEGY ELLIPTA 100-62.5-2: 100-62.5-25 | 90 days supply | Qty: 180 | Fill #0
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
## 2019-11-18 MED FILL — $TRELEGY ELLIPTA 100-62.5-2: 100-62.5-25 | 90 days supply | Qty: 180 | Fill #0

## 2019-11-18 MED FILL — CYCLOBENZAPRINE 10 MG TAB: 10 | 15 days supply | Qty: 30 | Fill #0

## 2019-11-19 MED FILL — !VENTOLIN HFA INHALER: 108 (90 BAS | 16 days supply | Qty: 18 | Fill #1

## 2019-12-17 MED FILL — $VENTOLIN HFA 18G INHALER: 108 (90 BAS | 48 days supply | Qty: 54 | Fill #2

## 2020-03-30 ENCOUNTER — Other Ambulatory Visit: Payer: Self-pay | Admitting: Family Medicine

## 2020-03-30 ENCOUNTER — Telehealth: Payer: Self-pay | Admitting: Family Medicine

## 2020-03-30 DIAGNOSIS — K2100 Gastro-esophageal reflux disease with esophagitis, without bleeding: Secondary | ICD-10-CM

## 2020-03-30 MED ORDER — OMEPRAZOLE 20 MG PO CPDR: 20.0000 mg | DELAYED_RELEASE_CAPSULE | Freq: Every day | ORAL | 3 refills | Status: DC

## 2020-03-30 MED FILL — OMEPRAZOLE 20 MG CAP: 20 | 30 days supply | Qty: 30 | Fill #0

## 2020-03-31 MED FILL — ALBUTEROL SULFATE HFA 108 (: 108 (90 BAS | 16 days supply | Qty: 18 | Fill #3

## 2020-03-31 NOTE — Telephone Encounter (Signed)
Done

## 2020-04-03 ENCOUNTER — Other Ambulatory Visit: Payer: Self-pay

## 2020-04-03 ENCOUNTER — Encounter: Payer: Self-pay | Admitting: Family Medicine

## 2020-04-03 ENCOUNTER — Ambulatory Visit (INDEPENDENT_AMBULATORY_CARE_PROVIDER_SITE_OTHER): Payer: Self-pay | Admitting: Family Medicine

## 2020-04-03 VITALS — BP 112/71 | HR 100 | Temp 99.1°F | Ht 66.54 in | Wt 180.0 lb

## 2020-04-03 DIAGNOSIS — Z0000 Encounter for general adult medical examination without abnormal findings: Secondary | ICD-10-CM

## 2020-04-03 DIAGNOSIS — J4530 Mild persistent asthma, uncomplicated: Secondary | ICD-10-CM

## 2020-04-03 DIAGNOSIS — Z09 Encounter for follow-up examination after completed treatment for conditions other than malignant neoplasm: Secondary | ICD-10-CM

## 2020-04-03 DIAGNOSIS — J449 Chronic obstructive pulmonary disease, unspecified: Secondary | ICD-10-CM

## 2020-04-03 DIAGNOSIS — Z23 Encounter for immunization: Secondary | ICD-10-CM

## 2020-04-03 DIAGNOSIS — J4489 Other specified chronic obstructive pulmonary disease: Secondary | ICD-10-CM

## 2020-04-03 DIAGNOSIS — Z720 Tobacco use: Secondary | ICD-10-CM

## 2020-04-03 DIAGNOSIS — F419 Anxiety disorder, unspecified: Secondary | ICD-10-CM
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
## 2020-04-03 NOTE — Progress Notes (Signed)
Follow-up -no issues Vit D 1.25mg  needs refill Flu shot given

## 2020-04-04 LAB — CBC WITH DIFFERENTIAL/PLATELET
Basophils Absolute: 0.1 10*3/uL (ref 0.0–0.2)
Basos: 1 %
EOS (ABSOLUTE): 0.1 10*3/uL (ref 0.0–0.4)
Eos: 1 %
Hematocrit: 44.8 % (ref 37.5–51.0)
Hemoglobin: 15.2 g/dL (ref 13.0–17.7)
Immature Grans (Abs): 0 10*3/uL (ref 0.0–0.1)
Immature Granulocytes: 0 %
Lymphocytes Absolute: 1.8 10*3/uL (ref 0.7–3.1)
Lymphs: 28 %
MCH: 30.8 pg (ref 26.6–33.0)
MCHC: 33.9 g/dL (ref 31.5–35.7)
MCV: 91 fL (ref 79–97)
Monocytes Absolute: 0.5 10*3/uL (ref 0.1–0.9)
Monocytes: 8 %
Neutrophils Absolute: 4.1 10*3/uL (ref 1.4–7.0)
Neutrophils: 62 %
Platelets: 226 10*3/uL (ref 150–450)
RBC: 4.93 x10E6/uL (ref 4.14–5.80)
RDW: 13.1 % (ref 11.6–15.4)
WBC: 6.5 10*3/uL (ref 3.4–10.8)

## 2020-04-04 LAB — LIPID PANEL
Chol/HDL Ratio: 4.4 ratio (ref 0.0–5.0)
Cholesterol, Total: 179 mg/dL (ref 100–199)
HDL: 41 mg/dL (ref >39)
LDL Chol Calc (NIH): 127 mg/dL — ABNORMAL HIGH (ref 0–99)
Triglycerides: 58 mg/dL (ref 0–149)
VLDL Cholesterol Cal: 11 mg/dL (ref 5–40)

## 2020-04-04 LAB — COMPREHENSIVE METABOLIC PANEL
ALT: 16 IU/L (ref 0–44)
AST: 21 IU/L (ref 0–40)
Albumin/Globulin Ratio: 2.2 (ref 1.2–2.2)
Albumin: 4.9 g/dL (ref 4.0–5.0)
Alkaline Phosphatase: 131 IU/L — ABNORMAL HIGH (ref 44–121)
BUN/Creatinine Ratio: 12 (ref 9–20)
BUN: 15 mg/dL (ref 6–24)
Bilirubin Total: 0.3 mg/dL (ref 0.0–1.2)
CO2: 20 mmol/L (ref 20–29)
Calcium: 9.6 mg/dL (ref 8.7–10.2)
Chloride: 100 mmol/L (ref 96–106)
Creatinine, Ser: 1.3 mg/dL — ABNORMAL HIGH (ref 0.76–1.27)
GFR calc Af Amer: 75 mL/min/{1.73_m2} (ref >59)
GFR calc non Af Amer: 65 mL/min/{1.73_m2} (ref >59)
Globulin, Total: 2.2 g/dL (ref 1.5–4.5)
Glucose: 77 mg/dL (ref 65–99)
Potassium: 3.8 mmol/L (ref 3.5–5.2)
Sodium: 141 mmol/L (ref 134–144)
Total Protein: 7.1 g/dL (ref 6.0–8.5)

## 2020-04-04 LAB — VITAMIN D 25 HYDROXY (VIT D DEFICIENCY, FRACTURES): Vit D, 25-Hydroxy: 27.2 ng/mL — ABNORMAL LOW (ref 30.0–100.0)

## 2020-04-04 LAB — TSH: TSH: 1.13 u[IU]/mL (ref 0.450–4.500)

## 2020-04-04 LAB — VITAMIN B12: Vitamin B-12: 307 pg/mL (ref 232–1245)

## 2020-04-08 NOTE — Progress Notes (Signed)
Normal result letter generated and mailed to address on file. 

## 2020-04-09 ENCOUNTER — Ambulatory Visit (INDEPENDENT_AMBULATORY_CARE_PROVIDER_SITE_OTHER): Payer: Self-pay | Admitting: Pulmonary Disease

## 2020-04-09 ENCOUNTER — Other Ambulatory Visit: Payer: Self-pay | Admitting: Pulmonary Disease

## 2020-04-09 ENCOUNTER — Other Ambulatory Visit: Payer: Self-pay

## 2020-04-09 ENCOUNTER — Encounter: Payer: Self-pay | Admitting: Pulmonary Disease

## 2020-04-09 VITALS — BP 120/80 | HR 94 | Temp 98.1°F | Ht 69.0 in | Wt 182.4 lb

## 2020-04-09 DIAGNOSIS — J449 Chronic obstructive pulmonary disease, unspecified: Secondary | ICD-10-CM

## 2020-04-09 DIAGNOSIS — F17210 Nicotine dependence, cigarettes, uncomplicated: Secondary | ICD-10-CM

## 2020-04-09 MED ORDER — NICOTINE 21 MG/24HR TD PT24: 21.0000 mg | MEDICATED_PATCH | Freq: Every day | TRANSDERMAL | 1 refills | Status: DC

## 2020-04-09 MED FILL — NICOTINE 21 MG/24HR PATCH: 21 | 28 days supply | Qty: 28 | Fill #0

## 2020-04-09 NOTE — Patient Instructions (Signed)
Continue the Trelegy inhaler Continue to work on smoking cessation Will order nicotine patches to help with quitting  Follow-up in 1 year.

## 2020-04-09 NOTE — Progress Notes (Signed)
Aaron [name]    [id number]    1970-07-01  Primary Care Physician:Stroud, Rolm Gala, [name]  Referring Physician: Kallie Locks, [name] [address] [place],  [place] [address]  Chief complaint:   Follow-up for COPD  HPI: 49 year old with history of vaping THC, asthma, COPD.    History of intubation for asthma/COPD exacerbation, ARDS in February 2019. Admitted with repeat exacerbation on 02/16/2018 with acute exacerbation, dyspnea, wheezing.  This time he turned around quickly with prednisone, nebulizers and was discharged in 1 day. 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  Also smokes cannabis and vapes THC.  His last vaping was on day of admission on 02/16/2018 Travel history: No significant travel history Relevant family history: No significant history of lung disease.   Interim history:  Continues on Trelegy inhaler.  States that this helps with his breathing Has chronic dyspnea on exertion, cough with white mucus Continues to smoke cigarettes  Outpatient Encounter Medications as of 04/09/2020  Medication Sig  . albuterol (PROVENTIL) (2.5 MG/3ML) 0.083% nebulizer solution TAKE 3 MLS (2.5 MG TOTAL) BY NEBULIZATION 3 (THREE) TIMES DAILY.  Marland Kitchen albuterol (VENTOLIN HFA) 108 (90 Base) MCG/ACT inhaler Inhale 2 puffs into the lungs every 4 (four) hours as needed for wheezing or shortness of breath.  . busPIRone (BUSPAR) 15 MG tablet Take 1 tablet (15 mg total) by mouth 2 (two) times daily.  . cetirizine (ZYRTEC) 10 MG tablet Take 1 tablet (10 mg total) by mouth daily.  . cholecalciferol (VITAMIN D) 1000 units tablet Take 1,000 Units by mouth daily.  . cyclobenzaprine (FLEXERIL) 10 MG tablet Take 1 tablet (10 mg total) by mouth 2 (two) times  daily as needed for muscle spasms.  . Fluticasone-Umeclidin-Vilant (TRELEGY ELLIPTA) 100-62.5-25 MCG/INH AEPB Inhale 1 puff into the lungs daily.  Marland Kitchen omeprazole (PRILOSEC) 20 MG capsule Take 1 capsule (20 mg total) by mouth daily.  . Vitamin D, Ergocalciferol, (DRISDOL) 1.25 MG (50000 UT) CAPS capsule Take 1 capsule (50,000 Units total) by mouth every 7 (seven) days.  . montelukast (SINGULAIR) 10 MG tablet Take 1 tablet (10 mg total) by mouth at bedtime.   No facility-administered encounter medications on file as of 04/09/2020.   Physical Exam: Blood pressure 120/80, pulse 94, temperature 98.1 F (36.7 C), temperature source Temporal, height 5\' 9"  (1.753 m), weight 182 lb 6.4 oz (82.7 kg), SpO2 99 %. Gen:      No acute distress HEENT:  EOMI, sclera anicteric Neck:     No masses; no thyromegaly Lungs:    Clear to auscultation bilaterally; normal respiratory effort CV:         Regular rate and rhythm; no murmurs Abd:      + bowel sounds; soft, non-tender; no palpable masses, no distension Ext:    No edema; adequate peripheral perfusion Skin:      Warm and dry; no rash Neuro: alert and oriented x 3 Psych: normal mood and affect  Data Reviewed: Imaging: Chest x-ray 02/16/2018- mild hyperinflation, clear lungs. Chest x-ray 07/09/2018-hyperinflation, mild atelectasis of the right lower lobe Chest x-ray 08/26/2019-hyperinflation with no acute abnormality I have reviewed the images personally.  PFTs: Spirometry 07/09/2018 FVC 2.7 [64%], FEV1 1.7 [40%], F/F 61 Severe obstruction  FENO 02/28/2018-23  Labs:  CBC 09/20/2017-WBC 4.4, eos 7%, absolute eosinophil count 308. CBC 05/06/2019-WBC 6.3, eos 2%, absolute eosinophil count 126  Alpha-1 antitrypsin 10/09/2018-125, PI MM.  Assessment:  COPD Continue Trelegy inhaler, albuterol as needed  Active smoker Discussed smoking cessation.  He has not tolerated Chantix due to mood disorders Order nicotine patches.  Time spent counseling-5  minutes Reassess at return visit.  We will need referral for low-dose screening CT when he turns 51  Health maintenance Pneumovax 02/25/2017 Up-to-date with flu  Has not received COVID-19 vaccine yet.  We talked about it today and he is considering getting it now.  Plan/Recommendations: - Continue Trelegy - Smoking cessation with nicotine patches.   Chilton Greathouse MD [facility] 04/09/2020, 11:23 AM  CC: Kallie Locks, [name]

## 2020-05-14 MED FILL — CYCLOBENZAPRINE 10 MG TAB: 10 | 15 days supply | Qty: 30 | Fill #1

## 2020-05-14 MED FILL — OMEPRAZOLE 20 MG CAP: 20 | 30 days supply | Qty: 30 | Fill #1

## 2020-05-14 MED FILL — NICOTINE 21 MG/24HR PATCH: 21 | 28 days supply | Qty: 28 | Fill #0

## 2020-05-14 MED FILL — ALBUTEROL SULFATE HFA 108 (: 108 (90 BAS | 16 days supply | Qty: 18 | Fill #4

## 2020-07-20 MED FILL — ALBUTEROL SULFATE HFA 108 (: 108 (90 BAS | 16 days supply | Qty: 18 | Fill #5

## 2020-07-20 MED FILL — CYCLOBENZAPRINE 10 MG TAB: 10 | 15 days supply | Qty: 30 | Fill #2

## 2020-09-02 ENCOUNTER — Other Ambulatory Visit: Payer: Self-pay

## 2020-09-02 MED FILL — Albuterol Sulfate Inhal Aero 108 MCG/ACT (90MCG Base Equiv): RESPIRATORY_TRACT | 16 days supply | Qty: 18 | Fill #0 | Status: AC

## 2020-10-02 ENCOUNTER — Ambulatory Visit: Payer: Self-pay | Admitting: Family Medicine

## 2020-10-02 ENCOUNTER — Ambulatory Visit: Payer: Self-pay | Admitting: Nurse Practitioner

## 2020-11-10 ENCOUNTER — Other Ambulatory Visit: Payer: Self-pay | Admitting: Nurse Practitioner

## 2020-11-10 ENCOUNTER — Telehealth: Payer: Self-pay

## 2020-11-10 ENCOUNTER — Other Ambulatory Visit: Payer: Self-pay

## 2020-11-10 DIAGNOSIS — J4530 Mild persistent asthma, uncomplicated: Secondary | ICD-10-CM

## 2020-11-10 DIAGNOSIS — M25511 Pain in right shoulder: Secondary | ICD-10-CM

## 2020-11-10 MED ORDER — CYCLOBENZAPRINE HCL 10 MG PO TABS
ORAL_TABLET | Freq: Two times a day (BID) | ORAL | 6 refills | Status: AC | PRN
  Filled 2020-11-10: qty 30, 15d supply, fill #0

## 2020-11-10 MED ORDER — ALBUTEROL SULFATE (2.5 MG/3ML) 0.083% IN NEBU
2.5000 mg | INHALATION_SOLUTION | Freq: Three times a day (TID) | RESPIRATORY_TRACT | 11 refills | Status: DC
  Filled 2020-11-10: qty 90, 10d supply, fill #0

## 2020-11-10 MED ORDER — ALBUTEROL SULFATE HFA 108 (90 BASE) MCG/ACT IN AERS
2.0000 | INHALATION_SPRAY | RESPIRATORY_TRACT | 11 refills | Status: DC | PRN
  Filled 2020-11-10: qty 18, 16d supply, fill #0

## 2020-11-10 MED FILL — Omeprazole Cap Delayed Release 20 MG: ORAL | 30 days supply | Qty: 30 | Fill #0 | Status: AC

## 2020-11-10 NOTE — Telephone Encounter (Signed)
Inhaler Solution Muscle relaxer  [facility]

## 2020-11-11 NOTE — Telephone Encounter (Signed)
Medications were refilled.  

## 2020-11-22 ENCOUNTER — Encounter (HOSPITAL_COMMUNITY): Payer: Self-pay

## 2020-11-22 ENCOUNTER — Emergency Department (HOSPITAL_COMMUNITY): Payer: Self-pay

## 2020-11-22 ENCOUNTER — Other Ambulatory Visit: Payer: Self-pay

## 2020-11-22 ENCOUNTER — Observation Stay (HOSPITAL_COMMUNITY)
Admission: EM | Admit: 2020-11-22 | Discharge: 2020-11-23 | Disposition: A | Discharge status: Home / Self Care | Payer: Self-pay | Attending: Internal Medicine | Admitting: Internal Medicine

## 2020-11-22 DIAGNOSIS — F17210 Nicotine dependence, cigarettes, uncomplicated: Secondary | ICD-10-CM | POA: Insufficient documentation

## 2020-11-22 DIAGNOSIS — J45909 Unspecified asthma, uncomplicated: Secondary | ICD-10-CM | POA: Insufficient documentation

## 2020-11-22 DIAGNOSIS — J441 Chronic obstructive pulmonary disease with (acute) exacerbation: Principal | ICD-10-CM | POA: Diagnosis present

## 2020-11-22 DIAGNOSIS — E876 Hypokalemia: Secondary | ICD-10-CM | POA: Insufficient documentation

## 2020-11-22 DIAGNOSIS — Z720 Tobacco use: Secondary | ICD-10-CM

## 2020-11-22 DIAGNOSIS — J449 Chronic obstructive pulmonary disease, unspecified: Secondary | ICD-10-CM

## 2020-11-22 DIAGNOSIS — Z20822 Contact with and (suspected) exposure to covid-19: Secondary | ICD-10-CM | POA: Insufficient documentation

## 2020-11-22 DIAGNOSIS — Z79899 Other long term (current) drug therapy: Secondary | ICD-10-CM | POA: Insufficient documentation

## 2020-11-22 DIAGNOSIS — J4530 Mild persistent asthma, uncomplicated: Secondary | ICD-10-CM

## 2020-11-22 DIAGNOSIS — R0603 Acute respiratory distress: Secondary | ICD-10-CM

## 2020-11-22 LAB — CBC WITH DIFFERENTIAL/PLATELET
Abs Immature Granulocytes: 0.03 10*3/uL (ref 0.00–0.07)
Basophils Absolute: 0.1 10*3/uL (ref 0.0–0.1)
Basophils Relative: 2 %
Eosinophils Absolute: 0.4 10*3/uL (ref 0.0–0.5)
Eosinophils Relative: 5 %
HCT: 46 % (ref 39.0–52.0)
Hemoglobin: 15.4 g/dL (ref 13.0–17.0)
Immature Granulocytes: 0 %
Lymphocytes Relative: 38 %
Lymphs Abs: 2.8 10*3/uL (ref 0.7–4.0)
MCH: 31.4 pg (ref 26.0–34.0)
MCHC: 33.5 g/dL (ref 30.0–36.0)
MCV: 93.9 fL (ref 80.0–100.0)
Monocytes Absolute: 0.6 10*3/uL (ref 0.1–1.0)
Monocytes Relative: 9 %
Neutro Abs: 3.3 10*3/uL (ref 1.7–7.7)
Neutrophils Relative %: 46 %
Platelets: 215 10*3/uL (ref 150–400)
RBC: 4.9 MIL/uL (ref 4.22–5.81)
RDW: 13.3 % (ref 11.5–15.5)
WBC: 7.3 10*3/uL (ref 4.0–10.5)
nRBC: 0 % (ref 0.0–0.2)

## 2020-11-22 LAB — BASIC METABOLIC PANEL
Anion gap: 5 (ref 5–15)
BUN: 15 mg/dL (ref 6–20)
CO2: 28 mmol/L (ref 22–32)
Calcium: 8.9 mg/dL (ref 8.9–10.3)
Chloride: 108 mmol/L (ref 98–111)
Creatinine, Ser: 1.26 mg/dL — ABNORMAL HIGH (ref 0.61–1.24)
GFR, Estimated: >60 mL/min (ref >60)
Glucose, Bld: 125 mg/dL — ABNORMAL HIGH (ref 70–99)
Potassium: 3.4 mmol/L — ABNORMAL LOW (ref 3.5–5.1)
Sodium: 141 mmol/L (ref 135–145)

## 2020-11-22 LAB — RESP PANEL BY RT-PCR (FLU A&B, COVID) ARPGX2
Influenza A by PCR: NEGATIVE
Influenza B by PCR: NEGATIVE
SARS Coronavirus 2 by RT PCR: NEGATIVE

## 2020-11-22 LAB — BRAIN NATRIURETIC PEPTIDE: B Natriuretic Peptide: 14.5 pg/mL (ref 0.0–100.0)

## 2020-11-22 MED ORDER — ACETAMINOPHEN 325 MG PO TABS: 650.0000 mg | ORAL_TABLET | Freq: Four times a day (QID) | ORAL | Status: DC | PRN

## 2020-11-22 MED ORDER — BUDESONIDE 0.25 MG/2ML IN SUSP
0.2500 mg | Freq: Two times a day (BID) | RESPIRATORY_TRACT | Status: DC
Start: 2020-11-22 — End: 2020-11-23
  Administered 2020-11-22 – 2020-11-23 (×2): 0.25 mg via RESPIRATORY_TRACT
  Filled 2020-11-22: qty 2

## 2020-11-22 MED ORDER — POTASSIUM CHLORIDE CRYS ER 20 MEQ PO TBCR
20.0000 meq | EXTENDED_RELEASE_TABLET | Freq: Once | ORAL | Status: AC
  Administered 2020-11-23: 20 meq via ORAL
  Filled 2020-11-22: qty 1

## 2020-11-22 MED ORDER — ACETAMINOPHEN 650 MG RE SUPP: 650.0000 mg | Freq: Four times a day (QID) | RECTAL | Status: DC | PRN

## 2020-11-22 MED ORDER — ALBUTEROL SULFATE (2.5 MG/3ML) 0.083% IN NEBU
2.5000 mg | INHALATION_SOLUTION | Freq: Four times a day (QID) | RESPIRATORY_TRACT | Status: DC
  Administered 2020-11-22 – 2020-11-23 (×3): 2.5 mg via RESPIRATORY_TRACT
  Filled 2020-11-22 (×3): qty 3

## 2020-11-22 MED ORDER — ALBUTEROL SULFATE (2.5 MG/3ML) 0.083% IN NEBU
INHALATION_SOLUTION | RESPIRATORY_TRACT | Status: AC
  Administered 2020-11-22: 10 mg
  Filled 2020-11-22: qty 12

## 2020-11-22 MED ORDER — ENOXAPARIN SODIUM 40 MG/0.4ML IJ SOSY
40.0000 mg | PREFILLED_SYRINGE | INTRAMUSCULAR | Status: DC
  Filled 2020-11-22: qty 0.4

## 2020-11-22 MED ORDER — METHYLPREDNISOLONE SODIUM SUCC 40 MG IJ SOLR
40.0000 mg | Freq: Two times a day (BID) | INTRAMUSCULAR | Status: DC
  Administered 2020-11-23: 40 mg via INTRAVENOUS
  Filled 2020-11-22: qty 1

## 2020-11-22 MED ORDER — MONTELUKAST SODIUM 10 MG PO TABS
10.0000 mg | ORAL_TABLET | Freq: Every day | ORAL | Status: DC
  Filled 2020-11-22: qty 1

## 2020-11-22 MED ORDER — ALBUTEROL (5 MG/ML) CONTINUOUS INHALATION SOLN
10.0000 mg/h | INHALATION_SOLUTION | RESPIRATORY_TRACT | Status: DC
  Administered 2020-11-22: 10 mg/h via RESPIRATORY_TRACT

## 2020-11-22 MED ORDER — ALBUTEROL SULFATE (2.5 MG/3ML) 0.083% IN NEBU: 2.5000 mg | INHALATION_SOLUTION | RESPIRATORY_TRACT | Status: DC | PRN

## 2020-11-22 MED ORDER — IPRATROPIUM BROMIDE 0.02 % IN SOLN
0.5000 mg | Freq: Four times a day (QID) | RESPIRATORY_TRACT | Status: DC
  Administered 2020-11-22 – 2020-11-23 (×3): 0.5 mg via RESPIRATORY_TRACT
  Filled 2020-11-22 (×3): qty 2.5
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
## 2020-11-22 NOTE — ED Notes (Signed)
RT educated pt on importance of proper management of Asthma and COPD w/medication usage/avoiding things that trigger/smoking cessation as well. RT discussed with pt different methods to help him avoid excerbations/decrease their frequecy in the future. Pt able to express understanding of teaching without difficulty. RT will continue to monitor.

## 2020-11-22 NOTE — ED Notes (Signed)
Pt sitting on the side of the bed comfortably and in NAD at this time.
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
## 2020-11-22 NOTE — ED Triage Notes (Signed)
Pt arrived to ED via EMS from home as a Respiratory Distress that started as asthma exacerbation that started this morning. Pt attempted to relieve s/s w/ home neb txs w/ minimal relief. EMS was called and once on scene pt had absent lung sounds and was 94% while using his home neb tx. EMS placed 18g L AC and gave 125mg  solumedrol, 2g mag, 5mg  albuterol, and 0.5mg  atrovent. EMS then reports pt then had audible expiratory wheezing. EMS VS: 170/110, 100% on 8L neb tx mask, RR 35-40.   Pt labored, tachypneic, diaphoretic upon arrival to ED.

## 2020-11-23 ENCOUNTER — Other Ambulatory Visit: Payer: Self-pay

## 2020-11-23 LAB — CBC
HCT: 46.1 % (ref 39.0–52.0)
Hemoglobin: 15.3 g/dL (ref 13.0–17.0)
MCH: 31.2 pg (ref 26.0–34.0)
MCHC: 33.2 g/dL (ref 30.0–36.0)
MCV: 93.9 fL (ref 80.0–100.0)
Platelets: 202 10*3/uL (ref 150–400)
RBC: 4.91 MIL/uL (ref 4.22–5.81)
RDW: 13.2 % (ref 11.5–15.5)
WBC: 7.9 10*3/uL (ref 4.0–10.5)
nRBC: 0 % (ref 0.0–0.2)

## 2020-11-23 LAB — CREATININE, SERUM
Creatinine, Ser: 1.27 mg/dL — ABNORMAL HIGH (ref 0.61–1.24)
GFR, Estimated: >60 mL/min (ref >60)

## 2020-11-23 LAB — HIV ANTIBODY (ROUTINE TESTING W REFLEX): HIV Screen 4th Generation wRfx: NONREACTIVE

## 2020-11-23 MED ORDER — ALBUTEROL SULFATE HFA 108 (90 BASE) MCG/ACT IN AERS
2.0000 | INHALATION_SPRAY | RESPIRATORY_TRACT | 11 refills | Status: AC | PRN
  Filled 2020-11-23 – 2020-12-03 (×2): qty 18, 16d supply, fill #0

## 2020-11-23 MED ORDER — PREDNISONE 20 MG PO TABS
40.0000 mg | ORAL_TABLET | Freq: Every day | ORAL | 0 refills | Status: AC
  Filled 2020-11-23 – 2020-12-03 (×2): qty 6, 3d supply, fill #0

## 2020-11-23 MED ORDER — TRELEGY ELLIPTA 100-62.5-25 MCG/INH IN AEPB
1.0000 | INHALATION_SPRAY | Freq: Every day | RESPIRATORY_TRACT | 11 refills | Status: DC
  Filled 2020-11-23: qty 28, 30d supply, fill #0
  Filled 2020-12-03: qty 60, 30d supply, fill #0

## 2020-11-23 MED ORDER — ALBUTEROL SULFATE (2.5 MG/3ML) 0.083% IN NEBU
2.5000 mg | INHALATION_SOLUTION | Freq: Three times a day (TID) | RESPIRATORY_TRACT | 11 refills | Status: AC
  Filled 2020-11-23 – 2020-12-03 (×2): qty 90, 10d supply, fill #0

## 2020-11-23 NOTE — Discharge Summary (Signed)
Physician Discharge Summary  Aaron FusDarrick L [name] ZOX:[id number] DOB: 03/05/1971 DOA: 11/22/2020  PCP: Aaron MerinoKing, Aaron M, NP  Admit date: 11/22/2020  Discharge date: 11/23/2020  Admitted From:Home  Disposition:  Home  Recommendations for Outpatient Follow-up:  Follow up with PCP in 1-2 weeks Continue prednisone for 3 more days as prescribed Refills on albuterol inhalers and Trilegy prescribed Counseled on smoking cessation  Home Health:None  Equipment/Devices:None  Discharge Condition:Stable  CODE STATUS: Full  Diet recommendation: Heart Healthy  Brief/Interim Summary: Per HPI: Aaron [name] is a 50 y.o. male with history of COPD and asthma ongoing tobacco abuse presents to the ER because of sudden onset of worsening shortness of breath.  Patient states he was breathing this morning later on when he mowed the yard he became more acutely short of breath at this point EMS was called.  Denies any chest pain or productive cough fever or chills.  He initially required bipap support and was started on breathing treatments and solumedrol with rapid improvement in symptoms. COVID testing negative and CXR unremarkable. He is stating he is "100% better" and would like to be discharged. He will receive refills on his breathing treatments along with a 3 day course of prednisone. He states he has follow up with his PCP on 7/28.   Discharge Diagnoses:  Principal Problem:   COPD exacerbation (HCC)  Principal discharge diagnosis: Acute asthmatic bronchitis/mild copd exacerbation.  Discharge Instructions  Discharge Instructions     Diet - low sodium heart healthy   Complete by: As directed    Increase activity slowly   Complete by: As directed       Allergies as of 11/23/2020       Reactions   Keflex [cephalexin] Diarrhea, Nausea And Vomiting, Rash   hallucinations        Medication List     TAKE these medications    albuterol 108 (90 Base) MCG/ACT inhaler Commonly known as:  VENTOLIN HFA INHALE 2 PUFFS INTO THE LUNGS EVERY 4 (FOUR) HOURS AS NEEDED FOR WHEEZING OR SHORTNESS OF BREATH. What changed: Another medication with the same name was changed. Make sure you understand how and when to take each.   albuterol (2.5 MG/3ML) 0.083% nebulizer solution Commonly known as: PROVENTIL TAKE 3 MLS (2.5 MG TOTAL) BY NEBULIZATION 3 (THREE) TIMES DAILY. What changed:  when to take this additional instructions   busPIRone 15 MG tablet Commonly known as: BUSPAR Take 1 tablet (15 mg total) by mouth 2 (two) times daily. What changed:  when to take this reasons to take this   cetirizine 10 MG tablet Commonly known as: ZYRTEC Take 1 tablet (10 mg total) by mouth daily.   cholecalciferol 1000 units tablet Commonly known as: VITAMIN D Take 1,000 Units by mouth every morning.   cyclobenzaprine 10 MG tablet Commonly known as: FLEXERIL TAKE 1 TABLET (10 MG TOTAL) BY MOUTH 2 (TWO) TIMES DAILY AS NEEDED FOR MUSCLE SPASMS. What changed:  how much to take when to take this   GINSENG PO Take 1 capsule by mouth daily as needed (energy).   montelukast 10 MG tablet Commonly known as: Singulair Take 1 tablet (10 mg total) by mouth at bedtime. What changed: when to take this   nicotine 21 mg/24hr patch Commonly known as: NICODERM CQ - dosed in mg/24 hours PLACE 1 PATCH (21 MG TOTAL) ONTO THE SKIN DAILY.   omeprazole 20 MG capsule Commonly known as: PRILOSEC TAKE 1 CAPSULE (20 MG TOTAL) BY MOUTH DAILY. What  changed: how much to take   predniSONE 20 MG tablet Commonly known as: DELTASONE Take 2 tablets (40 mg total) by mouth daily for 3 days.   Trelegy Ellipta 100-62.5-25 MCG/INH Aepb Generic drug: Fluticasone-Umeclidin-Vilant Inhale 1 puff into the lungs daily.   Vitamin D (Ergocalciferol) 1.25 MG (50000 UNIT) Caps capsule Commonly known as: DRISDOL Take 1 capsule (50,000 Units total) by mouth every 7 (seven) days. What changed: when to take this         Follow-up Information     Aaron Merino, NP. Go in 3 day(s).   Specialty: Adult Health Nurse Practitioner Contact information: [address] [place] [address] [phone]                Allergies  Allergen Reactions   Keflex [Cephalexin] Diarrhea, Nausea And Vomiting and Rash    hallucinations    Consultations: None   Procedures/Studies: DG Chest Port 1 View  Result Date: 11/22/2020 CLINICAL DATA:  sob EXAM: PORTABLE CHEST 1 VIEW COMPARISON:  August 26, 2019 FINDINGS: The cardiomediastinal silhouette is normal in contour. No pleural effusion. No pneumothorax. No acute pleuroparenchymal abnormality. Visualized abdomen is unremarkable. No acute osseous abnormality noted. IMPRESSION: No acute cardiopulmonary abnormality. Electronically Signed   By: Meda Klinefelter MD   On: 11/22/2020 17:20     Discharge Exam: Vitals:   11/23/20 0212 11/23/20 0645  BP:  125/81  Pulse: 84 90  Resp: 17 15  Temp:    SpO2: 100% 95%   Vitals:   11/22/20 2230 11/23/20 0000 11/23/20 0212 11/23/20 0645  BP: 126/75 126/72  125/81  Pulse: 87 82 84 90  Resp: (!) 27 17 17 15   Temp:      TempSrc:      SpO2: 93% 97% 100% 95%  Weight:      Height:        General: Pt is alert, awake, not in acute distress Cardiovascular: RRR, S1/S2 +, no rubs, no gallops Respiratory: CTA bilaterally, no wheezing, no rhonchi Abdominal: Soft, NT, ND, bowel sounds + Extremities: no edema, no cyanosis    The results of significant diagnostics from this hospitalization (including imaging, microbiology, ancillary and laboratory) are listed below for reference.     Microbiology: Recent Results (from the past 240 hour(s))  Resp Panel by RT-PCR (Flu A&B, Covid) Nasopharyngeal Swab     Status: None   Collection Time: 11/22/20  6:39 PM   Specimen: Nasopharyngeal Swab; Nasopharyngeal(NP) swabs in vial transport medium  Result Value Ref Range Status   SARS Coronavirus 2 by RT PCR NEGATIVE NEGATIVE  Final    Comment: (NOTE) SARS-CoV-2 target nucleic acids are NOT DETECTED.  The SARS-CoV-2 RNA is generally detectable in upper respiratory specimens during the acute phase of infection. The lowest concentration of SARS-CoV-2 viral copies this assay can detect is 138 copies/mL. A negative result does not preclude SARS-Cov-2 infection and should not be used as the sole basis for treatment or other patient management decisions. A negative result may occur with  improper specimen collection/handling, submission of specimen other than nasopharyngeal swab, presence of viral mutation(s) within the areas targeted by this assay, and inadequate number of viral copies(<138 copies/mL). A negative result must be combined with clinical observations, patient history, and epidemiological information. The expected result is Negative.  Fact Sheet for Patients:  11/24/20  Fact Sheet for Healthcare Providers:  BloggerCourse.com  This test is no t yet approved or cleared by the [employer] and  has been authorized for detection and/or diagnosis of SARS-CoV-2 by FDA under an Emergency Use Authorization (EUA). This EUA will remain  in effect (meaning this test can be used) for the duration of the COVID-19 declaration under Section 564(b)(1) of the Act, 21 U.S.C.section 360bbb-3(b)(1), unless the authorization is terminated  or revoked sooner.       Influenza A by PCR NEGATIVE NEGATIVE Final   Influenza B by PCR NEGATIVE NEGATIVE Final    Comment: (NOTE) The Xpert Xpress SARS-CoV-2/FLU/RSV plus assay is intended as an aid in the diagnosis of influenza from Nasopharyngeal swab specimens and should not be used as a sole basis for treatment. Nasal washings and aspirates are unacceptable for Xpert Xpress SARS-CoV-2/FLU/RSV testing.  Fact Sheet for Patients: BloggerCourse.com  Fact Sheet for Healthcare  Providers: SeriousBroker.it  This test is not yet approved or cleared by the [place] FDA and has been authorized for detection and/or diagnosis of SARS-CoV-2 by FDA under an Emergency Use Authorization (EUA). This EUA will remain in effect (meaning this test can be used) for the duration of the COVID-19 declaration under Section 564(b)(1) of the Act, 21 U.S.C. section 360bbb-3(b)(1), unless the authorization is terminated or revoked.  Performed at [facility], [address], [place], [place] [address]      Labs: BNP (last 3 results) Recent Labs    11/22/20 1644  BNP 14.5   Basic Metabolic Panel: Recent Labs  Lab 11/22/20 1644 11/23/20 0012  NA 141  --   K 3.4*  --   CL 108  --   CO2 28  --   GLUCOSE 125*  --   BUN 15  --   CREATININE 1.26* 1.27*  CALCIUM 8.9  --    Liver Function Tests: No results for input(s): AST, ALT, ALKPHOS, BILITOT, PROT, ALBUMIN in the last 168 hours. No results for input(s): LIPASE, AMYLASE in the last 168 hours. No results for input(s): AMMONIA in the last 168 hours. CBC: Recent Labs  Lab 11/22/20 1644 11/23/20 0012  WBC 7.3 7.9  NEUTROABS 3.3  --   HGB 15.4 15.3  HCT 46.0 46.1  MCV 93.9 93.9  PLT 215 202   Cardiac Enzymes: No results for input(s): CKTOTAL, CKMB, CKMBINDEX, TROPONINI in the last 168 hours. BNP: Invalid input(s): POCBNP CBG: No results for input(s): GLUCAP in the last 168 hours. D-Dimer No results for input(s): DDIMER in the last 72 hours. Hgb A1c No results for input(s): HGBA1C in the last 72 hours. Lipid Profile No results for input(s): CHOL, HDL, LDLCALC, TRIG, CHOLHDL, LDLDIRECT in the last 72 hours. Thyroid function studies No results for input(s): TSH, T4TOTAL, T3FREE, THYROIDAB in the last 72 hours.  Invalid input(s): FREET3 Anemia work up No results for input(s): VITAMINB12, FOLATE, FERRITIN, TIBC, IRON, RETICCTPCT in the last 72 hours. Urinalysis     Component Value Date/Time   COLORURINE YELLOW 08/31/2017 1016   APPEARANCEUR CLEAR 08/31/2017 1016   LABSPEC 1.020 08/31/2017 1016   PHURINE 7.0 08/31/2017 1016   GLUCOSEU NEGATIVE 08/31/2017 1016   HGBUR NEGATIVE 08/31/2017 1016   BILIRUBINUR Negative 05/06/2019 1128   KETONESUR negative 09/20/2017 0915   KETONESUR NEGATIVE 08/31/2017 1016   PROTEINUR Negative 05/06/2019 1128   PROTEINUR NEGATIVE 08/31/2017 1016   UROBILINOGEN 0.2 05/06/2019 1128   NITRITE Negative 05/06/2019 1128   NITRITE NEGATIVE 08/31/2017 1016   LEUKOCYTESUR Trace (A) 05/06/2019 1128   Sepsis Labs Invalid input(s): PROCALCITONIN,  WBC,  LACTICIDVEN Microbiology Recent Results (from the  past 240 hour(s))  Resp Panel by RT-PCR (Flu A&B, Covid) Nasopharyngeal Swab     Status: None   Collection Time: 11/22/20  6:39 PM   Specimen: Nasopharyngeal Swab; Nasopharyngeal(NP) swabs in vial transport medium  Result Value Ref Range Status   SARS Coronavirus 2 by RT PCR NEGATIVE NEGATIVE Final    Comment: (NOTE) SARS-CoV-2 target nucleic acids are NOT DETECTED.  The SARS-CoV-2 RNA is generally detectable in upper respiratory specimens during the acute phase of infection. The lowest concentration of SARS-CoV-2 viral copies this assay can detect is 138 copies/mL. A negative result does not preclude SARS-Cov-2 infection and should not be used as the sole basis for treatment or other patient management decisions. A negative result may occur with  improper specimen collection/handling, submission of specimen other than nasopharyngeal swab, presence of viral mutation(s) within the areas targeted by this assay, and inadequate number of viral copies(<138 copies/mL). A negative result must be combined with clinical observations, patient history, and epidemiological information. The expected result is Negative.  Fact Sheet for Patients:  BloggerCourse.com  Fact Sheet for Healthcare Providers:   [employer]it  This test is no t yet approved or cleared by the [place] FDA and  has been authorized for detection and/or diagnosis of SARS-CoV-2 by FDA under an Emergency Use Authorization (EUA). This EUA will remain  in effect (meaning this test can be used) for the duration of the COVID-19 declaration under Section 564(b)(1) of the Act, 21 U.S.C.section 360bbb-3(b)(1), unless the authorization is terminated  or revoked sooner.       Influenza A by PCR NEGATIVE NEGATIVE Final   Influenza B by PCR NEGATIVE NEGATIVE Final    Comment: (NOTE) The Xpert Xpress SARS-CoV-2/FLU/RSV plus assay is intended as an aid in the diagnosis of influenza from Nasopharyngeal swab specimens and should not be used as a sole basis for treatment. Nasal washings and aspirates are unacceptable for Xpert Xpress SARS-CoV-2/FLU/RSV testing.  Fact Sheet for Patients: BloggerCourse.com  Fact Sheet for Healthcare Providers: SeriousBroker.it  This test is not yet approved or cleared by the [place] FDA and has been authorized for detection and/or diagnosis of SARS-CoV-2 by FDA under an Emergency Use Authorization (EUA). This EUA will remain in effect (meaning this test can be used) for the duration of the COVID-19 declaration under Section 564(b)(1) of the Act, 21 U.S.C. section 360bbb-3(b)(1), unless the authorization is terminated or revoked.  Performed at [facility], [address] [place], [place] [address]      Time coordinating discharge: 35 minutes  SIGNED:   Erick Blinks, DO [facility] 11/23/2020, 7:06 AM  If 7PM-7AM, please contact night-coverage [email or web address]

## 2020-11-26 ENCOUNTER — Other Ambulatory Visit: Payer: Self-pay

## 2020-11-26 ENCOUNTER — Ambulatory Visit (INDEPENDENT_AMBULATORY_CARE_PROVIDER_SITE_OTHER): Payer: Self-pay | Admitting: Nurse Practitioner

## 2020-11-26 ENCOUNTER — Encounter: Payer: Self-pay | Admitting: Nurse Practitioner

## 2020-11-26 VITALS — BP 122/77 | HR 22 | Temp 98.2°F | Ht 70.0 in | Wt 179.0 lb

## 2020-11-26 DIAGNOSIS — J449 Chronic obstructive pulmonary disease, unspecified: Secondary | ICD-10-CM

## 2020-11-26 DIAGNOSIS — Z720 Tobacco use: Secondary | ICD-10-CM

## 2020-11-26 DIAGNOSIS — J45909 Unspecified asthma, uncomplicated: Secondary | ICD-10-CM

## 2020-11-26 DIAGNOSIS — F419 Anxiety disorder, unspecified: Secondary | ICD-10-CM
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
## 2020-11-26 NOTE — Patient Instructions (Signed)
Steps to Quit Smoking Smoking tobacco is the leading cause of preventable death. It can affect almost every organ in the body. Smoking puts you and people around you at risk for many serious, long-lasting (chronic) diseases. Quitting smoking can be hard, but it is one of the best things that you can do for your health. It is never too late to quit. How do I get ready to quit? When you decide to quit smoking, make a plan to help you succeed. Before you quit: Pick a date to quit. Set a date within the next 2 weeks to give you time to prepare. Write down the reasons why you are quitting. Keep this list in places where you will see it often. Tell your family, friends, and co-workers that you are quitting. Their support is important. Talk with your doctor about the choices that may help you quit. Find out if your health insurance will pay for these treatments. Know the people, places, things, and activities that make you want to smoke (triggers). Avoid them. What first steps can I take to quit smoking? Throw away all cigarettes at home, at work, and in your car. Throw away the things that you use when you smoke, such as ashtrays and lighters. Clean your car. Make sure to empty the ashtray. Clean your home, including curtains and carpets. What can I do to help me quit smoking? Talk with your doctor about taking medicines and seeing a counselor at the same time. You are more likely to succeed when you do both. If you are pregnant or breastfeeding, talk with your doctor about counseling or other ways to quit smoking. Do not take medicine to help you quit smoking unless your doctor tells you to do so. To quit smoking: Quit right away Quit smoking totally, instead of slowly cutting back on how much you smoke over a period of time. Go to counseling. You are more likely to quit if you go to counseling sessions regularly. Take medicine You may take medicines to help you quit. Some medicines need a  prescription, and some you can buy over-the-counter. Some medicines may contain a drug called nicotine to replace the nicotine in cigarettes. Medicines may: Help you to stop having the desire to smoke (cravings). Help to stop the problems that come when you stop smoking (withdrawal symptoms). Your doctor may ask you to use: Nicotine patches, gum, or lozenges. Nicotine inhalers or sprays. Non-nicotine medicine that is taken by mouth. Find resources Find resources and other ways to help you quit smoking and remain smoke-free after you quit. These resources are most helpful when you use them often. They include: Online chats with a counselor. Phone quitlines. Printed self-help materials. Support groups or group counseling. Text messaging programs. Mobile phone apps. Use apps on your mobile phone or tablet that can help you stick to your quit plan. There are many free apps for mobile phones and tablets as well as websites. Examples include Quit Guide from the CDC and smokefree.gov  What things can I do to make it easier to quit?  Talk to your family and friends. Ask them to support and encourage you. Call a phone quitline (1-800-QUIT-NOW), reach out to support groups, or work with a counselor. Ask people who smoke to not smoke around you. Avoid places that make you want to smoke, such as: Bars. Parties. Smoke-break areas at work. Spend time with people who do not smoke. Lower the stress in your life. Stress can make you want to   smoke. Try these things to help your stress: Getting regular exercise. Doing deep-breathing exercises. Doing yoga. Meditating. Doing a body scan. To do this, close your eyes, focus on one area of your body at a time from head to toe. Notice which parts of your body are tense. Try to relax the muscles in those areas. How will I feel when I quit smoking? Day 1 to 3 weeks Within the first 24 hours, you may start to have some problems that come from quitting tobacco.  These problems are very bad 2-3 days after you quit, but they do not often last for more than 2-3 weeks. You may get these symptoms: Mood swings. Feeling restless, nervous, angry, or annoyed. Trouble concentrating. Dizziness. Strong desire for high-sugar foods and nicotine. Weight gain. Trouble pooping (constipation). Feeling like you may vomit (nausea). Coughing or a sore throat. Changes in how the medicines that you take for other issues work in your body. Depression. Trouble sleeping (insomnia). Week 3 and afterward After the first 2-3 weeks of quitting, you may start to notice more positive results, such as: Better sense of smell and taste. Less coughing and sore throat. Slower heart rate. Lower blood pressure. Clearer skin. Better breathing. Fewer sick days. Quitting smoking can be hard. Do not give up if you fail the first time. Some people need to try a few times before they succeed. Do your best to stick to your quit plan, and talk with your doctor if you have any questions or concerns. Summary Smoking tobacco is the leading cause of preventable death. Quitting smoking can be hard, but it is one of the best things that you can do for your health. When you decide to quit smoking, make a plan to help you succeed. Quit smoking right away, not slowly over a period of time. When you start quitting, seek help from your doctor, family, or friends. This information is not intended to replace advice given to you by your health care provider. Make sure you discuss any questions you have with your health care provider. Document Revised: 01/11/2019 Document Reviewed: 07/07/2018 Elsevier Patient Education  2022 [employer]  

## 2020-11-27 ENCOUNTER — Encounter: Payer: Self-pay | Admitting: Nurse Practitioner

## 2020-11-30 ENCOUNTER — Other Ambulatory Visit: Payer: Self-pay

## 2020-12-03 ENCOUNTER — Other Ambulatory Visit: Payer: Self-pay | Admitting: Nurse Practitioner

## 2020-12-03 ENCOUNTER — Other Ambulatory Visit: Payer: Self-pay

## 2020-12-03 ENCOUNTER — Telehealth: Payer: Self-pay | Admitting: Nurse Practitioner

## 2020-12-03 NOTE — Telephone Encounter (Signed)
Pt in lobby asking for Pcp's help regarding   Rx #: [id number]  Fluticasone-Umeclidin-Vilant (TRELEGY ELLIPTA) 100-62.5-25 MCG/INH AEPB [[id number]]   Cost is Too much for this, was told it was $500   Pt inquiring if there are Any samples available or something else pt could try?

## 2020-12-31 ENCOUNTER — Other Ambulatory Visit: Payer: Self-pay

## 2021-01-11 ENCOUNTER — Other Ambulatory Visit: Payer: Self-pay

## 2021-02-26 ENCOUNTER — Ambulatory Visit: Payer: Self-pay | Admitting: Nurse Practitioner

## 2021-10-11 ENCOUNTER — Other Ambulatory Visit (HOSPITAL_COMMUNITY): Payer: Self-pay

## 2022-02-07 ENCOUNTER — Emergency Department (HOSPITAL_COMMUNITY): Payer: 59

## 2022-02-07 ENCOUNTER — Other Ambulatory Visit: Payer: Self-pay

## 2022-02-07 ENCOUNTER — Emergency Department (HOSPITAL_COMMUNITY)
Admission: EM | Admit: 2022-02-07 | Discharge: 2022-02-08 | Disposition: A | Discharge status: Home / Self Care | Payer: 59 | Attending: Emergency Medicine | Admitting: Emergency Medicine

## 2022-02-07 DIAGNOSIS — R0603 Acute respiratory distress: Secondary | ICD-10-CM | POA: Diagnosis not present

## 2022-02-07 DIAGNOSIS — J441 Chronic obstructive pulmonary disease with (acute) exacerbation: Secondary | ICD-10-CM | POA: Diagnosis not present

## 2022-02-07 DIAGNOSIS — J45909 Unspecified asthma, uncomplicated: Secondary | ICD-10-CM | POA: Insufficient documentation

## 2022-02-07 DIAGNOSIS — Z7951 Long term (current) use of inhaled steroids: Secondary | ICD-10-CM | POA: Insufficient documentation

## 2022-02-07 DIAGNOSIS — R0602 Shortness of breath: Secondary | ICD-10-CM | POA: Diagnosis present

## 2022-02-07 LAB — CBC WITH DIFFERENTIAL/PLATELET
Abs Immature Granulocytes: 0.01 10*3/uL (ref 0.00–0.07)
Basophils Absolute: 0.1 10*3/uL (ref 0.0–0.1)
Basophils Relative: 2 %
Eosinophils Absolute: 0.2 10*3/uL (ref 0.0–0.5)
Eosinophils Relative: 3 %
HCT: 45.4 % (ref 39.0–52.0)
Hemoglobin: 15 g/dL (ref 13.0–17.0)
Immature Granulocytes: 0 %
Lymphocytes Relative: 26 %
Lymphs Abs: 1.7 10*3/uL (ref 0.7–4.0)
MCH: 31.1 pg (ref 26.0–34.0)
MCHC: 33 g/dL (ref 30.0–36.0)
MCV: 94 fL (ref 80.0–100.0)
Monocytes Absolute: 0.7 10*3/uL (ref 0.1–1.0)
Monocytes Relative: 10 %
Neutro Abs: 4 10*3/uL (ref 1.7–7.7)
Neutrophils Relative %: 59 %
Platelets: 221 10*3/uL (ref 150–400)
RBC: 4.83 MIL/uL (ref 4.22–5.81)
RDW: 13.1 % (ref 11.5–15.5)
WBC: 6.8 10*3/uL (ref 4.0–10.5)
nRBC: 0 % (ref 0.0–0.2)

## 2022-02-07 LAB — I-STAT VENOUS BLOOD GAS, ED
Acid-Base Excess: 1 mmol/L (ref 0.0–2.0)
Bicarbonate: 26.3 mmol/L (ref 20.0–28.0)
Calcium, Ion: 1.11 mmol/L — ABNORMAL LOW (ref 1.15–1.40)
HCT: 45 % (ref 39.0–52.0)
Hemoglobin: 15.3 g/dL (ref 13.0–17.0)
O2 Saturation: 99 %
Potassium: 3.6 mmol/L (ref 3.5–5.1)
Sodium: 143 mmol/L (ref 135–145)
TCO2: 28 mmol/L (ref 22–32)
pCO2, Ven: 43.9 mmHg — ABNORMAL LOW (ref 44–60)
pH, Ven: 7.385 (ref 7.25–7.43)
pO2, Ven: 148 mmHg — ABNORMAL HIGH (ref 32–45)

## 2022-02-07 LAB — COMPREHENSIVE METABOLIC PANEL
ALT: 19 U/L (ref 0–44)
AST: 20 U/L (ref 15–41)
Albumin: 4.4 g/dL (ref 3.5–5.0)
Alkaline Phosphatase: 77 U/L (ref 38–126)
Anion gap: 10 (ref 5–15)
BUN: 17 mg/dL (ref 6–20)
CO2: 23 mmol/L (ref 22–32)
Calcium: 8.8 mg/dL — ABNORMAL LOW (ref 8.9–10.3)
Chloride: 108 mmol/L (ref 98–111)
Creatinine, Ser: 1.21 mg/dL (ref 0.61–1.24)
GFR, Estimated: >60 mL/min (ref >60)
Glucose, Bld: 123 mg/dL — ABNORMAL HIGH (ref 70–99)
Potassium: 3.6 mmol/L (ref 3.5–5.1)
Sodium: 141 mmol/L (ref 135–145)
Total Bilirubin: 0.5 mg/dL (ref 0.3–1.2)
Total Protein: 7.3 g/dL (ref 6.5–8.1)

## 2022-02-07 LAB — I-STAT CHEM 8, ED
BUN: 20 mg/dL (ref 6–20)
Calcium, Ion: 1.1 mmol/L — ABNORMAL LOW (ref 1.15–1.40)
Chloride: 106 mmol/L (ref 98–111)
Creatinine, Ser: 1.2 mg/dL (ref 0.61–1.24)
Glucose, Bld: 119 mg/dL — ABNORMAL HIGH (ref 70–99)
HCT: 46 % (ref 39.0–52.0)
Hemoglobin: 15.6 g/dL (ref 13.0–17.0)
Potassium: 3.6 mmol/L (ref 3.5–5.1)
Sodium: 144 mmol/L (ref 135–145)
TCO2: 25 mmol/L (ref 22–32)

## 2022-02-07 LAB — TROPONIN I (HIGH SENSITIVITY): Troponin I (High Sensitivity): 4 ng/L (ref <18)

## 2022-02-07 MED ORDER — IPRATROPIUM-ALBUTEROL 0.5-2.5 (3) MG/3ML IN SOLN
3.0000 mL | RESPIRATORY_TRACT | Status: DC
  Administered 2022-02-07: 3 mL via RESPIRATORY_TRACT
  Filled 2022-02-07: qty 3

## 2022-02-07 MED ORDER — MAGNESIUM SULFATE 2 GM/50ML IV SOLN
2.0000 g | Freq: Once | INTRAVENOUS | Status: AC
  Administered 2022-02-07: 2 g via INTRAVENOUS
  Filled 2022-02-07: qty 50

## 2022-02-07 MED ORDER — IPRATROPIUM BROMIDE 0.02 % IN SOLN
1.0000 mg | Freq: Once | RESPIRATORY_TRACT | Status: AC
  Administered 2022-02-07: 1 mg via RESPIRATORY_TRACT
  Filled 2022-02-07: qty 5

## 2022-02-07 MED ORDER — ALBUTEROL SULFATE (2.5 MG/3ML) 0.083% IN NEBU
15.0000 mg | INHALATION_SOLUTION | Freq: Once | RESPIRATORY_TRACT | Status: AC
  Administered 2022-02-07: 15 mg via RESPIRATORY_TRACT
  Filled 2022-02-07 (×2): qty 18

## 2022-02-07 NOTE — ED Notes (Signed)
Pt initially in hallway 11 but transferred to tra a d/t pt needing to be on bipap - RT called my MD Nogle

## 2022-02-07 NOTE — ED Notes (Signed)
Pt taken off of bipap and placed on  at this time by RT

## 2022-02-07 NOTE — ED Triage Notes (Signed)
Pt BIB EMS from home. Pt reports SOB starting earlier this afternoon. Pt was in the low 90s on room air. Hx of asthma and COPD. Pt has wheezing in all fields. No CP.  EMS gave 2 duonebs and 125 solumedrol  ST in 110s BP 146/108

## 2022-02-07 NOTE — ED Provider Notes (Signed)
  [facility] Provider Note   CSN: [id number] Arrival date & time: 02/07/22  1952     History {Add pertinent medical, surgical, social history, OB history to HPI:1} No chief complaint on file.   Greely KAHLIN MARK is a 51 y.o. male.  HPI     Home Medications Prior to Admission medications   Medication Sig Start Date End Date Taking? Authorizing Provider  albuterol (PROVENTIL) (2.5 MG/3ML) 0.083% nebulizer solution TAKE 3 MLS (2.5 MG TOTAL) BY NEBULIZATION 3 (THREE) TIMES DAILY. 11/23/20   Manuella Ghazi, Pratik D, DO  albuterol (VENTOLIN HFA) 108 (90 Base) MCG/ACT inhaler INHALE 2 PUFFS INTO THE LUNGS EVERY 4 (FOUR) HOURS AS NEEDED FOR WHEEZING OR SHORTNESS OF BREATH. 11/23/20 11/23/21  Manuella Ghazi, Pratik D, DO  busPIRone (BUSPAR) 15 MG tablet Take 1 tablet (15 mg total) by mouth 2 (two) times daily. 05/06/19   Azzie Glatter, [name]  cetirizine (ZYRTEC) 10 MG tablet Take 1 tablet (10 mg total) by mouth daily. 07/27/18   Azzie Glatter, [name]  cholecalciferol (VITAMIN D) 1000 units tablet Take 1,000 Units by mouth every morning.    [provider]  Fluticasone-Umeclidin-Vilant (TRELEGY ELLIPTA) 100-62.5-25 MCG/INH AEPB Inhale 1 puff into the lungs daily. 11/23/20   Manuella Ghazi, Pratik D, DO  GINSENG PO Take 1 capsule by mouth daily as needed (energy).    [provider]  montelukast (SINGULAIR) 10 MG tablet Take 1 tablet (10 mg total) by mouth at bedtime. Patient taking differently: Take 10 mg by mouth every morning. 05/06/19 11/22/20  Azzie Glatter, [name]  omeprazole (PRILOSEC) 20 MG capsule TAKE 1 CAPSULE (20 MG TOTAL) BY MOUTH DAILY. 03/30/20 03/30/21  Azzie Glatter, [name]      Allergies    Keflex [cephalexin]    Review of Systems   Review of Systems  Physical Exam Updated Vital Signs There were no vitals taken for this visit. Physical Exam  ED Results / Procedures / Treatments   Labs (all labs ordered are listed, but only abnormal results are  displayed) Labs Reviewed - No data to display  EKG None  Radiology No results found.  Procedures Procedures  {Document cardiac monitor, telemetry assessment procedure when appropriate:1}  Medications Ordered in ED Medications - No data to display  ED Course/ Medical Decision Making/ A&P                           Medical Decision Making  ***  {Document critical care time when appropriate:1} {Document review of labs and clinical decision tools ie heart score, Chads2Vasc2 etc:1}  {Document your independent review of radiology images, and any outside records:1} {Document your discussion with family members, caretakers, and with consultants:1} {Document social determinants of health affecting pt's care:1} {Document your decision making why or why not admission, treatments were needed:1} Final Clinical Impression(s) / ED Diagnoses Final diagnoses:  None    Rx / DC Orders ED Discharge Orders     None

## 2022-02-08 ENCOUNTER — Emergency Department (HOSPITAL_COMMUNITY): Payer: 59

## 2022-02-08 LAB — TROPONIN I (HIGH SENSITIVITY): Troponin I (High Sensitivity): 3 ng/L (ref <18)

## 2022-02-08 MED ORDER — PREDNISONE 20 MG PO TABS: 40.0000 mg | ORAL_TABLET | Freq: Every day | ORAL | 0 refills | Status: DC

## 2022-02-08 MED ORDER — IOHEXOL 350 MG/ML SOLN
80.0000 mL | Freq: Once | INTRAVENOUS | Status: AC | PRN
  Administered 2022-02-08: 80 mL via INTRAVENOUS

## 2022-02-08 NOTE — ED Notes (Signed)
Patient verbalizes understanding of discharge instructions. Opportunity for questioning and answers were provided. Armband removed by staff, pt discharged from ED ambulatory.   

## 2022-02-08 NOTE — Discharge Instructions (Signed)
You were seen today for respiratory distress.  This is likely your COPD/asthma.  Continue your inhaler at home.  Take steroids as prescribed.

## 2022-02-08 NOTE — ED Provider Notes (Signed)
Patient signed out pending CT scan.  In brief presented with respiratory distress and wheezing.  Was initially on BiPAP but this was weaned.  Has a history of COPD/asthma.  But was also significantly tachycardic giving rise to concern for PE.  CT scan does not show any evidence of PE or pneumonia.  Patient had progressive improvement of his respiratory status.  He was weaned off oxygen.  He was ambulatory with minimal symptoms and able to maintain his pulse ox.  He would like to go home.  Feel this is reasonable.  I have encouraged him to use his inhaler scheduled over the next 12 hours.  Will discharge with prednisone.  Physical Exam  BP 129/60   Pulse 92   Temp 98.4 F (36.9 C) (Oral)   Resp 20   Ht 1.778 m (5\' 10" )   Wt 83.9 kg   SpO2 96%   BMI 26.54 kg/m   Physical Exam Awake, alert, no acute distress.   No active wheezing. Procedures  Procedures  ED Course / MDM   Clinical Course as of 02/08/22 0228  Mon Feb 07, 2022  2009 Patient presents to the emergency department with shortness of breath.  Patient states that his breathing is gotten worse over the past few days and he is run out of multiple COPD/asthma medications.  Patient states that today his breathing had significantly worsened which is called ambulance.  On 2010 arrival, patient was markedly tachypneic but maintains oxygen saturations on room air.  Patient was given 2 DuoNebs and 125 mg of Solu-Medrol.  Patient was brought to [facility] for evaluation. [JN]  2308 On Arrival to the emergency department, patient was markedly tachypneic, tripoding, using accessory muscles to breathe.  Patient is maintaining his oxygen saturation on room air however he had diffuse wheezing on auscultation.  Patient was diaphoretic and complained of chest pain.  On arrival to the emergency department, patient was in DuoNeb via facemask.  Patient was quickly transitioned to BiPAP support for work of breathing and given continuous nebulized infusion  through the BiPAP.  On reevaluation shortly after initiation of BiPAP, patient reports breathing significantly improved.  Patient was complaining of ongoing chest pain that point time decision was made to take additional laboratory work-up which included troponin and CT PE study will be obtained to assess for additional etiology of the chest pain and shortness of breath [JN]  2308 Laboratory work-up reassuring, CBC, CMP unremarkable.  VBG on arrival revealed a pH of 7.385, CO2 43.9 [JN]  Tue Feb 08, 2022  0225 Patient much improved.  Patient would like to try to go home.  Patient weaned off oxygen and was able to ambulate and maintain his pulse ox 94 to 96%.  No active wheezing on exam.  No respiratory distress.  He reports that he has an inhaler at home.  Will discharge with steroids.  Encouraged him to use his inhaler every 2-4 hours over the next 12 hours given his acuity of presentation upon arrival. [CH]    Clinical Course User Index [CH] [name], Feb 10, 2022, MD [JN] Nogle, [name], MD   Medical Decision Making Amount and/or Complexity of Data Reviewed Labs: ordered. Radiology: ordered. ECG/medicine tests: ordered.  Risk Prescription drug management.   Problem List Items Addressed This Visit       Respiratory   COPD exacerbation (HCC)   Relevant Medications   ipratropium-albuterol (DUONEB) 0.5-2.5 (3) MG/3ML nebulizer solution 3 mL   predniSONE (DELTASONE) 20 MG tablet  Other Visit Diagnoses     Respiratory distress    -  Primary             [name], Barbette Hair, MD 02/08/22 0230

## 2022-03-30 ENCOUNTER — Inpatient Hospital Stay (HOSPITAL_COMMUNITY)
Admission: EM | Admit: 2022-03-30 | Discharge: 2022-03-31 | DRG: 190 | Disposition: A | Discharge status: Home / Self Care | Payer: Self-pay | Attending: Pulmonary Disease | Admitting: Pulmonary Disease

## 2022-03-30 ENCOUNTER — Other Ambulatory Visit: Payer: Self-pay

## 2022-03-30 ENCOUNTER — Emergency Department (HOSPITAL_COMMUNITY): Payer: Self-pay

## 2022-03-30 DIAGNOSIS — J441 Chronic obstructive pulmonary disease with (acute) exacerbation: Principal | ICD-10-CM | POA: Diagnosis present

## 2022-03-30 DIAGNOSIS — J9602 Acute respiratory failure with hypercapnia: Secondary | ICD-10-CM | POA: Diagnosis present

## 2022-03-30 DIAGNOSIS — F1290 Cannabis use, unspecified, uncomplicated: Secondary | ICD-10-CM | POA: Diagnosis present

## 2022-03-30 DIAGNOSIS — F17210 Nicotine dependence, cigarettes, uncomplicated: Secondary | ICD-10-CM | POA: Diagnosis present

## 2022-03-30 DIAGNOSIS — R000 Tachycardia, unspecified: Secondary | ICD-10-CM | POA: Diagnosis present

## 2022-03-30 DIAGNOSIS — Z881 Allergy status to other antibiotic agents status: Secondary | ICD-10-CM

## 2022-03-30 DIAGNOSIS — J9690 Respiratory failure, unspecified, unspecified whether with hypoxia or hypercapnia: Secondary | ICD-10-CM | POA: Diagnosis present

## 2022-03-30 DIAGNOSIS — J9601 Acute respiratory failure with hypoxia: Secondary | ICD-10-CM | POA: Diagnosis present

## 2022-03-30 DIAGNOSIS — Z8249 Family history of ischemic heart disease and other diseases of the circulatory system: Secondary | ICD-10-CM

## 2022-03-30 DIAGNOSIS — Z833 Family history of diabetes mellitus: Secondary | ICD-10-CM

## 2022-03-30 DIAGNOSIS — G8929 Other chronic pain: Secondary | ICD-10-CM | POA: Diagnosis present

## 2022-03-30 DIAGNOSIS — Z1152 Encounter for screening for COVID-19: Secondary | ICD-10-CM

## 2022-03-30 DIAGNOSIS — E8729 Other acidosis: Secondary | ICD-10-CM | POA: Diagnosis present

## 2022-03-30 DIAGNOSIS — I4891 Unspecified atrial fibrillation: Secondary | ICD-10-CM | POA: Diagnosis present

## 2022-03-30 DIAGNOSIS — F419 Anxiety disorder, unspecified: Secondary | ICD-10-CM | POA: Diagnosis present

## 2022-03-30 DIAGNOSIS — J45901 Unspecified asthma with (acute) exacerbation: Secondary | ICD-10-CM

## 2022-03-30 DIAGNOSIS — J4551 Severe persistent asthma with (acute) exacerbation: Secondary | ICD-10-CM | POA: Diagnosis present

## 2022-03-30 LAB — TROPONIN I (HIGH SENSITIVITY)
Troponin I (High Sensitivity): 14 ng/L (ref <18)
Troponin I (High Sensitivity): 32 ng/L — ABNORMAL HIGH (ref <18)

## 2022-03-30 LAB — LACTIC ACID, PLASMA
Lactic Acid, Venous: 6.2 mmol/L (ref 0.5–1.9)
Lactic Acid, Venous: 1.8 mmol/L (ref 0.5–1.9)

## 2022-03-30 LAB — RAPID URINE DRUG SCREEN, HOSP PERFORMED
Amphetamines: NOT DETECTED
Barbiturates: NOT DETECTED
Benzodiazepines: NOT DETECTED
Cocaine: NOT DETECTED
Opiates: NOT DETECTED
Tetrahydrocannabinol: POSITIVE — AB

## 2022-03-30 LAB — BASIC METABOLIC PANEL
Anion gap: 12 (ref 5–15)
BUN: 20 mg/dL (ref 6–20)
CO2: 23 mmol/L (ref 22–32)
Calcium: 8.8 mg/dL — ABNORMAL LOW (ref 8.9–10.3)
Chloride: 103 mmol/L (ref 98–111)
Creatinine, Ser: 1.72 mg/dL — ABNORMAL HIGH (ref 0.61–1.24)
GFR, Estimated: 48 mL/min — ABNORMAL LOW (ref >60)
Glucose, Bld: 236 mg/dL — ABNORMAL HIGH (ref 70–99)
Potassium: 4.5 mmol/L (ref 3.5–5.1)
Sodium: 138 mmol/L (ref 135–145)

## 2022-03-30 LAB — COMPREHENSIVE METABOLIC PANEL
ALT: 16 U/L (ref 0–44)
AST: 20 U/L (ref 15–41)
Albumin: 3.7 g/dL (ref 3.5–5.0)
Alkaline Phosphatase: 67 U/L (ref 38–126)
Anion gap: 9 (ref 5–15)
BUN: 19 mg/dL (ref 6–20)
CO2: 23 mmol/L (ref 22–32)
Calcium: 7.9 mg/dL — ABNORMAL LOW (ref 8.9–10.3)
Chloride: 106 mmol/L (ref 98–111)
Creatinine, Ser: 1.56 mg/dL — ABNORMAL HIGH (ref 0.61–1.24)
GFR, Estimated: 54 mL/min — ABNORMAL LOW (ref >60)
Glucose, Bld: 123 mg/dL — ABNORMAL HIGH (ref 70–99)
Potassium: 3.6 mmol/L (ref 3.5–5.1)
Sodium: 138 mmol/L (ref 135–145)
Total Bilirubin: 0.5 mg/dL (ref 0.3–1.2)
Total Protein: 6.2 g/dL — ABNORMAL LOW (ref 6.5–8.1)

## 2022-03-30 LAB — CBC WITH DIFFERENTIAL/PLATELET
Abs Immature Granulocytes: 0.1 10*3/uL — ABNORMAL HIGH (ref 0.00–0.07)
Basophils Absolute: 0.1 10*3/uL (ref 0.0–0.1)
Basophils Relative: 1 %
Eosinophils Absolute: 0.4 10*3/uL (ref 0.0–0.5)
Eosinophils Relative: 4 %
HCT: 49.5 % (ref 39.0–52.0)
Hemoglobin: 15.2 g/dL (ref 13.0–17.0)
Immature Granulocytes: 1 %
Lymphocytes Relative: 42 %
Lymphs Abs: 4.4 10*3/uL — ABNORMAL HIGH (ref 0.7–4.0)
MCH: 30.8 pg (ref 26.0–34.0)
MCHC: 30.7 g/dL (ref 30.0–36.0)
MCV: 100.2 fL — ABNORMAL HIGH (ref 80.0–100.0)
Monocytes Absolute: 1 10*3/uL (ref 0.1–1.0)
Monocytes Relative: 10 %
Neutro Abs: 4.4 10*3/uL (ref 1.7–7.7)
Neutrophils Relative %: 42 %
Platelets: 10 10*3/uL — CL (ref 150–400)
RBC: 4.94 MIL/uL (ref 4.22–5.81)
RDW: 13.4 % (ref 11.5–15.5)
WBC: 10.4 10*3/uL (ref 4.0–10.5)
nRBC: 0 % (ref 0.0–0.2)

## 2022-03-30 LAB — BLOOD GAS, ARTERIAL
Acid-base deficit: 11.6 mmol/L — ABNORMAL HIGH (ref 0.0–2.0)
Bicarbonate: 24.2 mmol/L (ref 20.0–28.0)
O2 Saturation: 96.7 %
Patient temperature: 37
pCO2 arterial: 118 mmHg (ref 32–48)
pH, Arterial: <6.95 — CL (ref 7.35–7.45)
pO2, Arterial: 192 mmHg — ABNORMAL HIGH (ref 83–108)

## 2022-03-30 LAB — RESP PANEL BY RT-PCR (FLU A&B, COVID) ARPGX2
Influenza A by PCR: NEGATIVE
Influenza B by PCR: NEGATIVE
SARS Coronavirus 2 by RT PCR: NEGATIVE

## 2022-03-30 LAB — STREP PNEUMONIAE URINARY ANTIGEN: Strep Pneumo Urinary Antigen: NEGATIVE

## 2022-03-30 LAB — HIV ANTIBODY (ROUTINE TESTING W REFLEX): HIV Screen 4th Generation wRfx: NONREACTIVE

## 2022-03-30 MED ORDER — METHYLPREDNISOLONE SODIUM SUCC 125 MG IJ SOLR: 125.0000 mg | Freq: Once | INTRAMUSCULAR | Status: AC

## 2022-03-30 MED ORDER — IPRATROPIUM BROMIDE 0.02 % IN SOLN
1.0000 mg | Freq: Once | RESPIRATORY_TRACT | Status: AC
  Administered 2022-03-30: 1 mg via RESPIRATORY_TRACT

## 2022-03-30 MED ORDER — POLYETHYLENE GLYCOL 3350 17 G PO PACK: 17.0000 g | PACK | Freq: Every day | ORAL | Status: DC | PRN

## 2022-03-30 MED ORDER — IPRATROPIUM BROMIDE 0.02 % IN SOLN
0.5000 mg | Freq: Four times a day (QID) | RESPIRATORY_TRACT | Status: DC
  Administered 2022-03-30: 0.5 mg via RESPIRATORY_TRACT
  Filled 2022-03-30: qty 2.5

## 2022-03-30 MED ORDER — ACETAMINOPHEN 325 MG PO TABS: 650.0000 mg | ORAL_TABLET | ORAL | Status: DC | PRN

## 2022-03-30 MED ORDER — PANTOPRAZOLE SODIUM 40 MG IV SOLR
40.0000 mg | INTRAVENOUS | Status: DC
  Administered 2022-03-30: 40 mg via INTRAVENOUS
  Filled 2022-03-30: qty 10

## 2022-03-30 MED ORDER — MAGNESIUM SULFATE 2 GM/50ML IV SOLN
INTRAVENOUS | Status: AC
  Administered 2022-03-30: 2 g
  Filled 2022-03-30: qty 50

## 2022-03-30 MED ORDER — METHYLPREDNISOLONE SODIUM SUCC 125 MG IJ SOLR
80.0000 mg | Freq: Two times a day (BID) | INTRAMUSCULAR | Status: DC
  Administered 2022-03-30 – 2022-03-31 (×2): 80 mg via INTRAVENOUS
  Filled 2022-03-30 (×2): qty 2

## 2022-03-30 MED ORDER — SODIUM CHLORIDE 0.9 % IV BOLUS
500.0000 mL | Freq: Once | INTRAVENOUS | Status: AC
  Administered 2022-03-30: 500 mL via INTRAVENOUS

## 2022-03-30 MED ORDER — METHYLPREDNISOLONE SODIUM SUCC 125 MG IJ SOLR
INTRAMUSCULAR | Status: AC
  Administered 2022-03-30: 125 mg via INTRAVENOUS
  Filled 2022-03-30: qty 2

## 2022-03-30 MED ORDER — KETAMINE HCL 50 MG/5ML IJ SOSY
0.5000 mg/kg | PREFILLED_SYRINGE | Freq: Once | INTRAMUSCULAR | Status: AC
  Administered 2022-03-30: 42 mg via INTRAVENOUS
  Filled 2022-03-30: qty 5

## 2022-03-30 MED ORDER — FLUTICASONE FUROATE-VILANTEROL 100-25 MCG/ACT IN AEPB
1.0000 | INHALATION_SPRAY | Freq: Every day | RESPIRATORY_TRACT | Status: DC
  Filled 2022-03-30: qty 28

## 2022-03-30 MED ORDER — ONDANSETRON HCL 4 MG/2ML IJ SOLN: 4.0000 mg | Freq: Four times a day (QID) | INTRAMUSCULAR | Status: DC | PRN

## 2022-03-30 MED ORDER — HEPARIN SODIUM (PORCINE) 5000 UNIT/ML IJ SOLN
5000.0000 [IU] | Freq: Three times a day (TID) | INTRAMUSCULAR | Status: DC
  Administered 2022-03-30: 5000 [IU] via SUBCUTANEOUS
  Filled 2022-03-30: qty 1

## 2022-03-30 MED ORDER — IPRATROPIUM-ALBUTEROL 0.5-2.5 (3) MG/3ML IN SOLN
3.0000 mL | Freq: Four times a day (QID) | RESPIRATORY_TRACT | Status: DC | PRN
  Administered 2022-03-31 (×2): 3 mL via RESPIRATORY_TRACT
  Filled 2022-03-30 (×2): qty 3

## 2022-03-30 MED ORDER — ALBUTEROL (5 MG/ML) CONTINUOUS INHALATION SOLN
15.0000 mg/h | INHALATION_SOLUTION | RESPIRATORY_TRACT | Status: DC
  Administered 2022-03-30: 15 mg/h via RESPIRATORY_TRACT
  Filled 2022-03-30: qty 20

## 2022-03-30 MED ORDER — DOCUSATE SODIUM 100 MG PO CAPS: 100.0000 mg | ORAL_CAPSULE | Freq: Two times a day (BID) | ORAL | Status: DC | PRN

## 2022-03-30 NOTE — ED Notes (Signed)
Provider at bedside when pt bp is 89/76. 500cc bolus NS initiated. Pt remains alert and oriented x 4 with HR ranging from 130-155 bpm. Switched to 2LPM [place] from bipap. Pt tolerating well. RT is aware. Cardiac monitoring in place. Will continue to monitor.

## 2022-03-30 NOTE — ED Notes (Addendum)
MD notified of pt HR. Continues to maintain at 125-140 bpm. Alert and oriented x 4. Denies cp or any discomfort. Pt had multiple breathing treatments earlier. Remains comfortable at this time. Airway intact. Will continue to monitor.
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
## 2022-03-30 NOTE — ED Notes (Signed)
Pt HR ranges from 130-165bpm. Pt is alert and oriented x 4. Reports mid chest pain. Repeat EKG done at this time. MD notified. Waiting for new orders.
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
## 2022-03-30 NOTE — ED Notes (Signed)
Pt with wheezing returning from bathroom - MD notified - per MD okay to give breathing tx at this time

## 2022-03-30 NOTE — ED Notes (Signed)
Pt ambulatory to restroom - does not wish to wait on portable O2 to walk to restroom

## 2022-03-30 NOTE — ED Notes (Signed)
Abnormal Blood tests results reported to admitting MD .
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
## 2022-03-31 ENCOUNTER — Other Ambulatory Visit: Payer: Self-pay

## 2022-03-31 ENCOUNTER — Telehealth: Payer: Self-pay | Admitting: Pulmonary Disease

## 2022-03-31 ENCOUNTER — Inpatient Hospital Stay (HOSPITAL_COMMUNITY): Payer: Self-pay

## 2022-03-31 ENCOUNTER — Encounter (HOSPITAL_COMMUNITY): Payer: Self-pay | Admitting: Pulmonary Disease

## 2022-03-31 ENCOUNTER — Other Ambulatory Visit (HOSPITAL_COMMUNITY): Payer: Self-pay

## 2022-03-31 DIAGNOSIS — J9602 Acute respiratory failure with hypercapnia: Secondary | ICD-10-CM

## 2022-03-31 DIAGNOSIS — J9601 Acute respiratory failure with hypoxia: Secondary | ICD-10-CM

## 2022-03-31 LAB — BASIC METABOLIC PANEL
Anion gap: 9 (ref 5–15)
BUN: 21 mg/dL — ABNORMAL HIGH (ref 6–20)
CO2: 24 mmol/L (ref 22–32)
Calcium: 9.3 mg/dL (ref 8.9–10.3)
Chloride: 106 mmol/L (ref 98–111)
Creatinine, Ser: 1.34 mg/dL — ABNORMAL HIGH (ref 0.61–1.24)
GFR, Estimated: >60 mL/min (ref >60)
Glucose, Bld: 142 mg/dL — ABNORMAL HIGH (ref 70–99)
Potassium: 4.4 mmol/L (ref 3.5–5.1)
Sodium: 139 mmol/L (ref 135–145)

## 2022-03-31 LAB — CBC WITH DIFFERENTIAL/PLATELET
Abs Immature Granulocytes: 0.08 10*3/uL — ABNORMAL HIGH (ref 0.00–0.07)
Basophils Absolute: 0 10*3/uL (ref 0.0–0.1)
Basophils Relative: 0 %
Eosinophils Absolute: 0 10*3/uL (ref 0.0–0.5)
Eosinophils Relative: 0 %
HCT: 40.5 % (ref 39.0–52.0)
Hemoglobin: 14.1 g/dL (ref 13.0–17.0)
Immature Granulocytes: 1 %
Lymphocytes Relative: 6 %
Lymphs Abs: 0.9 10*3/uL (ref 0.7–4.0)
MCH: 31.9 pg (ref 26.0–34.0)
MCHC: 34.8 g/dL (ref 30.0–36.0)
MCV: 91.6 fL (ref 80.0–100.0)
Monocytes Absolute: 0.4 10*3/uL (ref 0.1–1.0)
Monocytes Relative: 2 %
Neutro Abs: 14.3 10*3/uL — ABNORMAL HIGH (ref 1.7–7.7)
Neutrophils Relative %: 91 %
Platelets: 207 10*3/uL (ref 150–400)
RBC: 4.42 MIL/uL (ref 4.22–5.81)
RDW: 13.3 % (ref 11.5–15.5)
WBC: 15.6 10*3/uL — ABNORMAL HIGH (ref 4.0–10.5)
nRBC: 0 % (ref 0.0–0.2)

## 2022-03-31 LAB — LEGIONELLA PNEUMOPHILA SEROGP 1 UR AG: L. pneumophila Serogp 1 Ur Ag: NEGATIVE

## 2022-03-31 MED ORDER — PREDNISONE 5 MG PO TABS: 10.0000 mg | ORAL_TABLET | Freq: Every day | ORAL | Status: DC

## 2022-03-31 MED ORDER — PREDNISONE 10 MG PO TABS
10.0000 mg | ORAL_TABLET | Freq: Every day | ORAL | 0 refills | Status: DC
  Filled 2022-03-31: qty 40, 40d supply, fill #0

## 2022-03-31 MED ORDER — PREDNISONE 10 MG PO TABS
ORAL_TABLET | ORAL | 0 refills | Status: AC
  Filled 2022-03-31: qty 36, 15d supply, fill #0

## 2022-03-31 MED ORDER — IPRATROPIUM BROMIDE 0.02 % IN SOLN: 0.5000 mg | Freq: Three times a day (TID) | RESPIRATORY_TRACT | Status: DC

## 2022-03-31 MED ORDER — PREDNISONE 20 MG PO TABS: 20.0000 mg | ORAL_TABLET | Freq: Every day | ORAL | Status: DC

## 2022-03-31 MED ORDER — PREDNISONE 20 MG PO TABS
40.0000 mg | ORAL_TABLET | Freq: Every day | ORAL | Status: DC
  Administered 2022-03-31: 40 mg via ORAL
  Filled 2022-03-31: qty 2

## 2022-03-31 MED ORDER — PREDNISONE 5 MG PO TABS: 30.0000 mg | ORAL_TABLET | Freq: Every day | ORAL | Status: DC

## 2022-03-31 NOTE — TOC Transition Note (Signed)
Transition of Care [facility]) - CM/SW Discharge Note   Patient Details  Name: Aaron [name] MRN: [id number] Date of Birth: 22-May-1970  [facility] CM/SW Contact:  Harriet Masson, RN Phone Number: 03/31/2022, 12:43 PM   Clinical Narrative:     Patient stable for discharge.  Prescriptions sent to [facility]. Patient follows up [facility] and gets his prescriptions from [facility]. No other TOC needs.   Final next level of care: Home/Self Care Barriers to Discharge: Barriers Resolved   Patient Goals and CMS Choice Patient states their goals for this hospitalization and ongoing recovery are:: return home      Discharge Placement               home        Discharge Plan and Services   home                                   Social Determinants of Health (SDOH) Interventions     Readmission Risk Interventions    03/31/2022   12:42 PM  Readmission Risk Prevention Plan  Post Dischage Appt Complete  Medication Screening Complete  Transportation Screening Complete

## 2022-03-31 NOTE — Telephone Encounter (Signed)
PCCM:  Please schedule follow-up appointment with Dr. Isaiah Serge or APP in the next 2 to 3 weeks.  Aaron Igo, DO [facility] 03/31/2022 11:16 AM

## 2022-03-31 NOTE — Plan of Care (Signed)
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
## 2022-03-31 NOTE — Progress Notes (Signed)
  Transition of Care [facility]) Screening Note   Patient Details  Name: Aaron [name] Date of Birth: 1971/03/14   Transition of Care [facility]) CM/SW Contact:    Harriet Masson, RN Phone Number: 03/31/2022, 8:27 AM    Transition of Care Department [facility]) has reviewed patient and no TOC needs have been identified at this time. We will continue to monitor patient advancement through interdisciplinary progression rounds. If new patient transition needs arise, please place a TOC consult.

## 2022-04-01 NOTE — Telephone Encounter (Signed)
Patient is scheduled with Buelah Manis on 12/15 at 2:30pm- reminder mailed to pt- nothing further needed.

## 2022-04-15 ENCOUNTER — Ambulatory Visit: Payer: Self-pay | Admitting: Primary Care

## 2022-05-06 ENCOUNTER — Telehealth: Payer: Self-pay | Admitting: Primary Care

## 2022-05-06 ENCOUNTER — Other Ambulatory Visit: Payer: Self-pay

## 2022-05-06 ENCOUNTER — Encounter: Payer: Self-pay | Admitting: Primary Care

## 2022-05-06 ENCOUNTER — Ambulatory Visit (INDEPENDENT_AMBULATORY_CARE_PROVIDER_SITE_OTHER): Payer: Self-pay | Admitting: Primary Care

## 2022-05-06 ENCOUNTER — Other Ambulatory Visit (HOSPITAL_COMMUNITY): Payer: Self-pay

## 2022-05-06 VITALS — BP 122/68 | HR 87 | Ht 70.0 in | Wt 180.8 lb

## 2022-05-06 DIAGNOSIS — J4489 Other specified chronic obstructive pulmonary disease: Secondary | ICD-10-CM

## 2022-05-06 DIAGNOSIS — K2100 Gastro-esophageal reflux disease with esophagitis, without bleeding: Secondary | ICD-10-CM

## 2022-05-06 DIAGNOSIS — J441 Chronic obstructive pulmonary disease with (acute) exacerbation: Secondary | ICD-10-CM

## 2022-05-06 DIAGNOSIS — J45909 Unspecified asthma, uncomplicated: Secondary | ICD-10-CM

## 2022-05-06 DIAGNOSIS — J302 Other seasonal allergic rhinitis: Secondary | ICD-10-CM

## 2022-05-06 MED ORDER — TRELEGY ELLIPTA 100-62.5-25 MCG/ACT IN AEPB: 1.0000 | INHALATION_SPRAY | Freq: Every day | RESPIRATORY_TRACT | 0 refills | Status: AC

## 2022-05-06 MED ORDER — MUPIROCIN 2 % EX OINT
1.0000 | TOPICAL_OINTMENT | Freq: Two times a day (BID) | CUTANEOUS | 0 refills | Status: DC
  Filled 2022-05-06: qty 22, 11d supply, fill #0

## 2022-05-06 MED ORDER — TRELEGY ELLIPTA 100-62.5-25 MCG/ACT IN AEPB: 1.0000 | INHALATION_SPRAY | Freq: Every day | RESPIRATORY_TRACT | 0 refills | Status: DC

## 2022-05-06 MED ORDER — MONTELUKAST SODIUM 10 MG PO TABS
10.0000 mg | ORAL_TABLET | Freq: Every day | ORAL | 6 refills | Status: DC
  Filled 2022-05-06: qty 30, 30d supply, fill #0

## 2022-05-06 MED ORDER — OMEPRAZOLE 20 MG PO CPDR
DELAYED_RELEASE_CAPSULE | Freq: Every day | ORAL | 3 refills | Status: DC
  Filled 2022-05-06: qty 30, 30d supply, fill #0

## 2022-05-06 MED ORDER — CETIRIZINE HCL 10 MG PO TABS
10.0000 mg | ORAL_TABLET | Freq: Every day | ORAL | 11 refills | Status: DC
  Filled 2022-05-06: qty 30, 30d supply, fill #0

## 2022-05-06 NOTE — Telephone Encounter (Signed)
Ok I gave him patient assistance form and [facility] prescription to [facility]

## 2022-05-06 NOTE — Patient Instructions (Addendum)
Recommendations: - Resume Trelegy Ellipta 100 mcg-1 puff daily in the morning - Use Albuterol 2 puffs every 4-6 hours as needed for breakthrough shortness of breath - You will need to contact your PCP Dionisio David for refill of muscle relaxer and anxiety medication - Continue to work on smoking cessation efforts   Orders: - Full PFTs in 3 months   Follow-up: - 3 months with Dr. Vaughan Browner

## 2022-05-06 NOTE — Telephone Encounter (Signed)
What medication is covered for COPD Either trelegy or breztri?
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
## 2022-05-06 NOTE — Telephone Encounter (Signed)
Patient does not have an eligible insurance on file for pharmacy benefits at this time. Eligibility check does not pull any pharmacy benefits.

## 2022-05-13 ENCOUNTER — Other Ambulatory Visit: Payer: Self-pay

## 2022-12-19 NOTE — Assessment & Plan Note (Addendum)
-   Hospitalized November/December 2023 for acute exacerbation. Treated with prednisone taper. Doing better since being discharged, He has a occasional cough with clear sputum. Experiences mild dyspnea symptoms with stairs/inclines.  - Resume Trelegy Ellipta 100 mcg-1 puff daily in the morning - Use Albuterol 2 puffs every 4-6 hours as needed for breakthrough shortness of breath - Continue to work on smoking cessation efforts   Orders: - Full PFTs in 3 months   Follow-up: - 3 months with Dr. Isaiah Serge

## 2022-12-22 ENCOUNTER — Emergency Department (HOSPITAL_COMMUNITY)
Admission: EM | Admit: 2022-12-22 | Discharge: 2022-12-23 | Disposition: A | Discharge status: Home / Self Care | Payer: 59 | Attending: Emergency Medicine | Admitting: Emergency Medicine

## 2022-12-22 ENCOUNTER — Emergency Department (HOSPITAL_COMMUNITY): Payer: 59

## 2022-12-22 ENCOUNTER — Other Ambulatory Visit: Payer: Self-pay

## 2022-12-22 ENCOUNTER — Encounter (HOSPITAL_COMMUNITY): Payer: Self-pay

## 2022-12-22 DIAGNOSIS — R0602 Shortness of breath: Secondary | ICD-10-CM | POA: Diagnosis present

## 2022-12-22 DIAGNOSIS — J441 Chronic obstructive pulmonary disease with (acute) exacerbation: Secondary | ICD-10-CM | POA: Insufficient documentation

## 2022-12-22 LAB — CBC WITH DIFFERENTIAL/PLATELET
Abs Immature Granulocytes: 0.02 10*3/uL (ref 0.00–0.07)
Basophils Absolute: 0.1 10*3/uL (ref 0.0–0.1)
Basophils Relative: 1 %
Eosinophils Absolute: 0.3 10*3/uL (ref 0.0–0.5)
Eosinophils Relative: 4 %
HCT: 44.9 % (ref 39.0–52.0)
Hemoglobin: 14.9 g/dL (ref 13.0–17.0)
Immature Granulocytes: 0 %
Lymphocytes Relative: 27 %
Lymphs Abs: 1.9 10*3/uL (ref 0.7–4.0)
MCH: 30 pg (ref 26.0–34.0)
MCHC: 33.2 g/dL (ref 30.0–36.0)
MCV: 90.3 fL (ref 80.0–100.0)
Monocytes Absolute: 0.7 10*3/uL (ref 0.1–1.0)
Monocytes Relative: 10 %
Neutro Abs: 4.2 10*3/uL (ref 1.7–7.7)
Neutrophils Relative %: 58 %
Platelets: 237 10*3/uL (ref 150–400)
RBC: 4.97 MIL/uL (ref 4.22–5.81)
RDW: 13.2 % (ref 11.5–15.5)
WBC: 7.2 10*3/uL (ref 4.0–10.5)
nRBC: 0 % (ref 0.0–0.2)

## 2022-12-22 LAB — BASIC METABOLIC PANEL
Anion gap: 13 (ref 5–15)
BUN: 14 mg/dL (ref 6–20)
CO2: 23 mmol/L (ref 22–32)
Calcium: 9 mg/dL (ref 8.9–10.3)
Chloride: 101 mmol/L (ref 98–111)
Creatinine, Ser: 1.24 mg/dL (ref 0.61–1.24)
GFR, Estimated: >60 mL/min (ref >60)
Glucose, Bld: 113 mg/dL — ABNORMAL HIGH (ref 70–99)
Potassium: 4.1 mmol/L (ref 3.5–5.1)
Sodium: 137 mmol/L (ref 135–145)

## 2022-12-22 LAB — I-STAT VENOUS BLOOD GAS, ED
Acid-Base Excess: 4 mmol/L — ABNORMAL HIGH (ref 0.0–2.0)
Bicarbonate: 29.1 mmol/L — ABNORMAL HIGH (ref 20.0–28.0)
Calcium, Ion: 1.1 mmol/L — ABNORMAL LOW (ref 1.15–1.40)
HCT: 45 % (ref 39.0–52.0)
Hemoglobin: 15.3 g/dL (ref 13.0–17.0)
O2 Saturation: 96 %
Potassium: 4.2 mmol/L (ref 3.5–5.1)
Sodium: 138 mmol/L (ref 135–145)
TCO2: 31 mmol/L (ref 22–32)
pCO2, Ven: 46.3 mmHg (ref 44–60)
pH, Ven: 7.407 (ref 7.25–7.43)
pO2, Ven: 81 mmHg — ABNORMAL HIGH (ref 32–45)

## 2022-12-22 MED ORDER — ALBUTEROL SULFATE (2.5 MG/3ML) 0.083% IN NEBU
15.0000 mg | INHALATION_SOLUTION | Freq: Once | RESPIRATORY_TRACT | Status: AC
  Administered 2022-12-22: 15 mg via RESPIRATORY_TRACT
  Filled 2022-12-22: qty 18

## 2022-12-22 MED ORDER — ALBUTEROL SULFATE (2.5 MG/3ML) 0.083% IN NEBU
5.0000 mg | INHALATION_SOLUTION | Freq: Once | RESPIRATORY_TRACT | Status: DC
  Filled 2022-12-22: qty 6

## 2022-12-22 MED ORDER — IPRATROPIUM-ALBUTEROL 0.5-2.5 (3) MG/3ML IN SOLN
3.0000 mL | Freq: Once | RESPIRATORY_TRACT | Status: AC
  Administered 2022-12-22: 3 mL via RESPIRATORY_TRACT
  Filled 2022-12-22: qty 3

## 2022-12-22 NOTE — ED Provider Notes (Signed)
  Provider Note MRN:  [id number]  Arrival date & time: 12/23/22    ED Course and Medical Decision Making  Assumed care from Dr. Anitra Lauth at shift change.  COPD exacerbation quickly put on BiPAP on arrival.  May not have necessarily definitely needed BiPAP.  He quickly improved and was quickly taken off BiPAP and he seems to be doing much better.  He wants to go home.  He does not have significant increased work of breathing, his lung sounds are reasonable, still with occasional wheeze.  Plan is for ambulation with pulse ox.  12 AM update: Patient ambulated without issue, no hypoxia, appropriate for discharge.  Procedures  Final Clinical Impressions(s) / ED Diagnoses     ICD-10-CM   1. COPD exacerbation (HCC)  J44.1       ED Discharge Orders          Ordered    predniSONE (DELTASONE) 20 MG tablet  Daily        12/23/22 0005              Discharge Instructions      You were evaluated in the Emergency Department and after careful evaluation, we did not find any emergent condition requiring admission or further testing in the hospital.  Your exam/testing today is overall reassuring.  Symptoms likely due to a COPD exacerbation.  Take the prednisone medication as directed and continue your inhalers at home, follow-up with your regular doctor.  Please return to the Emergency Department if you experience any worsening of your condition.   Thank you for allowing [place] to be a part of your care.      Elmer Sow. Pilar Plate, MD [facility] Emergency Medicine [facility] [email or web address]    Sabas Sous, MD 12/23/22 [phone]

## 2022-12-22 NOTE — ED Notes (Signed)
Pt states that his breathing feels better on the BiPap

## 2022-12-22 NOTE — ED Notes (Incomplete)
Ambulated patient on room air. He states he feels

## 2022-12-22 NOTE — ED Provider Notes (Signed)
[name]   CSN: [id number] Arrival date & time: 12/22/22  2100     History  Chief Complaint  Patient presents with   Shortness of Breath    Aaron [name] is a 52 y.o. male.  Patient is a 52 year old male with history of COPD, ongoing tobacco abuse who is presenting today with worsening shortness of breath over the last 3 to 4 days.  Patient has had increased wheezing, chest tightness with breathing and cough.  No fever, sputum production, swelling in his legs.  He has been using his inhalers and nebulizers more frequently at home with no improvement.  Symptoms worsened tonight and called 911.  When EMS arrived patient was in respiratory distress required 10 mg of albuterol 0.5 mg of Atrovent, Solu-Medrol and 2 g of magnesium.  Patient was still having shortness of breath and was placed on BiPAP upon arrival here.  He already reports he is feeling better.  The history is provided by the patient.  Shortness of Breath      Home Medications Prior to Admission medications   Medication Sig Start Date End Date Taking? Authorizing Provider  albuterol (PROVENTIL) (2.5 MG/3ML) 0.083% nebulizer solution TAKE 3 MLS (2.5 MG TOTAL) BY NEBULIZATION 3 (THREE) TIMES DAILY. 11/23/20   Sherryll Burger, Pratik D, DO  albuterol (VENTOLIN HFA) 108 (90 Base) MCG/ACT inhaler INHALE 2 PUFFS INTO THE LUNGS EVERY 4 (FOUR) HOURS AS NEEDED FOR WHEEZING OR SHORTNESS OF BREATH. 11/23/20 03/30/22  Sherryll Burger, Pratik D, DO  busPIRone (BUSPAR) 15 MG tablet Take 1 tablet (15 mg total) by mouth 2 (two) times daily. Patient not taking: Reported on 03/30/2022 05/06/19   Kallie Locks, [name]  cetirizine (ZYRTEC) 10 MG tablet Take 1 tablet (10 mg total) by mouth daily. 05/06/22   Glenford Bayley, NP  Fluticasone-Umeclidin-Vilant (TRELEGY ELLIPTA) 100-62.5-25 MCG/ACT AEPB Inhale 1 puff into the lungs daily. 05/06/22   Glenford Bayley, NP  Fluticasone-Umeclidin-Vilant (TRELEGY  ELLIPTA) 100-62.5-25 MCG/INH AEPB Inhale 1 puff into the lungs daily. Patient not taking: Reported on 03/30/2022 11/23/20   Maurilio Lovely D, DO  montelukast (SINGULAIR) 10 MG tablet Take 1 tablet (10 mg total) by mouth at bedtime. 05/06/22 12/02/22  Glenford Bayley, NP  mupirocin ointment (BACTROBAN) 2 % Apply 1 Application topically 2 (two) times daily. Apply to right nostril twice daily 3-5 days 05/06/22   Glenford Bayley, NP  omeprazole (PRILOSEC) 20 MG capsule TAKE 1 CAPSULE (20 MG TOTAL) BY MOUTH DAILY. 05/06/22 05/06/23  Glenford Bayley, NP      Allergies    Keflex [cephalexin]    Review of Systems   Review of Systems  Respiratory:  Positive for shortness of breath.     Physical Exam Updated Vital Signs BP (!) 146/97   Pulse (!) 106   Temp 98.2 F (36.8 C) (Oral)   Resp 19   SpO2 99%  Physical Exam Vitals and nursing note reviewed.  Constitutional:      General: He is in acute distress.     Appearance: He is well-developed.  HENT:     Head: Normocephalic and atraumatic.  Eyes:     Conjunctiva/sclera: Conjunctivae normal.     Pupils: Pupils are equal, round, and reactive to light.  Cardiovascular:     Rate and Rhythm: Normal rate and regular rhythm.     Heart sounds: No murmur heard. Pulmonary:     Effort: Tachypnea and accessory muscle usage present.  No respiratory distress.     Breath sounds: Wheezing present. No rales.  Abdominal:     General: There is no distension.     Palpations: Abdomen is soft.     Tenderness: There is no abdominal tenderness. There is no guarding or rebound.  Musculoskeletal:        General: No tenderness. Normal range of motion.     Cervical back: Normal range of motion and neck supple.     Right lower leg: No edema.     Left lower leg: No edema.  Skin:    General: Skin is warm and dry.     Findings: No erythema or rash.  Neurological:     Mental Status: He is alert and oriented to person, place, and time.  Psychiatric:         Behavior: Behavior normal.     ED Results / Procedures / Treatments   Labs (all labs ordered are listed, but only abnormal results are displayed) Labs Reviewed  BASIC METABOLIC PANEL - Abnormal; Notable for the following components:      Result Value   Glucose, Bld 113 (*)    All other components within normal limits  I-STAT VENOUS BLOOD GAS, ED - Abnormal; Notable for the following components:   pO2, Ven 81 (*)    Bicarbonate 29.1 (*)    Acid-Base Excess 4.0 (*)    Calcium, Ion 1.10 (*)    All other components within normal limits  CBC WITH DIFFERENTIAL/PLATELET    EKG EKG Interpretation Date/Time:  Thursday December 22 2022 21:20:04 EDT Ventricular Rate:  104 PR Interval:  144 QRS Duration:  94 QT Interval:  319 QTC Calculation: 420 R Axis:   86  Text Interpretation: Sinus tachycardia Anteroseptal infarct, old Confirmed by Gwyneth Sprout ([id number]) on 12/22/2022 9:54:18 PM  Radiology DG Chest Port 1 View  Result Date: 12/22/2022 CLINICAL DATA:  Shortness of breath EXAM: PORTABLE CHEST 1 VIEW COMPARISON:  Radiographs 03/31/2022 FINDINGS: Stable cardiomediastinal silhouette. No focal consolidation, pleural effusion, or pneumothorax. No displaced rib fractures. IMPRESSION: No acute cardiopulmonary disease. Electronically Signed   By: Minerva Fester M.D.   On: 12/22/2022 23:18    Procedures Procedures    Medications Ordered in ED Medications  albuterol (PROVENTIL) (2.5 MG/3ML) 0.083% nebulizer solution 5 mg (5 mg Nebulization Not Given 12/22/22 2128)  ipratropium-albuterol (DUONEB) 0.5-2.5 (3) MG/3ML nebulizer solution 3 mL (has no administration in time range)  albuterol (PROVENTIL) (2.5 MG/3ML) 0.083% nebulizer solution 15 mg (15 mg Nebulization Given 12/22/22 2128)    ED Course/ Medical Decision Making/ A&P                                 Medical Decision Making Amount and/or Complexity of Data Reviewed Independent Historian: EMS External Data Reviewed:  notes. Labs: ordered. Decision-making details documented in ED Course. Radiology: ordered and independent interpretation performed. Decision-making details documented in ED Course. ECG/medicine tests: ordered and independent interpretation performed. Decision-making details documented in ED Course.  Risk Prescription drug management.   Pt with multiple medical problems and comorbidities and presenting today with a complaint that caries a high risk for morbidity and mortality.  Here today with worsening shortness of breath.  Patient has diffuse wheezing on exam and findings concerning for COPD exacerbation.  Lower suspicion for ACS, PE, infectious etiology.  No history of cardiac disease and no findings suspicious for CHF exacerbation today.  Patient received  numerous medications prior to arrival.  At this time we will continue BiPAP and do an hour-long nebulizer and reevaluate.  I independently interpreted patient's EKG and labs.  VBG today with normal pH and CO2 without evidence of hypercarbia, CBC, BMP within normal limits.  EKG with sinus tachycardia but no other acute findings.  11:22 PM I have independently visualized and interpreted pt's images today.  Chest x-ray within normal limits.  On repeat evaluation after the hour-long continuous neb and BiPAP patient's wheezing is almost completely gone except for some residual wheezing on the right side.  Talking with the patient he wants to try discontinuing the BiPAP and was hopeful that he would be able to go home.  Will observe off BiPAP to see if patient could truly go home versus admission for ongoing treatments.        Final Clinical Impression(s) / ED Diagnoses Final diagnoses:  COPD exacerbation [facility])    Rx / DC Orders ED Discharge Orders     None         Gwyneth Sprout, MD 12/22/22 2322

## 2022-12-22 NOTE — ED Notes (Signed)
Pt off bipap, currently 92 on room air

## 2022-12-22 NOTE — Progress Notes (Signed)
Placed patient on bipap dur to increased respiratory distress

## 2022-12-22 NOTE — ED Triage Notes (Signed)
Pt with hx of COPD comes in with exacerbation.  Worsening since last week, required more medications.    10 mg albuterol  0.5 of atrovent 125 solumedrol 2G of mag 20 LAC on ambulance

## 2022-12-23 ENCOUNTER — Other Ambulatory Visit: Payer: Self-pay

## 2022-12-23 MED ORDER — PREDNISONE 20 MG PO TABS
40.0000 mg | ORAL_TABLET | Freq: Every day | ORAL | 0 refills | Status: DC
  Filled 2022-12-23 – 2023-01-03 (×2): qty 8, 4d supply, fill #0

## 2022-12-23 NOTE — ED Notes (Signed)
Ambulated patient. O2 sats 91-93% on room air. Patient said he felt okay.

## 2022-12-23 NOTE — Discharge Instructions (Signed)
You were evaluated in the Emergency Department and after careful evaluation, we did not find any emergent condition requiring admission or further testing in the hospital.  Your exam/testing today is overall reassuring.  Symptoms likely due to a COPD exacerbation.  Take the prednisone medication as directed and continue your inhalers at home, follow-up with your regular doctor.  Please return to the Emergency Department if you experience any worsening of your condition.   Thank you for allowing [place] to be a part of your care.

## 2022-12-30 ENCOUNTER — Other Ambulatory Visit: Payer: Self-pay

## 2023-01-03 ENCOUNTER — Encounter (HOSPITAL_COMMUNITY): Payer: Self-pay

## 2023-01-03 ENCOUNTER — Emergency Department (HOSPITAL_COMMUNITY): Payer: 59

## 2023-01-03 ENCOUNTER — Emergency Department (HOSPITAL_COMMUNITY)
Admission: EM | Admit: 2023-01-03 | Discharge: 2023-01-03 | Disposition: A | Discharge status: Home / Self Care | Payer: 59 | Attending: Emergency Medicine | Admitting: Emergency Medicine

## 2023-01-03 ENCOUNTER — Other Ambulatory Visit: Payer: Self-pay | Admitting: Nurse Practitioner

## 2023-01-03 ENCOUNTER — Other Ambulatory Visit: Payer: Self-pay

## 2023-01-03 DIAGNOSIS — Z7952 Long term (current) use of systemic steroids: Secondary | ICD-10-CM | POA: Diagnosis not present

## 2023-01-03 DIAGNOSIS — Z20822 Contact with and (suspected) exposure to covid-19: Secondary | ICD-10-CM | POA: Diagnosis not present

## 2023-01-03 DIAGNOSIS — J45909 Unspecified asthma, uncomplicated: Secondary | ICD-10-CM | POA: Diagnosis not present

## 2023-01-03 DIAGNOSIS — J441 Chronic obstructive pulmonary disease with (acute) exacerbation: Secondary | ICD-10-CM | POA: Insufficient documentation

## 2023-01-03 DIAGNOSIS — R000 Tachycardia, unspecified: Secondary | ICD-10-CM | POA: Insufficient documentation

## 2023-01-03 DIAGNOSIS — Z7951 Long term (current) use of inhaled steroids: Secondary | ICD-10-CM | POA: Diagnosis not present

## 2023-01-03 DIAGNOSIS — J4530 Mild persistent asthma, uncomplicated: Secondary | ICD-10-CM

## 2023-01-03 DIAGNOSIS — R0602 Shortness of breath: Secondary | ICD-10-CM | POA: Diagnosis present

## 2023-01-03 LAB — CBC WITH DIFFERENTIAL/PLATELET
Abs Immature Granulocytes: 0.02 10*3/uL (ref 0.00–0.07)
Basophils Absolute: 0.1 10*3/uL (ref 0.0–0.1)
Basophils Relative: 2 %
Eosinophils Absolute: 0.2 10*3/uL (ref 0.0–0.5)
Eosinophils Relative: 3 %
HCT: 46.2 % (ref 39.0–52.0)
Hemoglobin: 15.1 g/dL (ref 13.0–17.0)
Immature Granulocytes: 0 %
Lymphocytes Relative: 22 %
Lymphs Abs: 1.6 10*3/uL (ref 0.7–4.0)
MCH: 30.1 pg (ref 26.0–34.0)
MCHC: 32.7 g/dL (ref 30.0–36.0)
MCV: 92 fL (ref 80.0–100.0)
Monocytes Absolute: 0.8 10*3/uL (ref 0.1–1.0)
Monocytes Relative: 11 %
Neutro Abs: 4.5 10*3/uL (ref 1.7–7.7)
Neutrophils Relative %: 62 %
Platelets: 226 10*3/uL (ref 150–400)
RBC: 5.02 MIL/uL (ref 4.22–5.81)
RDW: 13.1 % (ref 11.5–15.5)
WBC: 7.2 10*3/uL (ref 4.0–10.5)
nRBC: 0 % (ref 0.0–0.2)

## 2023-01-03 LAB — COMPREHENSIVE METABOLIC PANEL
ALT: 24 U/L (ref 0–44)
AST: 27 U/L (ref 15–41)
Albumin: 4.4 g/dL (ref 3.5–5.0)
Alkaline Phosphatase: 88 U/L (ref 38–126)
Anion gap: 15 (ref 5–15)
BUN: 17 mg/dL (ref 6–20)
CO2: 24 mmol/L (ref 22–32)
Calcium: 9.1 mg/dL (ref 8.9–10.3)
Chloride: 102 mmol/L (ref 98–111)
Creatinine, Ser: 1.19 mg/dL (ref 0.61–1.24)
GFR, Estimated: >60 mL/min (ref >60)
Glucose, Bld: 110 mg/dL — ABNORMAL HIGH (ref 70–99)
Potassium: 3.9 mmol/L (ref 3.5–5.1)
Sodium: 141 mmol/L (ref 135–145)
Total Bilirubin: 1 mg/dL (ref 0.3–1.2)
Total Protein: 7.4 g/dL (ref 6.5–8.1)

## 2023-01-03 LAB — TROPONIN I (HIGH SENSITIVITY)
Troponin I (High Sensitivity): 4 ng/L (ref <18)
Troponin I (High Sensitivity): 4 ng/L (ref <18)

## 2023-01-03 LAB — RESP PANEL BY RT-PCR (RSV, FLU A&B, COVID)  RVPGX2
Influenza A by PCR: NEGATIVE
Influenza B by PCR: NEGATIVE
Resp Syncytial Virus by PCR: NEGATIVE
SARS Coronavirus 2 by RT PCR: NEGATIVE

## 2023-01-03 LAB — I-STAT VENOUS BLOOD GAS, ED
Acid-Base Excess: 2 mmol/L (ref 0.0–2.0)
Bicarbonate: 28.4 mmol/L — ABNORMAL HIGH (ref 20.0–28.0)
Calcium, Ion: 1.13 mmol/L — ABNORMAL LOW (ref 1.15–1.40)
HCT: 46 % (ref 39.0–52.0)
Hemoglobin: 15.6 g/dL (ref 13.0–17.0)
O2 Saturation: 97 %
Potassium: 3.9 mmol/L (ref 3.5–5.1)
Sodium: 141 mmol/L (ref 135–145)
TCO2: 30 mmol/L (ref 22–32)
pCO2, Ven: 49.3 mmHg (ref 44–60)
pH, Ven: 7.368 (ref 7.25–7.43)
pO2, Ven: 91 mmHg — ABNORMAL HIGH (ref 32–45)

## 2023-01-03 MED ORDER — DOXYCYCLINE HYCLATE 100 MG PO CAPS: 100.0000 mg | ORAL_CAPSULE | Freq: Two times a day (BID) | ORAL | 0 refills | Status: AC

## 2023-01-03 MED ORDER — DOXYCYCLINE HYCLATE 100 MG PO CAPS
100.0000 mg | ORAL_CAPSULE | Freq: Two times a day (BID) | ORAL | 0 refills | Status: DC
  Filled 2023-01-03: qty 10, 5d supply, fill #0

## 2023-01-03 MED ORDER — PREDNISONE 20 MG PO TABS
40.0000 mg | ORAL_TABLET | Freq: Every day | ORAL | 0 refills | Status: DC
  Filled 2023-01-03: qty 10, 5d supply, fill #0

## 2023-01-03 MED ORDER — ALBUTEROL SULFATE (2.5 MG/3ML) 0.083% IN NEBU
2.5000 mg | INHALATION_SOLUTION | Freq: Once | RESPIRATORY_TRACT | Status: AC
  Administered 2023-01-03: 2.5 mg via RESPIRATORY_TRACT
  Filled 2023-01-03: qty 3

## 2023-01-03 MED ORDER — METHYLPREDNISOLONE SODIUM SUCC 125 MG IJ SOLR
125.0000 mg | Freq: Once | INTRAMUSCULAR | Status: DC
  Filled 2023-01-03: qty 2

## 2023-01-03 MED ORDER — PREDNISONE 20 MG PO TABS: 40.0000 mg | ORAL_TABLET | Freq: Every day | ORAL | 0 refills | Status: AC

## 2023-01-03 MED ORDER — IPRATROPIUM-ALBUTEROL 0.5-2.5 (3) MG/3ML IN SOLN
3.0000 mL | RESPIRATORY_TRACT | Status: AC
  Administered 2023-01-03 (×3): 3 mL via RESPIRATORY_TRACT
  Filled 2023-01-03 (×2): qty 3

## 2023-01-03 NOTE — ED Provider Notes (Signed)
[place] EMERGENCY DEPARTMENT AT [facility] Provider Note   CSN: [id number] Arrival date & time: 01/03/23  1701     History  Chief Complaint  Patient presents with   Respiratory Distress    Aaron [name] is a 52 y.o. male.  HPI 52 year old male history of COPD/asthma, GERD, anxiety present for shortness of breath.  States earlier today he developed gradually onset worsening shortness of breath and wheezing.  He also had some mild chest tightness no frank pain.  He has a nonproductive cough, no fevers or chills.  No vomiting.  Feels like prior COPD exacerbations.  He was seen by EMS and placed on BiPAP for work of breathing.  He feels better on BiPAP.  No known sick contacts.     Home Medications Prior to Admission medications   Medication Sig Start Date End Date Taking? Authorizing Provider  albuterol (PROVENTIL) (2.5 MG/3ML) 0.083% nebulizer solution TAKE 3 MLS (2.5 MG TOTAL) BY NEBULIZATION 3 (THREE) TIMES DAILY. 11/23/20   Sherryll Burger, Pratik D, DO  albuterol (VENTOLIN HFA) 108 (90 Base) MCG/ACT inhaler INHALE 2 PUFFS INTO THE LUNGS EVERY 4 (FOUR) HOURS AS NEEDED FOR WHEEZING OR SHORTNESS OF BREATH. 11/23/20 03/30/22  Sherryll Burger, Pratik D, DO  busPIRone (BUSPAR) 15 MG tablet Take 1 tablet (15 mg total) by mouth 2 (two) times daily. Patient not taking: Reported on 03/30/2022 05/06/19   Kallie Locks, [name]  cetirizine (ZYRTEC) 10 MG tablet Take 1 tablet (10 mg total) by mouth daily. 05/06/22   Glenford Bayley, NP  doxycycline (VIBRAMYCIN) 100 MG capsule Take 1 capsule (100 mg total) by mouth 2 (two) times daily for 5 days. 01/03/23 01/08/23  Laurence Spates, MD  Fluticasone-Umeclidin-Vilant (TRELEGY ELLIPTA) 100-62.5-25 MCG/ACT AEPB Inhale 1 puff into the lungs daily. 05/06/22   Glenford Bayley, NP  Fluticasone-Umeclidin-Vilant (TRELEGY ELLIPTA) 100-62.5-25 MCG/INH AEPB Inhale 1 puff into the lungs daily. Patient not taking: Reported on 03/30/2022 11/23/20   Maurilio Lovely D, DO   montelukast (SINGULAIR) 10 MG tablet Take 1 tablet (10 mg total) by mouth at bedtime. 05/06/22 12/02/22  Glenford Bayley, NP  mupirocin ointment (BACTROBAN) 2 % Apply 1 Application topically 2 (two) times daily. Apply to right nostril twice daily 3-5 days 05/06/22   Glenford Bayley, NP  omeprazole (PRILOSEC) 20 MG capsule TAKE 1 CAPSULE (20 MG TOTAL) BY MOUTH DAILY. 05/06/22 05/06/23  Glenford Bayley, NP  predniSONE (DELTASONE) 20 MG tablet Take 2 tablets (40 mg total) by mouth daily for 5 days. 01/03/23 01/08/23  Laurence Spates, MD      Allergies    Keflex [cephalexin]    Review of Systems   Review of Systems Review of systems completed and notable as per HPI.  ROS otherwise negative.   Physical Exam Updated Vital Signs BP 118/80   Pulse (!) 101   Temp 98 F (36.7 C) (Oral)   Resp 19   Ht 5\' 10"  (1.778 m)   Wt 86.2 kg   SpO2 94%   BMI 27.26 kg/m  Physical Exam Vitals and nursing note reviewed.  Constitutional:      General: He is not in acute distress.    Appearance: He is well-developed.  HENT:     Head: Normocephalic and atraumatic.     Nose: Nose normal.     Mouth/Throat:     Mouth: Mucous membranes are moist.     Pharynx: Oropharynx is clear.  Eyes:     Extraocular Movements:  Extraocular movements intact.     Conjunctiva/sclera: Conjunctivae normal.     Pupils: Pupils are equal, round, and reactive to light.  Cardiovascular:     Rate and Rhythm: Normal rate and regular rhythm.     Heart sounds: No murmur heard. Pulmonary:     Effort: Respiratory distress present.     Breath sounds: Wheezing present.  Abdominal:     Palpations: Abdomen is soft.     Tenderness: There is no abdominal tenderness. There is no guarding or rebound.  Musculoskeletal:        General: No swelling.     Cervical back: Neck supple.     Right lower leg: No edema.     Left lower leg: No edema.  Skin:    General: Skin is warm and dry.     Capillary Refill: Capillary refill takes less  than 2 seconds.  Neurological:     General: No focal deficit present.     Mental Status: He is alert and oriented to person, place, and time. Mental status is at baseline.     Cranial Nerves: No cranial nerve deficit.     Motor: No weakness.  Psychiatric:        Mood and Affect: Mood normal.     ED Results / Procedures / Treatments   Labs (all labs ordered are listed, but only abnormal results are displayed) Labs Reviewed  COMPREHENSIVE METABOLIC PANEL - Abnormal; Notable for the following components:      Result Value   Glucose, Bld 110 (*)    All other components within normal limits  I-STAT VENOUS BLOOD GAS, ED - Abnormal; Notable for the following components:   pO2, Ven 91 (*)    Bicarbonate 28.4 (*)    Calcium, Ion 1.13 (*)    All other components within normal limits  RESP PANEL BY RT-PCR (RSV, FLU A&B, COVID)  RVPGX2  CBC WITH DIFFERENTIAL/PLATELET  TROPONIN I (HIGH SENSITIVITY)  TROPONIN I (HIGH SENSITIVITY)    EKG EKG Interpretation Date/Time:  Tuesday January 03 2023 17:13:37 EDT Ventricular Rate:  110 PR Interval:  136 QRS Duration:  88 QT Interval:  320 QTC Calculation: 433 R Axis:   97  Text Interpretation: Sinus tachycardia Anterior infarct, old Confirmed by Fulton Reek [phone]) on 01/03/2023 5:20:28 PM  Radiology DG Chest 1 View  Result Date: 01/03/2023 CLINICAL DATA:  Shortness of breath. EXAM: CHEST  1 VIEW COMPARISON:  12/22/2022 FINDINGS: The cardiomediastinal contours are normal. Mild chronic hyperinflation. Pulmonary vasculature is normal. No consolidation, pleural effusion, or pneumothorax. No acute osseous abnormalities are seen. IMPRESSION: Mild chronic hyperinflation. No acute chest finding. Electronically Signed   By: Narda Rutherford M.D.   On: 01/03/2023 19:51    Procedures Procedures    Medications Ordered in ED Medications  ipratropium-albuterol (DUONEB) 0.5-2.5 (3) MG/3ML nebulizer solution 3 mL (3 mLs Nebulization Given 01/03/23  1800)  albuterol (PROVENTIL) (2.5 MG/3ML) 0.083% nebulizer solution 2.5 mg (2.5 mg Nebulization Given 01/03/23 1952)    ED Course/ Medical Decision Making/ A&P                                 Medical Decision Making Amount and/or Complexity of Data Reviewed Labs: ordered. Radiology: ordered.  Risk Prescription drug management.   Medical Decision Making:   Zayon GERSON ARMELIN is a 52 y.o. male who presented to the ED today with shortness of breath.  Vital signs notable  for mild tachypnea and tachycardia.  My exam he is awake and alert although endorsed short of breath and has significant wheezing and is somewhat tight bilaterally.  He is on BiPAP.  He reports of chest tightness but no frank pain.  EKG shows sinus tachycardia, no acute ischemic changes.  Appears to be most likely COPD exacerbation.  Will treat for this.  His wheezing and presentation is less consistent with PE, especially no pleuritic pain or DVT symptoms.   Patient placed on continuous vitals and telemetry monitoring while in ED which was reviewed periodically.  Reviewed and confirmed nursing documentation for past medical history, family history, social history.  Reassessment and Plan:   On reassessment, patient has improvement in breathing.  He is transition off of BiPAP to room air.  Blood work is reassuring, troponin negative x 2 with reassuring EKG, low concern for ACS but showed no chest pain.  Chest x-ray without pneumonia pneumothorax or any other acute findings, CBC, CMP are unremarkable.  Blood gas without hypercapnia.  COVID is negative.  He was able to be weaned to room air, states stable.  I gave him additional butyryl treatment.  He is already received steroid with EMS.  He clarified that he requested to go on BiPAP which is why EMS put him on it.  He never required it for work of breathing or hypoxia.  He would like to go home.  I did discuss with him that he has significant COPD exacerbation.  I discussed that he  could potentially get worse if he were to go home but he was clearly he would like to try to manage this outpatient.  Given that saw clearly he actually needed BiPAP think this is reasonable given he is on room air with clear breath sounds, normal work of breathing.  He has mild tachycardia but I think it is due to albuterol.  Will give a prescription for steroids, doxycycline.  He already has inhaler at home that he can use.  Discharge strict return precautions recommend PCP follow-up.   Patient's presentation is most consistent with acute presentation with potential threat to life or bodily function.           Final Clinical Impression(s) / ED Diagnoses Final diagnoses:  COPD exacerbation (HCC)    Rx / DC Orders ED Discharge Orders          Ordered    predniSONE (DELTASONE) 20 MG tablet  Daily,   Status:  Discontinued        01/03/23 2159    doxycycline (VIBRAMYCIN) 100 MG capsule  2 times daily,   Status:  Discontinued        01/03/23 2159    doxycycline (VIBRAMYCIN) 100 MG capsule  2 times daily        01/03/23 2200    predniSONE (DELTASONE) 20 MG tablet  Daily        01/03/23 2200              Laurence Spates, MD 01/03/23 2226

## 2023-01-03 NOTE — ED Triage Notes (Signed)
Pt bib [facility] coming form home w/ c/o SOB.  SOB started about an hour prior to EMS arrival. Pt has expiratory wheezing and diaphoresis. EMS tried a duoneb but pt symptoms worsening. EMS states RA SpO2 -93%. EMS gave 2 duoneb, reg albuterol neb, 125 solumedrol, 2g mag. Pt has hx of asthma and COPD. Pt a&ox4.  EMS vital signs: 170/90 106 HR 100% CPAP  20LAC

## 2023-01-03 NOTE — Discharge Instructions (Signed)
I recommend you use your albuterol inhaler as well as the prescribed antibiotics and steroids.  If you develop worsening difficulty breathing not responding to albuterol you should return to the ED.  You should follow-up with your primary care doctor.

## 2023-01-04 ENCOUNTER — Other Ambulatory Visit: Payer: Self-pay

## 2023-01-05 ENCOUNTER — Other Ambulatory Visit: Payer: Self-pay

## 2023-01-10 ENCOUNTER — Other Ambulatory Visit: Payer: Self-pay

## 2023-03-06 ENCOUNTER — Other Ambulatory Visit: Payer: Self-pay | Admitting: *Deleted

## 2023-03-07 ENCOUNTER — Ambulatory Visit (INDEPENDENT_AMBULATORY_CARE_PROVIDER_SITE_OTHER): Payer: 59 | Admitting: Pulmonary Disease

## 2023-03-07 DIAGNOSIS — J441 Chronic obstructive pulmonary disease with (acute) exacerbation: Secondary | ICD-10-CM

## 2023-03-07 LAB — PULMONARY FUNCTION TEST
DL/VA % pred: 67 %
DL/VA: 3.01 ml/min/mmHg/L
DLCO cor % pred: 57 %
DLCO cor: 16.14 ml/min/mmHg
DLCO unc % pred: 57 %
DLCO unc: 16.14 ml/min/mmHg
FEF 25-75 Post: 0.75 L/s
FEF 25-75 Pre: 0.46 L/s
FEF2575-%Change-Post: 63 %
FEF2575-%Pred-Post: 22 %
FEF2575-%Pred-Pre: 13 %
FEV1-%Change-Post: 24 %
FEV1-%Pred-Post: 32 %
FEV1-%Pred-Pre: 25 %
FEV1-Post: 1.22 L
FEV1-Pre: 0.98 L
FEV1FVC-%Change-Post: 5 %
FEV1FVC-%Pred-Pre: 62 %
FEV6-%Change-Post: 17 %
FEV6-%Pred-Post: 50 %
FEV6-%Pred-Pre: 42 %
FEV6-Post: 2.37 L
FEV6-Pre: 2.01 L
FEV6FVC-%Change-Post: 0 %
FEV6FVC-%Pred-Post: 103 %
FEV6FVC-%Pred-Pre: 103 %
FVC-%Change-Post: 17 %
FVC-%Pred-Post: 48 %
FVC-%Pred-Pre: 41 %
FVC-Post: 2.38 L
FVC-Pre: 2.02 L
Post FEV1/FVC ratio: 51 %
Post FEV6/FVC ratio: 100 %
Pre FEV1/FVC ratio: 49 %
Pre FEV6/FVC Ratio: 100 %
RV % pred: 166 %
RV: 3.34 L
TLC % pred: 90 %
TLC: 6.12 L

## 2023-03-07 NOTE — Patient Instructions (Signed)
Full PFT performed today. °

## 2023-03-07 NOTE — Progress Notes (Signed)
Full PFT performed today. °

## 2023-03-13 ENCOUNTER — Ambulatory Visit: Payer: Self-pay | Admitting: Pulmonary Disease

## 2023-03-13 ENCOUNTER — Encounter: Payer: Self-pay | Admitting: Pulmonary Disease

## 2023-03-22 NOTE — Progress Notes (Signed)
Pulmonary function testing showed severe obstructive lung disease with moderate diffusion defect.  Patient needs a follow-up with Dr. Isaiah Serge first available (3 month fu)

## 2023-06-05 ENCOUNTER — Ambulatory Visit: Payer: Managed Care, Other (non HMO) | Admitting: Pulmonary Disease

## 2023-06-05 ENCOUNTER — Encounter: Payer: Self-pay | Admitting: Pulmonary Disease

## 2023-06-08 ENCOUNTER — Inpatient Hospital Stay (HOSPITAL_COMMUNITY)
Admission: EM | Admit: 2023-06-08 | Discharge: 2023-06-09 | DRG: 190 | Disposition: A | Discharge status: Home / Self Care | Payer: Self-pay | Attending: Internal Medicine | Admitting: Internal Medicine

## 2023-06-08 ENCOUNTER — Encounter (HOSPITAL_COMMUNITY): Payer: Self-pay | Admitting: Internal Medicine

## 2023-06-08 ENCOUNTER — Other Ambulatory Visit: Payer: Self-pay

## 2023-06-08 ENCOUNTER — Emergency Department (HOSPITAL_COMMUNITY): Payer: Self-pay

## 2023-06-08 ENCOUNTER — Telehealth: Payer: Self-pay | Admitting: Pulmonary Disease

## 2023-06-08 DIAGNOSIS — Z8249 Family history of ischemic heart disease and other diseases of the circulatory system: Secondary | ICD-10-CM

## 2023-06-08 DIAGNOSIS — Z720 Tobacco use: Secondary | ICD-10-CM | POA: Diagnosis present

## 2023-06-08 DIAGNOSIS — J9601 Acute respiratory failure with hypoxia: Secondary | ICD-10-CM | POA: Diagnosis present

## 2023-06-08 DIAGNOSIS — J9602 Acute respiratory failure with hypercapnia: Secondary | ICD-10-CM | POA: Diagnosis present

## 2023-06-08 DIAGNOSIS — F17210 Nicotine dependence, cigarettes, uncomplicated: Secondary | ICD-10-CM | POA: Diagnosis present

## 2023-06-08 DIAGNOSIS — K219 Gastro-esophageal reflux disease without esophagitis: Secondary | ICD-10-CM | POA: Diagnosis present

## 2023-06-08 DIAGNOSIS — Z79899 Other long term (current) drug therapy: Secondary | ICD-10-CM

## 2023-06-08 DIAGNOSIS — J45901 Unspecified asthma with (acute) exacerbation: Secondary | ICD-10-CM

## 2023-06-08 DIAGNOSIS — Z7951 Long term (current) use of inhaled steroids: Secondary | ICD-10-CM

## 2023-06-08 DIAGNOSIS — J101 Influenza due to other identified influenza virus with other respiratory manifestations: Secondary | ICD-10-CM | POA: Diagnosis present

## 2023-06-08 DIAGNOSIS — F419 Anxiety disorder, unspecified: Secondary | ICD-10-CM | POA: Diagnosis present

## 2023-06-08 DIAGNOSIS — J441 Chronic obstructive pulmonary disease with (acute) exacerbation: Principal | ICD-10-CM | POA: Diagnosis present

## 2023-06-08 DIAGNOSIS — Z833 Family history of diabetes mellitus: Secondary | ICD-10-CM

## 2023-06-08 DIAGNOSIS — Z881 Allergy status to other antibiotic agents status: Secondary | ICD-10-CM

## 2023-06-08 DIAGNOSIS — J440 Chronic obstructive pulmonary disease with acute lower respiratory infection: Secondary | ICD-10-CM | POA: Diagnosis present

## 2023-06-08 LAB — I-STAT ARTERIAL BLOOD GAS, ED
Acid-base deficit: 1 mmol/L (ref 0.0–2.0)
Bicarbonate: 26.7 mmol/L (ref 20.0–28.0)
Calcium, Ion: 1.26 mmol/L (ref 1.15–1.40)
HCT: 42 % (ref 39.0–52.0)
Hemoglobin: 14.3 g/dL (ref 13.0–17.0)
O2 Saturation: 100 %
Potassium: 3.8 mmol/L (ref 3.5–5.1)
Sodium: 138 mmol/L (ref 135–145)
TCO2: 28 mmol/L (ref 22–32)
pCO2 arterial: 52.9 mm[Hg] — ABNORMAL HIGH (ref 32–48)
pH, Arterial: 7.311 — ABNORMAL LOW (ref 7.35–7.45)
pO2, Arterial: 543 mm[Hg] — ABNORMAL HIGH (ref 83–108)

## 2023-06-08 LAB — HIV ANTIBODY (ROUTINE TESTING W REFLEX): HIV Screen 4th Generation wRfx: NONREACTIVE

## 2023-06-08 LAB — CBC
HCT: 47.1 % (ref 39.0–52.0)
Hemoglobin: 14.7 g/dL (ref 13.0–17.0)
MCH: 30.2 pg (ref 26.0–34.0)
MCHC: 31.2 g/dL (ref 30.0–36.0)
MCV: 96.9 fL (ref 80.0–100.0)
Platelets: 221 10*3/uL (ref 150–400)
RBC: 4.86 MIL/uL (ref 4.22–5.81)
RDW: 13.4 % (ref 11.5–15.5)
WBC: 8 10*3/uL (ref 4.0–10.5)
nRBC: 0 % (ref 0.0–0.2)

## 2023-06-08 LAB — RESPIRATORY PANEL BY PCR

## 2023-06-08 LAB — COMPREHENSIVE METABOLIC PANEL
ALT: 21 U/L (ref 0–44)
AST: 26 U/L (ref 15–41)
Albumin: 4.4 g/dL (ref 3.5–5.0)
Alkaline Phosphatase: 88 U/L (ref 38–126)
Anion gap: 16 — ABNORMAL HIGH (ref 5–15)
BUN: 17 mg/dL (ref 6–20)
CO2: 20 mmol/L — ABNORMAL LOW (ref 22–32)
Calcium: 8.9 mg/dL (ref 8.9–10.3)
Chloride: 103 mmol/L (ref 98–111)
Creatinine, Ser: 1.36 mg/dL — ABNORMAL HIGH (ref 0.61–1.24)
GFR, Estimated: >60 mL/min (ref >60)
Glucose, Bld: 196 mg/dL — ABNORMAL HIGH (ref 70–99)
Potassium: 3.5 mmol/L (ref 3.5–5.1)
Sodium: 139 mmol/L (ref 135–145)
Total Bilirubin: 0.7 mg/dL (ref 0.0–1.2)
Total Protein: 7.1 g/dL (ref 6.5–8.1)

## 2023-06-08 LAB — RESP PANEL BY RT-PCR (RSV, FLU A&B, COVID)  RVPGX2
Influenza A by PCR: POSITIVE — AB
Influenza B by PCR: NEGATIVE
Resp Syncytial Virus by PCR: NEGATIVE
SARS Coronavirus 2 by RT PCR: NEGATIVE

## 2023-06-08 LAB — TROPONIN I (HIGH SENSITIVITY)
Troponin I (High Sensitivity): 5 ng/L (ref <18)
Troponin I (High Sensitivity): 16 ng/L (ref <18)

## 2023-06-08 LAB — BRAIN NATRIURETIC PEPTIDE: B Natriuretic Peptide: 70.4 pg/mL (ref 0.0–100.0)

## 2023-06-08 LAB — PROCALCITONIN: Procalcitonin: 0.21 ng/mL

## 2023-06-08 MED ORDER — BUSPIRONE HCL 10 MG PO TABS: 15.0000 mg | ORAL_TABLET | Freq: Two times a day (BID) | ORAL | Status: DC | PRN

## 2023-06-08 MED ORDER — ONDANSETRON HCL 4 MG/2ML IJ SOLN
4.0000 mg | Freq: Four times a day (QID) | INTRAMUSCULAR | Status: DC | PRN
Start: 2023-06-08 — End: 2023-06-09

## 2023-06-08 MED ORDER — OSELTAMIVIR PHOSPHATE 75 MG PO CAPS
75.0000 mg | ORAL_CAPSULE | Freq: Two times a day (BID) | ORAL | Status: DC
  Administered 2023-06-08 – 2023-06-09 (×3): 75 mg via ORAL
  Filled 2023-06-08 (×3): qty 1

## 2023-06-08 MED ORDER — LORATADINE 10 MG PO TABS
10.0000 mg | ORAL_TABLET | Freq: Every day | ORAL | Status: DC
  Administered 2023-06-08 – 2023-06-09 (×2): 10 mg via ORAL
  Filled 2023-06-08 (×2): qty 1

## 2023-06-08 MED ORDER — ACETAMINOPHEN 650 MG RE SUPP: 650.0000 mg | Freq: Four times a day (QID) | RECTAL | Status: DC | PRN

## 2023-06-08 MED ORDER — NICOTINE 14 MG/24HR TD PT24
14.0000 mg | MEDICATED_PATCH | Freq: Every day | TRANSDERMAL | Status: DC
  Administered 2023-06-08 – 2023-06-09 (×2): 14 mg via TRANSDERMAL
  Filled 2023-06-08 (×2): qty 1

## 2023-06-08 MED ORDER — PREDNISONE 20 MG PO TABS: 40.0000 mg | ORAL_TABLET | Freq: Every day | ORAL | Status: DC

## 2023-06-08 MED ORDER — MONTELUKAST SODIUM 10 MG PO TABS: 10.0000 mg | ORAL_TABLET | Freq: Every day | ORAL | Status: DC

## 2023-06-08 MED ORDER — ENOXAPARIN SODIUM 40 MG/0.4ML IJ SOSY: 40.0000 mg | PREFILLED_SYRINGE | INTRAMUSCULAR | Status: DC

## 2023-06-08 MED ORDER — ALBUTEROL SULFATE (2.5 MG/3ML) 0.083% IN NEBU: 2.5000 mg | INHALATION_SOLUTION | RESPIRATORY_TRACT | Status: DC | PRN

## 2023-06-08 MED ORDER — BUDESONIDE 0.25 MG/2ML IN SUSP
0.2500 mg | Freq: Two times a day (BID) | RESPIRATORY_TRACT | Status: DC
  Administered 2023-06-08 – 2023-06-09 (×3): 0.25 mg via RESPIRATORY_TRACT
  Filled 2023-06-08 (×3): qty 2

## 2023-06-08 MED ORDER — DIPHENHYDRAMINE HCL 25 MG PO CAPS: 25.0000 mg | ORAL_CAPSULE | Freq: Every day | ORAL | Status: DC | PRN

## 2023-06-08 MED ORDER — GUAIFENESIN ER 600 MG PO TB12
600.0000 mg | ORAL_TABLET | Freq: Two times a day (BID) | ORAL | Status: DC
  Administered 2023-06-08 – 2023-06-09 (×3): 600 mg via ORAL
  Filled 2023-06-08 (×3): qty 1

## 2023-06-08 MED ORDER — METHYLPREDNISOLONE SODIUM SUCC 40 MG IJ SOLR
40.0000 mg | Freq: Two times a day (BID) | INTRAMUSCULAR | Status: AC
  Administered 2023-06-08 – 2023-06-09 (×2): 40 mg via INTRAVENOUS
  Filled 2023-06-08 (×2): qty 1

## 2023-06-08 MED ORDER — ONDANSETRON HCL 4 MG PO TABS: 4.0000 mg | ORAL_TABLET | Freq: Four times a day (QID) | ORAL | Status: DC | PRN

## 2023-06-08 MED ORDER — ALBUTEROL SULFATE (2.5 MG/3ML) 0.083% IN NEBU
2.5000 mg | INHALATION_SOLUTION | Freq: Four times a day (QID) | RESPIRATORY_TRACT | Status: DC
  Administered 2023-06-08 – 2023-06-09 (×2): 2.5 mg via RESPIRATORY_TRACT
  Filled 2023-06-08 (×4): qty 3

## 2023-06-08 MED ORDER — ARFORMOTEROL TARTRATE 15 MCG/2ML IN NEBU
15.0000 ug | INHALATION_SOLUTION | Freq: Two times a day (BID) | RESPIRATORY_TRACT | Status: DC
  Administered 2023-06-08 – 2023-06-09 (×3): 15 ug via RESPIRATORY_TRACT
  Filled 2023-06-08 (×3): qty 2

## 2023-06-08 MED ORDER — SODIUM CHLORIDE 0.9% FLUSH
3.0000 mL | Freq: Two times a day (BID) | INTRAVENOUS | Status: DC
  Administered 2023-06-08 – 2023-06-09 (×3): 3 mL via INTRAVENOUS

## 2023-06-08 MED ORDER — PANTOPRAZOLE SODIUM 40 MG PO TBEC
40.0000 mg | DELAYED_RELEASE_TABLET | Freq: Every day | ORAL | Status: DC
  Administered 2023-06-08 – 2023-06-09 (×2): 40 mg via ORAL
  Filled 2023-06-08 (×2): qty 1

## 2023-06-08 MED ORDER — ACETAMINOPHEN 325 MG PO TABS
650.0000 mg | ORAL_TABLET | Freq: Four times a day (QID) | ORAL | Status: DC | PRN
  Administered 2023-06-08 – 2023-06-09 (×2): 650 mg via ORAL
  Filled 2023-06-08 (×2): qty 2

## 2023-06-08 MED ORDER — ENOXAPARIN SODIUM 40 MG/0.4ML IJ SOSY
40.0000 mg | PREFILLED_SYRINGE | INTRAMUSCULAR | Status: DC
  Administered 2023-06-08: 40 mg via SUBCUTANEOUS
  Filled 2023-06-08: qty 0.4

## 2023-06-08 MED ORDER — REVEFENACIN 175 MCG/3ML IN SOLN
175.0000 ug | Freq: Every day | RESPIRATORY_TRACT | Status: DC
  Administered 2023-06-08 – 2023-06-09 (×2): 175 ug via RESPIRATORY_TRACT
  Filled 2023-06-08 (×2): qty 3

## 2023-06-08 MED ORDER — ALBUTEROL SULFATE (2.5 MG/3ML) 0.083% IN NEBU
INHALATION_SOLUTION | RESPIRATORY_TRACT | Status: AC
  Administered 2023-06-08: 2.5 mg via RESPIRATORY_TRACT
  Filled 2023-06-08: qty 12

## 2023-06-08 MED ORDER — ALBUTEROL SULFATE (2.5 MG/3ML) 0.083% IN NEBU
10.0000 mg/h | INHALATION_SOLUTION | Freq: Once | RESPIRATORY_TRACT | Status: AC
  Administered 2023-06-08: 10 mg/h via RESPIRATORY_TRACT

## 2023-06-08 NOTE — ED Provider Notes (Signed)
 [name] EMERGENCY DE[facility]MENT AT [facility] Provider Note   CSN: [id number] Arrival date & time: 06/08/23  9061     History  Chief complaint: Shortness of breath or respiratory distress  Aaron [name] is a 53 y.o. male.  HPI   Patient has a history of asthma COPD.  Patient has required admission to the hospital and also has been intubated in the past for breathing difficulties.  Patient has been evaluated by neurology in the past and his pulmonary function test shows severe obstructive lung disease.  Patient states last night he started having severe shortness of breath.  He had to call EMS.  EMS noted to be in distress with severe respiratory difficulty.  He was wheezing.  He was given 10 mg of albuterol  as well as Atrovent .  He was given Solu-Medrol  as well as magnesium  sulfate  Home Medications Prior to Admission medications   Medication Sig Start Date End Date Taking? Authorizing Provider  albuterol  (PROVENTIL ) (2.5 MG/3ML) 0.083% nebulizer solution TAKE 3 MLS (2.5 MG TOTAL) BY NEBULIZATION 3 (THREE) TIMES DAILY. 11/23/20   Maree, Pratik D, DO  albuterol  (VENTOLIN  HFA) 108 (90 Base) MCG/ACT inhaler INHALE 2 PUFFS INTO THE LUNGS EVERY 4 (FOUR) HOURS AS NEEDED FOR WHEEZING OR SHORTNESS OF BREATH. 11/23/20 03/30/22  Maree, Pratik D, DO  busPIRone  (BUSPAR ) 15 MG tablet Take 1 tablet (15 mg total) by mouth 2 (two) times daily. Patient not taking: Reported on 03/30/2022 05/06/19   Stroud, [name], [name]  cetirizine  (ZYRTEC ) 10 MG tablet Take 1 tablet (10 mg total) by mouth daily. 05/06/22   Hope Almarie ORN, NP  Fluticasone -Umeclidin-Vilant (TRELEGY ELLIPTA ) 100-62.5-25 MCG/ACT AEPB Inhale 1 puff into the lungs daily. 05/06/22   Hope Almarie ORN, NP  Fluticasone -Umeclidin-Vilant (TRELEGY ELLIPTA ) 100-62.5-25 MCG/INH AEPB Inhale 1 puff into the lungs daily. Patient not taking: Reported on 03/30/2022 11/23/20   Maree Bracken D, DO  montelukast  (SINGULAIR ) 10 MG tablet Take 1 tablet (10  mg total) by mouth at bedtime. 05/06/22 12/02/22  Hope Almarie ORN, NP  mupirocin  ointment (BACTROBAN ) 2 % Apply 1 Application topically 2 (two) times daily. Apply to right nostril twice daily 3-5 days 05/06/22   Hope Almarie ORN, NP  omeprazole  (PRILOSEC) 20 MG capsule TAKE 1 CAPSULE (20 MG TOTAL) BY MOUTH DAILY. 05/06/22 05/06/23  Hope Almarie ORN, NP      Allergies    Keflex  [cephalexin ]    Review of Systems   Review of Systems  Physical Exam Updated Vital Signs BP 114/68   Pulse (!) 107   Resp (!) 22   SpO2 100%  Physical Exam Vitals and nursing note reviewed.  Constitutional:      General: He is in acute distress.     Appearance: He is well-developed. He is ill-appearing and diaphoretic.  HENT:     Head: Normocephalic and atraumatic.     Right Ear: External ear normal.     Left Ear: External ear normal.  Eyes:     General: No scleral icterus.       Right eye: No discharge.        Left eye: No discharge.     Conjunctiva/sclera: Conjunctivae normal.  Neck:     Trachea: No tracheal deviation.  Cardiovascular:     Rate and Rhythm: Regular rhythm. Tachycardia present.  Pulmonary:     Effort: Tachypnea, accessory muscle usage, prolonged expiration, respiratory distress and retractions present.     Breath sounds: No stridor. Wheezing present. No  rales.  Abdominal:     General: Bowel sounds are normal. There is no distension.     Palpations: Abdomen is soft.     Tenderness: There is no abdominal tenderness. There is no guarding or rebound.  Musculoskeletal:        General: No tenderness or deformity.     Cervical back: Neck supple.  Skin:    General: Skin is warm.     Findings: No rash.  Neurological:     General: No focal deficit present.     Mental Status: He is alert.     Cranial Nerves: No cranial nerve deficit, dysarthria or facial asymmetry.     Sensory: No sensory deficit.     Motor: No abnormal muscle tone or seizure activity.     Coordination: Coordination  normal.  Psychiatric:        Mood and Affect: Mood normal.     ED Results / Procedures / Treatments   Labs (all labs ordered are listed, but only abnormal results are displayed) Labs Reviewed  COMPREHENSIVE METABOLIC PANEL - Abnormal; Notable for the following components:      Result Value   CO2 20 (*)    Glucose, Bld 196 (*)    Creatinine, Ser 1.36 (*)    Anion gap 16 (*)    All other components within normal limits  I-STAT ARTERIAL BLOOD GAS, ED - Abnormal; Notable for the following components:   pH, Arterial 7.311 (*)    pCO2 arterial 52.9 (*)    pO2, Arterial 543 (*)    All other components within normal limits  RESP PANEL BY RT-PCR (RSV, FLU A&B, COVID)  RVPGX2  RESPIRATORY PANEL BY PCR  CBC  BRAIN NATRIURETIC PEPTIDE  TROPONIN I (HIGH SENSITIVITY)  TROPONIN I (HIGH SENSITIVITY)    EKG EKG Interpretation Date/Time:  Thursday June 08 2023 10:03:53 EST Ventricular Rate:  121 PR Interval:  140 QRS Duration:  87 QT Interval:  285 QTC Calculation: 405 R Axis:   99  Text Interpretation: Sinus tachycardia Multiple ventricular premature complexes Consider right atrial enlargement Anterior infarct, old Artifact in lead(s) II III aVF V4 V5 Confirmed by Randol Simmonds [phone]) on 06/08/2023 11:05:18 AM  Radiology DG Chest Port 1 View Result Date: 06/08/2023 CLINICAL DATA:  53 year old male with respiratory distress at 0400 hours. EXAM: PORTABLE CHEST 1 VIEW COMPARISON:  Portable chest 01/03/2023 and earlier. FINDINGS: Portable AP upright view at 1002 hours. Lung volumes and mediastinal contours are stable and within normal limits. Visualized tracheal air column is within normal limits. Allowing for portable technique the lungs are clear. No pneumothorax or pleural effusion. No acute osseous abnormality identified. IMPRESSION: Negative portable chest. Electronically Signed   By: VEAR Hurst M.D.   On: 06/08/2023 10:30    Procedures .Critical Care  Performed by: Randol Simmonds,  MD Authorized by: Randol Simmonds, MD   Critical care provider statement:    Critical care time (minutes):  45   Critical care was time spent personally by me on the following activities:  Development of treatment plan with patient or surrogate, discussions with consultants, evaluation of patient's response to treatment, examination of patient, ordering and review of laboratory studies, ordering and review of radiographic studies, ordering and performing treatments and interventions, pulse oximetry, re-evaluation of patient's condition and review of old charts     Medications Ordered in ED Medications  albuterol  (PROVENTIL ) (2.5 MG/3ML) 0.083% nebulizer solution (  Canceled Entry 06/08/23 1005)  arformoterol  (BROVANA ) nebulizer solution 15  mcg (has no administration in time range)  revefenacin  (YUPELRI ) nebulizer solution 175 mcg (has no administration in time range)  budesonide  (PULMICORT ) nebulizer solution 0.25 mg (has no administration in time range)  albuterol  (PROVENTIL ) (2.5 MG/3ML) 0.083% nebulizer solution (10 mg/hr Nebulization Given 06/08/23 1005)    ED Course/ Medical Decision Making/ A&P Clinical Course as of 06/09/23 0630  Thu Jun 08, 2023  0950 Patient noted to be in severe distress on arrival.  BiPAP initiated.  With reassurance and coaching patient is currently tolerating BiPAP.  He does note some improvement in his work of breathing.  Blood pressure noted to be decreasing [JK]  1004 Patient still labored but nods that he feels like he is improving somewhat.  Will continue to monitor very closely continue trial of BiPAP. [JK]  1104 Patient continues to appear more comfortable.  Breathing more easily [JK]  1113 Blood gas shows decreased pH and elevated pCO2.  CBC normal [JK]  1114 Chest x-ray without acute findings [JK]  1127 CBC normal.  Metabolic panel shows increased creatinine [JK]  1151 Case reviewed with Dr. Claudene pulmonary critical care.  He will see the patient in  consultation to see if he should be admitted to the ICU [JK]  1229 Patient significantly better.  Seen by pulmonary critical care.  He is able to wean off BiPAP at this time. [JK]  1230 Patient concerned about admitted to the hospital.  He is a syndicate care of at home.  Explained to him that ideally we watch him overnight.  He will try to see if he can make arrangements at home [JK]  1302 Case discussed with Dr. Claudene regarding admission [JK]    Clinical Course User Index [JK] Randol Simmonds, MD                                 Medical Decision Making Patient presents to the ED in acute respiratory distress.  Pneumonia pneumothorax COPD exacerbation CHF acute consideration  Problems Addressed: COPD with acute exacerbation (HCC): acute illness or injury that poses a threat to life or bodily functions  Amount and/or Complexity of Data Reviewed Labs: ordered. Decision-making details documented in ED Course. Radiology: ordered and independent interpretation performed.  Risk Prescription drug management. Drug therapy requiring intensive monitoring for toxicity. Decision regarding hospitalization.   Patient presented to the ED for acute respiratory distress.  Patient noted to have significant wheezing, diaphoresis and distress on presentation.  Patient received multiple treatments by EMS without significant improvement.  On arrival to the ED he appeared in severe distress and it appeared he might require intubation.  Trial of BiPAP was initiated.  Patient required some coaching and support but he was able to maintain the BiPAP mask.  Patient has improved significantly.  He is breathing more easily at this time.  ABG does show a component of respiratory acidosis.  No significant abnormalities on his CBC.  Metabolic panel does show slightly elevated creatinine and glucose.  Chest x-ray does not show any acute abnormality.  Patient was given an additional breathing treatment.  Continue to monitor  closely.  Patient does not appear that he requires intubation at this time and does seem to be responding to BiPAP.  He will still require close monitoring.  I will consult pulmonary critical care to discuss possible admission to the ICU  Pt evaluated by PCCM.  Pt has improved in the interim.  Appropriate for  hospitalist admission        Final Clinical Impression(s) / ED Diagnoses Final diagnoses:  COPD with acute exacerbation [facility])    Rx / DC Orders ED Discharge Orders     None         Randol Simmonds, MD 06/09/23 [phone]
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
## 2023-06-08 NOTE — ED Triage Notes (Addendum)
 Pt BIB [facility] for Asthma induced respiratory distress beginning around 4 am this morning.  PT tried using his inhaler with minimal relief.   PT presents extremely labored, with wheezing and minimal breath sounds.    EMS gave 10 Albuterol , 1 mg Atrovent , 125 Solumedrol, and 2mg  Magnesium  Sulfate.
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
## 2023-06-08 NOTE — Progress Notes (Signed)
 PT. Not ready to be placed on Bipap at this time. Resting comfortably on 4L [place]. RT encouraged to call nurse to notify RT if he has any trouble with starting/placing mask on face when ready to use.

## 2023-06-09 ENCOUNTER — Other Ambulatory Visit: Payer: Self-pay

## 2023-06-09 LAB — BASIC METABOLIC PANEL
Anion gap: 14 (ref 5–15)
BUN: 22 mg/dL — ABNORMAL HIGH (ref 6–20)
CO2: 20 mmol/L — ABNORMAL LOW (ref 22–32)
Calcium: 9.3 mg/dL (ref 8.9–10.3)
Chloride: 104 mmol/L (ref 98–111)
Creatinine, Ser: 1.16 mg/dL (ref 0.61–1.24)
GFR, Estimated: >60 mL/min (ref >60)
Glucose, Bld: 125 mg/dL — ABNORMAL HIGH (ref 70–99)
Potassium: 4.3 mmol/L (ref 3.5–5.1)
Sodium: 138 mmol/L (ref 135–145)

## 2023-06-09 LAB — CBC
HCT: 47.2 % (ref 39.0–52.0)
Hemoglobin: 15 g/dL (ref 13.0–17.0)
MCH: 30.6 pg (ref 26.0–34.0)
MCHC: 31.8 g/dL (ref 30.0–36.0)
MCV: 96.3 fL (ref 80.0–100.0)
Platelets: 180 10*3/uL (ref 150–400)
RBC: 4.9 MIL/uL (ref 4.22–5.81)
RDW: 13.5 % (ref 11.5–15.5)
WBC: 8.1 10*3/uL (ref 4.0–10.5)
nRBC: 0 % (ref 0.0–0.2)

## 2023-06-09 MED ORDER — GUAIFENESIN 100 MG/5ML PO LIQD
5.0000 mL | Freq: Four times a day (QID) | ORAL | Status: DC | PRN
  Administered 2023-06-09: 5 mL via ORAL
  Filled 2023-06-09: qty 10

## 2023-06-09 MED ORDER — OSELTAMIVIR PHOSPHATE 75 MG PO CAPS
75.0000 mg | ORAL_CAPSULE | Freq: Two times a day (BID) | ORAL | 0 refills | Status: AC
  Filled 2023-06-09: qty 8, 4d supply, fill #0

## 2023-06-09 MED ORDER — GUAIFENESIN ER 600 MG PO TB12
600.0000 mg | ORAL_TABLET | Freq: Two times a day (BID) | ORAL | 0 refills | Status: AC
  Filled 2023-06-09: qty 28, 14d supply, fill #0

## 2023-06-09 MED ORDER — PREDNISONE 10 MG PO TABS
ORAL_TABLET | ORAL | 0 refills | Status: AC
  Filled 2023-06-09: qty 30, 12d supply, fill #0

## 2023-06-09 NOTE — Discharge Summary (Signed)
 Physician Discharge Summary  Aaron [name] FMW:[id number] DOB: [birth year] DOA: 06/08/2023  PCP: Myrna Camelia HERO, NP  Admit date: 06/08/2023 Discharge date: 06/09/2023  Admitted From: Home Disposition: Home with  Recommendations for Outpatient Follow-up:  Follow up with PCP in 1-2 weeks Please obtain BMP/CBC in one week Follow-up with pulmonary  Home Health: N/A Equipment/Devices: N/A  Discharge Condition: Stable CODE STATUS: Full code Diet recommendation: Regular diet  Discharge summary: 53 year old with history of COPD, asthma, ongoing smoker presented with sudden onset of shortness of breath, congestion.  He does have a history of advanced COPD.  Previously intubated due to breathing issues so he was scared and came to the hospital.  In the route by EMS he was given DuoNebs, Solu-Medrol  and magnesium  sulfate with already improved symptoms.  Upon admission to the emergency room he was tachycardic with heart rate 117, respiratory 31.  Blood pressures 195/148.  He himself asked for BiPAP to be applied with respiratory distress.  Blood gas was fairly stable.  Chest x-ray with no acute findings.  Due to initial respiratory distress, also seen by ICU.  Patient also tested positive for influenza A H1 2009.  Patient was admitted to the hospital.  Treated aggressively with steroids, albuterol  inhalers, steroid inhalers and started on Tamiflu .  He was still in the emergency room.  Patient is currently on room air.  Symptoms controlled.  Mobilizing around.  Requested to be discharged and felt well. Plan: Discharging with prednisone  taper for 12 days. Completed Tamiflu  for 5 days. Patient has adequate nebulizer and inhalers at home.  He is also on a steroid inhalers.  Advised to continue Trelegy. Resume all home medications.      Discharge Diagnoses:  Principal Problem:   COPD exacerbation (HCC) Active Problems:   Acute respiratory failure with hypoxia (HCC)   Influenza A   Anxiety   GERD  (gastroesophageal reflux disease)   Tobacco abuse (since age 74- > 68 Pack Years)    Discharge Instructions  Discharge Instructions     Diet general   Complete by: As directed    Increase activity slowly   Complete by: As directed       Allergies as of 06/09/2023       Reactions   Keflex  [cephalexin ] Diarrhea, Nausea And Vomiting, Rash   hallucinations        Medication List     TAKE these medications    albuterol  108 (90 Base) MCG/ACT inhaler Commonly known as: VENTOLIN  HFA INHALE 2 PUFFS INTO THE LUNGS EVERY 4 (FOUR) HOURS AS NEEDED FOR WHEEZING OR SHORTNESS OF BREATH. What changed: Another medication with the same name was changed. Make sure you understand how and when to take each.   albuterol  (2.5 MG/3ML) 0.083% nebulizer solution Commonly known as: PROVENTIL  TAKE 3 MLS (2.5 MG TOTAL) BY NEBULIZATION 3 (THREE) TIMES DAILY. What changed:  when to take this reasons to take this   busPIRone  15 MG tablet Commonly known as: BUSPAR  Take 1 tablet (15 mg total) by mouth 2 (two) times daily. What changed:  when to take this reasons to take this   cholecalciferol  25 MCG (1000 UNIT) tablet Commonly known as: VITAMIN D3 Take 1,000 Units by mouth every other day.   diphenhydrAMINE  25 mg capsule Commonly known as: BENADRYL  Take 25 mg by mouth daily as needed for allergies.   guaiFENesin  600 MG 12 hr tablet Commonly known as: MUCINEX  Take 1 tablet (600 mg total) by mouth 2 (two) times daily for  14 days.   montelukast  10 MG tablet Commonly known as: Singulair  Take 1 tablet (10 mg total) by mouth at bedtime.   mupirocin  ointment 2 % Commonly known as: BACTROBAN  Apply 1 Application topically 2 (two) times daily. Apply to right nostril twice daily 3-5 days   omeprazole  20 MG capsule Commonly known as: PRILOSEC TAKE 1 CAPSULE (20 MG TOTAL) BY MOUTH DAILY. What changed: how much to take   oseltamivir  75 MG capsule Commonly known as: TAMIFLU  Take 1 capsule (75  mg total) by mouth 2 (two) times daily for 4 days.   predniSONE  10 MG tablet Commonly known as: DELTASONE  Take 4 tablets (40 mg total) by mouth daily with breakfast for 3 days, THEN 3 tablets (30 mg total) daily with breakfast for 3 days, THEN 2 tablets (20 mg total) daily with breakfast for 3 days, THEN 1 tablet (10 mg total) daily with breakfast for 3 days. Start taking on: June 10, 2023   Trelegy Ellipta  100-62.5-25 MCG/ACT Aepb Generic drug: Fluticasone -Umeclidin-Vilant Inhale 1 puff into the lungs daily.        Allergies  Allergen Reactions   Keflex  [Cephalexin ] Diarrhea, Nausea And Vomiting and Rash    hallucinations    Consultations: Pulmonary   Procedures/Studies: DG Chest Port 1 View Result Date: 06/08/2023 CLINICAL DATA:  53 year old male with respiratory distress at 0400 hours. EXAM: PORTABLE CHEST 1 VIEW COMPARISON:  Portable chest 01/03/2023 and earlier. FINDINGS: Portable AP upright view at 1002 hours. Lung volumes and mediastinal contours are stable and within normal limits. Visualized tracheal air column is within normal limits. Allowing for portable technique the lungs are clear. No pneumothorax or pleural effusion. No acute osseous abnormality identified. IMPRESSION: Negative portable chest. Electronically Signed   By: VEAR Hurst M.D.   On: 06/08/2023 10:30   (Echo, Carotid, EGD, Colonoscopy, ERCP)    Subjective: Patient seen in the morning rounds.  He was in the emergency room.  Patient told me that he has minor cough but otherwise feels better.  Patient asked me to be discharged.  We immobilized him in the hallway he was slightly short of breath but maintaining oxygen  saturations and eager to go home.  Afebrile overnight.   Discharge Exam: Vitals:   06/09/23 1019 06/09/23 1029  BP:    Pulse:    Resp:    Temp: 98.7 F (37.1 C)   SpO2:  91%   Vitals:   06/09/23 0625 06/09/23 0945 06/09/23 1019 06/09/23 1029  BP: 128/86 (!) 131/96    Pulse: (!) 110 (!)  104    Resp: 17 20    Temp: 99.6 F (37.6 C)  98.7 F (37.1 C)   TempSrc: Oral  Oral   SpO2: 94% 98%  91%  Weight:      Height:        General: Pt is alert, awake, not in acute distress Mild distress on walking in the hallway.  Cardiovascular: RRR, S1/S2 +, no rubs, no gallops Respiratory: CTA bilaterally, no wheezing, no rhonchi. No added sounds.  Abdominal: Soft, NT, ND, bowel sounds + Extremities: no edema, no cyanosis    The results of significant diagnostics from this hospitalization (including imaging, microbiology, ancillary and laboratory) are listed below for reference.     Microbiology: Recent Results (from the past 240 hours)  Resp panel by RT-PCR (RSV, Flu A&B, Covid) Anterior Nasal Swab     Status: Abnormal   Collection Time: 06/08/23  9:50 AM   Specimen: Anterior Nasal Swab  Result Value Ref Range Status   SARS Coronavirus 2 by RT PCR NEGATIVE NEGATIVE Final   Influenza A by PCR POSITIVE (A) NEGATIVE Final   Influenza B by PCR NEGATIVE NEGATIVE Final    Comment: (NOTE) The Xpert Xpress SARS-CoV-2/FLU/RSV plus assay is intended as an aid in the diagnosis of influenza from Nasopharyngeal swab specimens and should not be used as a sole basis for treatment. Nasal washings and aspirates are unacceptable for Xpert Xpress SARS-CoV-2/FLU/RSV testing.  Fact Sheet for Patients: bloggercourse.com  Fact Sheet for Healthcare Providers: seriousbroker.it  This test is not yet approved or cleared by the [place]  FDA and has been authorized for detection and/or diagnosis of SARS-CoV-2 by FDA under an Emergency Use Authorization (EUA). This EUA will remain in effect (meaning this test can be used) for the duration of the COVID-19 declaration under Section 564(b)(1) of the Act, 21 U.S.C. section 360bbb-3(b)(1), unless the authorization is terminated or revoked.     Resp Syncytial Virus by PCR NEGATIVE NEGATIVE  Final    Comment: (NOTE) Fact Sheet for Patients: bloggercourse.com  Fact Sheet for Healthcare Providers: seriousbroker.it  This test is not yet approved or cleared by the [place]  FDA and has been authorized for detection and/or diagnosis of SARS-CoV-2 by FDA under an Emergency Use Authorization (EUA). This EUA will remain in effect (meaning this test can be used) for the duration of the COVID-19 declaration under Section 564(b)(1) of the Act, 21 U.S.C. section 360bbb-3(b)(1), unless the authorization is terminated or revoked.  Performed at [facility], [address], [place], [place] [address]   Respiratory (~20 pathogens) panel by PCR     Status: Abnormal   Collection Time: 06/08/23  9:50 AM   Specimen: Anterior Nasal Swab; Respiratory  Result Value Ref Range Status   Adenovirus NOT DETECTED NOT DETECTED Final   Coronavirus 229E NOT DETECTED NOT DETECTED Final    Comment: (NOTE) The Coronavirus on the Respiratory Panel, DOES NOT test for the novel  Coronavirus (2019 nCoV)    Coronavirus HKU1 NOT DETECTED NOT DETECTED Final   Coronavirus NL63 NOT DETECTED NOT DETECTED Final   Coronavirus OC43 NOT DETECTED NOT DETECTED Final   Metapneumovirus NOT DETECTED NOT DETECTED Final   Rhinovirus / Enterovirus NOT DETECTED NOT DETECTED Final   Influenza A H1 2009 DETECTED (A) NOT DETECTED Final   Influenza B NOT DETECTED NOT DETECTED Final   Parainfluenza Virus 1 NOT DETECTED NOT DETECTED Final   Parainfluenza Virus 2 NOT DETECTED NOT DETECTED Final   Parainfluenza Virus 3 NOT DETECTED NOT DETECTED Final   Parainfluenza Virus 4 NOT DETECTED NOT DETECTED Final   Respiratory Syncytial Virus NOT DETECTED NOT DETECTED Final   Bordetella pertussis NOT DETECTED NOT DETECTED Final   Bordetella Parapertussis NOT DETECTED NOT DETECTED Final   Chlamydophila pneumoniae NOT DETECTED NOT DETECTED Final   Mycoplasma pneumoniae NOT  DETECTED NOT DETECTED Final    Comment: Performed at [facility], [address] [place], [place] [address]     Labs: BNP (last 3 results) Recent Labs    06/08/23 0956  BNP 70.4   Basic Metabolic Panel: Recent Labs  Lab 06/08/23 0956 06/08/23 1045 06/09/23 0359  NA 139 138 138  K 3.5 3.8 4.3  CL 103  --  104  CO2 20*  --  20*  GLUCOSE 196*  --  125*  BUN 17  --  22*  CREATININE 1.36*  --  1.16  CALCIUM 8.9  --  9.3   Liver Function Tests: Recent Labs  Lab 06/08/23 0956  AST 26  ALT 21  ALKPHOS 88  BILITOT 0.7  PROT 7.1  ALBUMIN 4.4   No results for input(s): LIPASE, AMYLASE in the last 168 hours. No results for input(s): AMMONIA in the last 168 hours. CBC: Recent Labs  Lab 06/08/23 0956 06/08/23 1045 06/09/23 0359  WBC 8.0  --  8.1  HGB 14.7 14.3 15.0  HCT 47.1 42.0 47.2  MCV 96.9  --  96.3  PLT 221  --  180   Cardiac Enzymes: No results for input(s): CKTOTAL, CKMB, CKMBINDEX, TROPONINI in the last 168 hours. BNP: Invalid input(s): POCBNP CBG: No results for input(s): GLUCAP in the last 168 hours. D-Dimer No results for input(s): DDIMER in the last 72 hours. Hgb A1c No results for input(s): HGBA1C in the last 72 hours. Lipid Profile No results for input(s): CHOL, HDL, LDLCALC, TRIG, CHOLHDL, LDLDIRECT in the last 72 hours. Thyroid function studies No results for input(s): TSH, T4TOTAL, T3FREE, THYROIDAB in the last 72 hours.  Invalid input(s): FREET3 Anemia work up No results for input(s): VITAMINB12, FOLATE, FERRITIN, TIBC, IRON, RETICCTPCT in the last 72 hours. Urinalysis    Component Value Date/Time   COLORURINE YELLOW 08/31/2017 1016   APPEARANCEUR CLEAR 08/31/2017 1016   LABSPEC 1.020 08/31/2017 1016   PHURINE 7.0 08/31/2017 1016   GLUCOSEU NEGATIVE 08/31/2017 1016   HGBUR NEGATIVE 08/31/2017 1016   BILIRUBINUR Negative 05/06/2019 1128   KETONESUR negative 09/20/2017  0915   KETONESUR NEGATIVE 08/31/2017 1016   PROTEINUR Negative 05/06/2019 1128   PROTEINUR NEGATIVE 08/31/2017 1016   UROBILINOGEN 0.2 05/06/2019 1128   NITRITE Negative 05/06/2019 1128   NITRITE NEGATIVE 08/31/2017 1016   LEUKOCYTESUR Trace (A) 05/06/2019 1128   Sepsis Labs Recent Labs  Lab 06/08/23 0956 06/09/23 0359  WBC 8.0 8.1   Microbiology Recent Results (from the past 240 hours)  Resp panel by RT-PCR (RSV, Flu A&B, Covid) Anterior Nasal Swab     Status: Abnormal   Collection Time: 06/08/23  9:50 AM   Specimen: Anterior Nasal Swab  Result Value Ref Range Status   SARS Coronavirus 2 by RT PCR NEGATIVE NEGATIVE Final   Influenza A by PCR POSITIVE (A) NEGATIVE Final   Influenza B by PCR NEGATIVE NEGATIVE Final    Comment: (NOTE) The Xpert Xpress SARS-CoV-2/FLU/RSV plus assay is intended as an aid in the diagnosis of influenza from Nasopharyngeal swab specimens and should not be used as a sole basis for treatment. Nasal washings and aspirates are unacceptable for Xpert Xpress SARS-CoV-2/FLU/RSV testing.  Fact Sheet for Patients: bloggercourse.com  Fact Sheet for Healthcare Providers: seriousbroker.it  This test is not yet approved or cleared by the [place]  FDA and has been authorized for detection and/or diagnosis of SARS-CoV-2 by FDA under an Emergency Use Authorization (EUA). This EUA will remain in effect (meaning this test can be used) for the duration of the COVID-19 declaration under Section 564(b)(1) of the Act, 21 U.S.C. section 360bbb-3(b)(1), unless the authorization is terminated or revoked.     Resp Syncytial Virus by PCR NEGATIVE NEGATIVE Final    Comment: (NOTE) Fact Sheet for Patients: bloggercourse.com  Fact Sheet for Healthcare Providers: seriousbroker.it  This test is not yet approved or cleared by the [place]  FDA and has been  authorized for detection and/or diagnosis of SARS-CoV-2 by FDA under an Emergency Use Authorization (EUA). This EUA will remain in effect (meaning this test can be used)  for the duration of the COVID-19 declaration under Section 564(b)(1) of the Act, 21 U.S.C. section 360bbb-3(b)(1), unless the authorization is terminated or revoked.  Performed at [facility], [address] [place], [place] [address]   Respiratory (~20 pathogens) panel by PCR     Status: Abnormal   Collection Time: 06/08/23  9:50 AM   Specimen: Anterior Nasal Swab; Respiratory  Result Value Ref Range Status   Adenovirus NOT DETECTED NOT DETECTED Final   Coronavirus 229E NOT DETECTED NOT DETECTED Final    Comment: (NOTE) The Coronavirus on the Respiratory Panel, DOES NOT test for the novel  Coronavirus (2019 nCoV)    Coronavirus HKU1 NOT DETECTED NOT DETECTED Final   Coronavirus NL63 NOT DETECTED NOT DETECTED Final   Coronavirus OC43 NOT DETECTED NOT DETECTED Final   Metapneumovirus NOT DETECTED NOT DETECTED Final   Rhinovirus / Enterovirus NOT DETECTED NOT DETECTED Final   Influenza A H1 2009 DETECTED (A) NOT DETECTED Final   Influenza B NOT DETECTED NOT DETECTED Final   Parainfluenza Virus 1 NOT DETECTED NOT DETECTED Final   Parainfluenza Virus 2 NOT DETECTED NOT DETECTED Final   Parainfluenza Virus 3 NOT DETECTED NOT DETECTED Final   Parainfluenza Virus 4 NOT DETECTED NOT DETECTED Final   Respiratory Syncytial Virus NOT DETECTED NOT DETECTED Final   Bordetella pertussis NOT DETECTED NOT DETECTED Final   Bordetella Parapertussis NOT DETECTED NOT DETECTED Final   Chlamydophila pneumoniae NOT DETECTED NOT DETECTED Final   Mycoplasma pneumoniae NOT DETECTED NOT DETECTED Final    Comment: Performed at [facility], [address], [place], [place] [address]     Time coordinating discharge:  35 minutes  SIGNED:   Renato Applebaum, MD  [facility] 06/09/2023, 11:43 AM

## 2023-06-09 NOTE — ED Notes (Signed)
 Patient noted with productive cough, whitish color sputum. MD notified new order given.

## 2023-06-09 NOTE — ED Notes (Signed)
 Patient stated he could not wear the cpap.

## 2023-06-12 ENCOUNTER — Telehealth: Payer: Self-pay | Admitting: *Deleted

## 2023-06-12 NOTE — Transitions of Care (Post Inpatient/ED Visit) (Signed)
   06/12/2023  Name: Aaron [name] MRN: [id number] DOB: 1970-10-26  Today's TOC FU Call Status: Today's TOC FU Call Status:: Unsuccessful Call (1st Attempt) Unsuccessful Call (1st Attempt) Date: 06/12/23  Attempted to reach the patient regarding the most recent Inpatient/ED visit.  Follow Up Plan: Additional outreach attempts will be made to reach the patient to complete the Transitions of Care (Post Inpatient/ED visit) call.   Una Ganser BSN RN [facility] Health Care Management Coordinator [email or web address] Direct Dial: [phone]  Fax: [phone] Website: [email or web address]

## 2023-06-13 NOTE — Telephone Encounter (Signed)
Patient has received two voicemails. Closing per office policy

## 2024-04-30 ENCOUNTER — Emergency Department (HOSPITAL_COMMUNITY)
Admission: EM | Admit: 2024-04-30 | Discharge: 2024-05-01 | Disposition: A | Discharge status: Home / Self Care | Payer: Self-pay | Source: Home / Self Care | Attending: Emergency Medicine | Admitting: Emergency Medicine

## 2024-04-30 ENCOUNTER — Emergency Department (HOSPITAL_COMMUNITY): Payer: Self-pay

## 2024-04-30 ENCOUNTER — Other Ambulatory Visit: Payer: Self-pay

## 2024-04-30 DIAGNOSIS — J069 Acute upper respiratory infection, unspecified: Secondary | ICD-10-CM | POA: Insufficient documentation

## 2024-04-30 DIAGNOSIS — J441 Chronic obstructive pulmonary disease with (acute) exacerbation: Secondary | ICD-10-CM | POA: Insufficient documentation

## 2024-04-30 DIAGNOSIS — Z7951 Long term (current) use of inhaled steroids: Secondary | ICD-10-CM | POA: Insufficient documentation

## 2024-04-30 LAB — CBC
HCT: 45.2 % (ref 39.0–52.0)
Hemoglobin: 14.9 g/dL (ref 13.0–17.0)
MCH: 30.5 pg (ref 26.0–34.0)
MCHC: 33 g/dL (ref 30.0–36.0)
MCV: 92.6 fL (ref 80.0–100.0)
Platelets: 214 K/uL (ref 150–400)
RBC: 4.88 MIL/uL (ref 4.22–5.81)
RDW: 13.4 % (ref 11.5–15.5)
WBC: 8.2 K/uL (ref 4.0–10.5)
nRBC: 0 % (ref 0.0–0.2)

## 2024-04-30 LAB — BASIC METABOLIC PANEL WITH GFR
Anion gap: 11 (ref 5–15)
BUN: 18 mg/dL (ref 6–20)
CO2: 25 mmol/L (ref 22–32)
Calcium: 9.2 mg/dL (ref 8.9–10.3)
Chloride: 104 mmol/L (ref 98–111)
Creatinine, Ser: 1.08 mg/dL (ref 0.61–1.24)
GFR, Estimated: >60 mL/min
Glucose, Bld: 137 mg/dL — ABNORMAL HIGH (ref 70–99)
Potassium: 4.1 mmol/L (ref 3.5–5.1)
Sodium: 140 mmol/L (ref 135–145)

## 2024-04-30 LAB — PRO BRAIN NATRIURETIC PEPTIDE: Pro Brain Natriuretic Peptide: <50 pg/mL

## 2024-04-30 LAB — PROTIME-INR
INR: 1 (ref 0.8–1.2)
Prothrombin Time: 13.9 s (ref 11.4–15.2)

## 2024-04-30 LAB — TROPONIN T, HIGH SENSITIVITY: Troponin T High Sensitivity: 30 ng/L — ABNORMAL HIGH (ref 0–19)

## 2024-04-30 NOTE — ED Triage Notes (Signed)
 Patient reports central chest pain with SOB /chest congestion with cough and emesis this week , denies fever or chills .

## 2024-05-01 ENCOUNTER — Other Ambulatory Visit: Payer: Self-pay

## 2024-05-01 ENCOUNTER — Encounter (HOSPITAL_COMMUNITY): Payer: Self-pay

## 2024-05-01 LAB — TROPONIN T, HIGH SENSITIVITY: Troponin T High Sensitivity: 26 ng/L — ABNORMAL HIGH (ref 0–19)

## 2024-05-01 MED ORDER — PREDNISONE 20 MG PO TABS
60.0000 mg | ORAL_TABLET | Freq: Every day | ORAL | 0 refills | Status: AC
  Filled 2024-05-01: qty 12, 4d supply, fill #0

## 2024-05-01 MED ORDER — ALBUTEROL SULFATE HFA 108 (90 BASE) MCG/ACT IN AERS
2.0000 | INHALATION_SPRAY | RESPIRATORY_TRACT | 3 refills | Status: AC | PRN
  Filled 2024-05-01: qty 6.7, 17d supply, fill #0

## 2024-05-01 MED ORDER — METHYLPREDNISOLONE SODIUM SUCC 125 MG IJ SOLR
125.0000 mg | Freq: Once | INTRAMUSCULAR | Status: AC
  Administered 2024-05-01: 125 mg via INTRAVENOUS
  Filled 2024-05-01: qty 2

## 2024-05-01 MED ORDER — ALBUTEROL SULFATE (2.5 MG/3ML) 0.083% IN NEBU
5.0000 mg | INHALATION_SOLUTION | Freq: Once | RESPIRATORY_TRACT | Status: AC
  Administered 2024-05-01: 5 mg via RESPIRATORY_TRACT
  Filled 2024-05-01: qty 6

## 2024-05-01 MED ORDER — ALBUTEROL SULFATE (2.5 MG/3ML) 0.083% IN NEBU
10.0000 mg/h | INHALATION_SOLUTION | Freq: Once | RESPIRATORY_TRACT | Status: AC
  Administered 2024-05-01: 10 mg/h via RESPIRATORY_TRACT
  Filled 2024-05-01: qty 12
  Filled 2024-05-01: qty 3

## 2024-05-01 MED ORDER — ACETAMINOPHEN 325 MG PO TABS
650.0000 mg | ORAL_TABLET | Freq: Once | ORAL | Status: AC
  Administered 2024-05-01: 650 mg via ORAL
  Filled 2024-05-01: qty 2

## 2024-05-01 MED ORDER — ALBUTEROL SULFATE (2.5 MG/3ML) 0.083% IN NEBU
2.5000 mg | INHALATION_SOLUTION | Freq: Four times a day (QID) | RESPIRATORY_TRACT | 1 refills | Status: AC | PRN
  Filled 2024-05-01: qty 75, 7d supply, fill #0

## 2024-05-01 MED ORDER — ALBUTEROL SULFATE HFA 108 (90 BASE) MCG/ACT IN AERS
2.0000 | INHALATION_SPRAY | Freq: Once | RESPIRATORY_TRACT | Status: AC
  Administered 2024-05-01: 2 via RESPIRATORY_TRACT
  Filled 2024-05-01: qty 6.7

## 2024-05-01 MED ORDER — ACETAMINOPHEN 500 MG PO TABS
1000.0000 mg | ORAL_TABLET | Freq: Once | ORAL | Status: AC
  Administered 2024-05-01: 1000 mg via ORAL
  Filled 2024-05-01: qty 2

## 2024-05-01 NOTE — Discharge Instructions (Addendum)
 It was our pleasure to provide your ER care today - we hope that you feel better.  Take prednisone  as prescribed. Use albuterol  inhaler or nebulizer treatment as need. Avoid any smoking  and/or smoky environments, and/or exposure to allergens that you feel exacerbate your asthma (such as pet hair/dander, pollen, etc.)  Follow up closely with primary care doctor in the next few days if symptoms fail to improve/resolve.  Return to ER right away  if worse, new symptoms, fevers, increased trouble breathing, chest pain, or other concern.

## 2024-05-01 NOTE — ED Provider Notes (Signed)
 " [place] EMERGENCY DEPARTMENT AT [facility] Provider Note   CSN: [id number] Arrival date & time: 04/30/24  2202     Patient presents with: Chest Pain and Shortness of Breath   Aaron [name] is a 53 y.o. male.   Pt complains of a cough and congestion. Pt reports he has a history of Copd.  Pt reports feeling short of breath this am.    The history is provided by the patient. No language interpreter was used.  Chest Pain Pain quality: aching   Pain radiates to:  Does not radiate Pain severity:  Moderate Timing:  Constant Progression:  Worsening Chronicity:  New Relieved by:  Nothing Associated symptoms: shortness of breath   Risk factors: no hypertension   Shortness of Breath Severity:  Moderate Timing:  Constant Progression:  Worsening Chronicity:  New Relieved by:  Nothing Worsened by:  Nothing Associated symptoms: chest pain        Prior to Admission medications  Medication Sig Start Date End Date Taking? Authorizing Provider  albuterol  (PROVENTIL ) (2.5 MG/3ML) 0.083% nebulizer solution TAKE 3 MLS (2.5 MG TOTAL) BY NEBULIZATION 3 (THREE) TIMES DAILY. Patient taking differently: Take 2.5 mg by nebulization daily as needed for shortness of breath or wheezing. 11/23/20  Yes Maree, Pratik D, DO  albuterol  (VENTOLIN  HFA) 108 (90 Base) MCG/ACT inhaler INHALE 2 PUFFS INTO THE LUNGS EVERY 4 (FOUR) HOURS AS NEEDED FOR WHEEZING OR SHORTNESS OF BREATH. 11/23/20 05/01/24 Yes Shah, Pratik D, DO  busPIRone  (BUSPAR ) 15 MG tablet Take 1 tablet (15 mg total) by mouth 2 (two) times daily. Patient taking differently: Take 15 mg by mouth 2 (two) times daily as needed (anxiety). 05/06/19  Yes Stroud, [name], [name]  cholecalciferol  (VITAMIN D3) 25 MCG (1000 UNIT) tablet Take 1,000 Units by mouth every other day.   Yes [provider]  diphenhydrAMINE  (BENADRYL ) 25 mg capsule Take 25 mg by mouth daily as needed for allergies.   Yes [provider]   Fluticasone -Umeclidin-Vilant (TRELEGY ELLIPTA ) 100-62.5-25 MCG/ACT AEPB Inhale 1 puff into the lungs daily. Patient not taking: Reported on 06/08/2023 05/06/22   Hope Almarie ORN, NP    Allergies: Keflex  [cephalexin ]    Review of Systems  Respiratory:  Positive for shortness of breath.   Cardiovascular:  Positive for chest pain.  All other systems reviewed and are negative.   Updated Vital Signs BP (!) 146/98   Pulse 88   Temp 98 F (36.7 C)   Resp 15   SpO2 100%   Physical Exam Vitals and nursing note reviewed.  Constitutional:      Appearance: He is well-developed.  HENT:     Head: Normocephalic.  Cardiovascular:     Rate and Rhythm: Normal rate and regular rhythm.     Heart sounds: Normal heart sounds.  Pulmonary:     Effort: Pulmonary effort is normal.  Abdominal:     General: There is no distension.     Palpations: Abdomen is soft.  Musculoskeletal:        General: Normal range of motion.     Cervical back: Normal range of motion.  Skin:    General: Skin is warm.  Neurological:     General: No focal deficit present.     Mental Status: He is alert and oriented to person, place, and time.     (all labs ordered are listed, but only abnormal results are displayed) Labs Reviewed  BASIC METABOLIC PANEL WITH GFR - Abnormal; Notable  for the following components:      Result Value   Glucose, Bld 137 (*)    All other components within normal limits  TROPONIN T, HIGH SENSITIVITY - Abnormal; Notable for the following components:   Troponin T High Sensitivity 30 (*)    All other components within normal limits  TROPONIN T, HIGH SENSITIVITY - Abnormal; Notable for the following components:   Troponin T High Sensitivity 26 (*)    All other components within normal limits  CBC  PROTIME-INR  PRO BRAIN NATRIURETIC PEPTIDE    EKG: None  Radiology: DG Chest 2 View Result Date: 04/30/2024 CLINICAL DATA:  Chest pain and shortness of breath. EXAM: CHEST - 2 VIEW  COMPARISON:  06/08/2023 FINDINGS: The cardiomediastinal contours are normal. The lungs are hyperinflated. Pulmonary vasculature is normal. No consolidation, pleural effusion, or pneumothorax. No acute osseous abnormalities are seen. IMPRESSION: Hyperinflation, can be seen with asthma or bronchitis. Electronically Signed   By: Andrea Gasman M.D.   On: 04/30/2024 23:18     Procedures   Medications Ordered in the ED  albuterol  (VENTOLIN  HFA) 108 (90 Base) MCG/ACT inhaler 2 puff (2 puffs Inhalation Given 05/01/24 0259)  albuterol  (PROVENTIL ) (2.5 MG/3ML) 0.083% nebulizer solution 5 mg (5 mg Nebulization Given 05/01/24 0552)  albuterol  (PROVENTIL ) (2.5 MG/3ML) 0.083% nebulizer solution (10 mg/hr Nebulization Given 05/01/24 0729)  methylPREDNISolone  sodium succinate (SOLU-MEDROL ) 125 mg/2 mL injection 125 mg (125 mg Intravenous Given 05/01/24 0649)                                    Medical Decision Making Pt complains of feeling short of breath.    Amount and/or Complexity of Data Reviewed Labs: ordered. Decision-making details documented in ED Course. Radiology: ordered.    Details: Chest xray  hyperinflation  ECG/medicine tests: ordered and independent interpretation performed. Decision-making details documented in ED Course.    Details: EKG  sinus tach, normal EKG.  Risk Prescription drug management.        Final diagnoses:  URI with cough and congestion  COPD exacerbation [facility])    ED Discharge Orders     None      Pt seen by Dr. Bernard.     Flint Sonny POUR, PA-C 05/01/24 9073    Bernard Drivers, MD 05/01/24 1419  "

## 2024-05-01 NOTE — ED Provider Notes (Signed)
 Pt signed out by earlier team, with non prod cough, congestion, wheezing.   Recheck pt - no chest pain or discomfort. Is breathing comfortably. No wheezing or increased wob. Pt indicates feels ready for d/c.   Rx provided.  Pt currently appears stable for ed d/c.   Return precautions provided.    Bernard Drivers, MD 05/01/24 [phone]

## 2024-05-10 ENCOUNTER — Other Ambulatory Visit: Payer: Self-pay

## 2024-05-20 ENCOUNTER — Other Ambulatory Visit: Payer: Self-pay
